# Patient Record
Sex: Male | Born: 1970 | Race: Black or African American | Hispanic: No | Marital: Married | Smoking: Never smoker
Health system: Southern US, Community
[De-identification: ages and names within clinical notes are randomized; demographics above are authoritative.]

## PROBLEM LIST (undated history)

## (undated) DIAGNOSIS — Z8709 Personal history of other diseases of the respiratory system: Secondary | ICD-10-CM

## (undated) DIAGNOSIS — I10 Essential (primary) hypertension: Secondary | ICD-10-CM

## (undated) DIAGNOSIS — IMO0001 Reserved for inherently not codable concepts without codable children: Secondary | ICD-10-CM

## (undated) DIAGNOSIS — M2550 Pain in unspecified joint: Secondary | ICD-10-CM

## (undated) DIAGNOSIS — Z8739 Personal history of other diseases of the musculoskeletal system and connective tissue: Secondary | ICD-10-CM

## (undated) DIAGNOSIS — M1990 Unspecified osteoarthritis, unspecified site: Secondary | ICD-10-CM

## (undated) DIAGNOSIS — I509 Heart failure, unspecified: Secondary | ICD-10-CM

## (undated) HISTORY — PX: TRICEPS TENDON REPAIR: SHX2577

## (undated) HISTORY — DX: Heart failure, unspecified: I50.9

## (undated) HISTORY — DX: Essential (primary) hypertension: I10

## (undated) HISTORY — DX: Reserved for inherently not codable concepts without codable children: IMO0001

---

## 2000-04-27 ENCOUNTER — Encounter: Admission: RE | Admit: 2000-04-27 | Discharge: 2000-04-27 | Payer: Self-pay | Admitting: *Deleted

## 2000-04-27 ENCOUNTER — Encounter: Payer: Self-pay | Admitting: *Deleted

## 2013-10-17 ENCOUNTER — Encounter (HOSPITAL_COMMUNITY): Payer: Self-pay

## 2013-10-18 ENCOUNTER — Encounter (HOSPITAL_COMMUNITY): Payer: Self-pay

## 2013-10-19 ENCOUNTER — Encounter: Payer: Self-pay | Admitting: Interventional Cardiology

## 2014-07-23 ENCOUNTER — Ambulatory Visit: Payer: Self-pay | Admitting: Interventional Cardiology

## 2014-08-08 ENCOUNTER — Encounter: Payer: Self-pay | Admitting: Cardiology

## 2014-08-08 ENCOUNTER — Ambulatory Visit: Payer: Self-pay | Admitting: Interventional Cardiology

## 2014-08-08 DIAGNOSIS — E669 Obesity, unspecified: Secondary | ICD-10-CM

## 2014-08-08 DIAGNOSIS — I10 Essential (primary) hypertension: Secondary | ICD-10-CM | POA: Insufficient documentation

## 2014-08-08 DIAGNOSIS — I509 Heart failure, unspecified: Secondary | ICD-10-CM | POA: Insufficient documentation

## 2014-08-31 ENCOUNTER — Encounter: Payer: Self-pay | Admitting: *Deleted

## 2014-12-18 ENCOUNTER — Encounter: Payer: Self-pay | Admitting: Interventional Cardiology

## 2015-08-19 ENCOUNTER — Other Ambulatory Visit: Payer: Self-pay | Admitting: *Deleted

## 2015-08-19 ENCOUNTER — Ambulatory Visit: Payer: Self-pay | Admitting: Interventional Cardiology

## 2015-08-21 ENCOUNTER — Encounter: Payer: Self-pay | Admitting: Interventional Cardiology

## 2016-08-05 ENCOUNTER — Ambulatory Visit: Payer: Self-pay | Admitting: Orthopedic Surgery

## 2016-08-20 NOTE — Pre-Procedure Instructions (Signed)
Andre Hoffman  08/20/2016     No Pharmacies Listed   Your procedure is scheduled on Mon, Sept 11 @ 7:30 AM  Report to [facility] Admitting at 5:30 AM  Call this number if you have problems the morning of surgery:  [phone]   Remember:  Do not eat food or drink liquids after midnight.  Take these medicines the morning of surgery with A SIP OF WATER Carvedilol(Coreg)              Stop taking your Aspirin along with any Vitamins or Herbal Medications. No Goody's,BC's,Aleve,Advil,Motrin,Ibuprofen,Fish Oil,or any Herbal Medications.    Do not wear jewelry.  Do not wear lotions, powders, or colognes, or deoderant.  Men may shave face and neck.  Do not bring valuables to the hospital.  [facility] is not responsible for any belongings or valuables.  Contacts, dentures or bridgework may not be worn into surgery.  Leave your suitcase in the car.  After surgery it may be brought to your room.  For patients admitted to the hospital, discharge time will be determined by your treatment team.  Patients discharged the day of surgery will not be allowed to drive home.    [facility]  Before surgery, you can play an important role.  Because skin is not sterile, your skin needs to be as free of germs as possible.  You can reduce the number of germs on you skin by washing with CHG (chlorahexidine gluconate) soap before surgery.  CHG is an antiseptic cleaner which kills germs and bonds with the skin to continue killing germs even after washing.  Please DO NOT use if you have an allergy to CHG or antibacterial soaps.  If your skin becomes reddened/irritated stop using the CHG and inform your nurse when you arrive at Short Stay.  Do not shave (including legs and underarms) for at least 48 hours prior to the first CHG shower.  You may shave your face.  Please follow these instructions carefully:   1.  Shower with CHG Soap the night before  surgery and the                                morning of Surgery.  2.  If you choose to wash your hair, wash your hair first as usual with your       normal shampoo.  3.  After you shampoo, rinse your hair and body thoroughly to remove the                      Shampoo.  4.  Use CHG as you would any other liquid soap.  You can apply chg directly       to the skin and wash gently with scrungie or a clean washcloth.  5.  Apply the CHG Soap to your body ONLY FROM THE NECK DOWN.        Do not use on open wounds or open sores.  Avoid contact with your eyes,       ears, mouth and genitals (private parts).  Wash genitals (private parts)       with your normal soap.  6.  Wash thoroughly, paying special attention to the area where your surgery        will be performed.  7.  Thoroughly rinse your body with warm water from  the neck down.  8.  DO NOT shower/wash with your normal soap after using and rinsing off       the CHG Soap.  9.  Pat yourself dry with a clean towel.            10.  Wear clean pajamas.            11.  Place clean sheets on your bed the night of your first shower and do not        sleep with pets.  Day of Surgery  Do not apply any lotions/deoderants the morning of surgery.  Please wear clean clothes to the hospital/surgery center.   Please read over the following fact sheets that you were given. Pain Booklet, MRSA Information and Surgical Site Infection Prevention

## 2016-08-21 ENCOUNTER — Encounter (HOSPITAL_COMMUNITY)
Admission: RE | Admit: 2016-08-21 | Discharge: 2016-08-21 | Disposition: A | Discharge status: Home / Self Care | Payer: 59 | Source: Ambulatory Visit | Attending: Orthopedic Surgery | Admitting: Orthopedic Surgery

## 2016-08-21 ENCOUNTER — Encounter (HOSPITAL_COMMUNITY): Payer: Self-pay

## 2016-08-21 ENCOUNTER — Other Ambulatory Visit: Payer: Self-pay

## 2016-08-21 DIAGNOSIS — M1612 Unilateral primary osteoarthritis, left hip: Secondary | ICD-10-CM | POA: Insufficient documentation

## 2016-08-21 DIAGNOSIS — R9431 Abnormal electrocardiogram [ECG] [EKG]: Secondary | ICD-10-CM | POA: Insufficient documentation

## 2016-08-21 DIAGNOSIS — Z01812 Encounter for preprocedural laboratory examination: Secondary | ICD-10-CM | POA: Diagnosis not present

## 2016-08-21 DIAGNOSIS — Z01818 Encounter for other preprocedural examination: Secondary | ICD-10-CM | POA: Insufficient documentation

## 2016-08-21 DIAGNOSIS — Z0183 Encounter for blood typing: Secondary | ICD-10-CM | POA: Diagnosis not present

## 2016-08-21 HISTORY — DX: Pain in unspecified joint: M25.50

## 2016-08-21 HISTORY — DX: Personal history of other diseases of the respiratory system: Z87.09

## 2016-08-21 HISTORY — DX: Unspecified osteoarthritis, unspecified site: M19.90

## 2016-08-21 HISTORY — DX: Personal history of other diseases of the musculoskeletal system and connective tissue: Z87.39

## 2016-08-21 LAB — CBC
HCT: 38.3 % — ABNORMAL LOW (ref 39.0–52.0)
HEMOGLOBIN: 12.7 g/dL — AB (ref 13.0–17.0)
MCH: 32.6 pg (ref 26.0–34.0)
MCHC: 33.2 g/dL (ref 30.0–36.0)
MCV: 98.2 fL (ref 78.0–100.0)
Platelets: 350 10*3/uL (ref 150–400)
RBC: 3.9 MIL/uL — AB (ref 4.22–5.81)
RDW: 13.4 % (ref 11.5–15.5)
WBC: 4.9 10*3/uL (ref 4.0–10.5)

## 2016-08-21 LAB — BASIC METABOLIC PANEL
ANION GAP: 11 (ref 5–15)
BUN: 8 mg/dL (ref 6–20)
CALCIUM: 9.2 mg/dL (ref 8.9–10.3)
CHLORIDE: 107 mmol/L (ref 101–111)
CO2: 22 mmol/L (ref 22–32)
CREATININE: 0.99 mg/dL (ref 0.61–1.24)
GFR calc non Af Amer: >60 mL/min (ref >60)
GLUCOSE: 118 mg/dL — AB (ref 65–99)
Potassium: 3.6 mmol/L (ref 3.5–5.1)
Sodium: 140 mmol/L (ref 135–145)

## 2016-08-21 LAB — SURGICAL PCR SCREEN
MRSA, PCR: NEGATIVE
STAPHYLOCOCCUS AUREUS: NEGATIVE

## 2016-08-21 LAB — TYPE AND SCREEN
ABO/RH(D): O POS
ANTIBODY SCREEN: NEGATIVE

## 2016-08-21 LAB — ABO/RH: ABO/RH(D): O POS

## 2016-08-21 MED ORDER — CHLORHEXIDINE GLUCONATE 4 % EX LIQD: 60.0000 mL | Freq: Once | CUTANEOUS | Status: DC

## 2016-08-21 NOTE — Progress Notes (Signed)
Saw a cardiologist in 2014 d/t edema.Only saw one time-Dr.Varanasi  Medical Md is Dr.Husain  Stress test denies   Echo report in epic from 2014  Heart cath denies  EKG denies in past yr  CXR denies in past yr

## 2016-08-25 ENCOUNTER — Ambulatory Visit: Payer: Self-pay | Admitting: Orthopedic Surgery

## 2016-08-25 NOTE — H&P (Signed)
TOTAL HIP ADMISSION H&P  Patient is admitted for left total hip arthroplasty.  Subjective:  Chief Complaint: left hip pain  HPI: Andre Hoffman, 45 y.o. male, has a history of pain and functional disability in the left hip(s) due to arthritis and patient has failed non-surgical conservative treatments for greater than 12 weeks to include NSAID's and/or analgesics, flexibility and strengthening excercises, use of assistive devices, weight reduction as appropriate and activity modification.  Onset of symptoms was gradual starting 2 years ago with rapidlly worsening course since that time.The patient noted no past surgery on the left hip(s).  Patient currently rates pain in the left hip at 10 out of 10 with activity. Patient has night pain, worsening of pain with activity and weight bearing, pain that interfers with activities of daily living, pain with passive range of motion and crepitus. Patient has evidence of subchondral cysts, subchondral sclerosis, periarticular osteophytes and joint space narrowing by imaging studies. This condition presents safety issues increasing the risk of falls. There is no current active infection.  Patient Active Problem List   Diagnosis Date Noted  . Essential hypertension, benign 08/08/2014  . Obesity, unspecified 08/08/2014  . Congestive heart failure, unspecified 08/08/2014   Past Medical History:  Diagnosis Date  . Arthritis   . CHF (congestive heart failure) (HCC)    Echo- 2014- EF 40-45%; mild LVH.Takes Lasix daily  . History of bronchitis    3 yrs ago  . History of gout    only once   . Hypertension    takes Coreg and Losartan daily  . Joint pain   . White coat hypertension     Past Surgical History:  Procedure Laterality Date  . TRICEPS TENDON REPAIR Bilateral      (Not in a hospital admission) Allergies  Allergen Reactions  . Lisinopril Swelling  . Penicillins Swelling    Social History  Substance Use Topics  . Smoking status: Never  Smoker  . Smokeless tobacco: Never Used  . Alcohol use Yes     Comment: occ beer    Family History  Problem Relation Age of Onset  . Other Father     MVA  . Hypertension Paternal Grandfather   . Other Paternal Grandfather     cardiac arrest  . Diabetes Paternal Grandfather   . Heart attack Paternal Grandfather      Review of Systems  Constitutional: Negative.   HENT: Negative.   Eyes: Negative.   Respiratory: Negative.   Cardiovascular: Negative.   Gastrointestinal: Negative.   Genitourinary: Negative.   Musculoskeletal: Positive for joint pain.  Skin: Negative.   Neurological: Negative.   Endo/Heme/Allergies: Negative.   Psychiatric/Behavioral: Negative.     Objective:  Physical Exam  Vitals reviewed. Constitutional: He is oriented to person, place, and time. He appears well-developed and well-nourished.  HENT:  Head: Normocephalic and atraumatic.  Eyes: Conjunctivae and EOM are normal. Pupils are equal, round, and reactive to light.  Neck: Normal range of motion. Neck supple.  Cardiovascular: Normal rate, regular rhythm and intact distal pulses.   Respiratory: Effort normal. No respiratory distress.  GI: Soft. Bowel sounds are normal. He exhibits no distension.  Genitourinary:  Genitourinary Comments: deferred  Musculoskeletal:       Left hip: He exhibits decreased range of motion, decreased strength and crepitus.  Neurological: He is alert and oriented to person, place, and time. He has normal reflexes.  Skin: Skin is warm and dry.  Psychiatric: He has a normal mood and  affect. His behavior is normal. Judgment and thought content normal.    Vital signs in last 24 hours: @VSRANGES @  Labs:   Estimated body mass index is 34.16 kg/m as calculated from the following:   Height as of 08/21/16: 6\' 3"  (1.905 m).   Weight as of 08/21/16: 124 kg (273 lb 5 oz).   Imaging Review Plain radiographs demonstrate severe degenerative joint disease of the left hip(s). The  bone quality appears to be adequate for age and reported activity level.  Assessment/Plan:  End stage arthritis, left hip(s)  The patient history, physical examination, clinical judgement of the provider and imaging studies are consistent with end stage degenerative joint disease of the left hip(s) and total hip arthroplasty is deemed medically necessary. The treatment options including medical management, injection therapy, arthroscopy and arthroplasty were discussed at length. The risks and benefits of total hip arthroplasty were presented and reviewed. The risks due to aseptic loosening, infection, stiffness, dislocation/subluxation,  thromboembolic complications and other imponderables were discussed.  The patient acknowledged the explanation, agreed to proceed with the plan and consent was signed. Patient is being admitted for inpatient treatment for surgery, pain control, PT, OT, prophylactic antibiotics, VTE prophylaxis, progressive ambulation and ADL's and discharge planning.The patient is planning to be discharged home with home health services

## 2016-08-28 MED ORDER — ACETAMINOPHEN 10 MG/ML IV SOLN
1000.0000 mg | INTRAVENOUS | Status: AC
  Administered 2016-08-31: 1000 mg via INTRAVENOUS

## 2016-08-28 MED ORDER — TRANEXAMIC ACID 1000 MG/10ML IV SOLN
1000.0000 mg | INTRAVENOUS | Status: AC
  Administered 2016-08-31: 1000 mg via INTRAVENOUS
  Filled 2016-08-28: qty 10

## 2016-08-28 MED ORDER — CLINDAMYCIN PHOSPHATE 900 MG/50ML IV SOLN
900.0000 mg | INTRAVENOUS | Status: AC
Start: 2016-08-31 — End: 2016-08-31
  Administered 2016-08-31: 900 mg via INTRAVENOUS
  Filled 2016-08-28: qty 50

## 2016-08-30 NOTE — Anesthesia Preprocedure Evaluation (Addendum)
Anesthesia Evaluation  Patient identified by MRN, date of birth, ID band Patient awake    Reviewed: Allergy & Precautions, NPO status , Patient's Chart, lab work & pertinent test results, reviewed documented beta blocker date and time   Airway Mallampati: I  TM Distance: >3 FB Neck ROM: Full    Dental  (+)    Pulmonary neg pulmonary ROS,    breath sounds clear to auscultation       Cardiovascular hypertension, Pt. on medications and Pt. on home beta blockers (-) angina+CHF (EF 40-45% on TTE 04/18/2013)  (-) Past MI and (-) Cardiac Stents  Rhythm:Regular Rate:Normal     Neuro/Psych    GI/Hepatic negative GI ROS, Neg liver ROS,   Endo/Other  negative endocrine ROS  Renal/GU negative Renal ROS     Musculoskeletal  (+) Arthritis ,   Abdominal (+) + obese,   Peds  Hematology negative hematology ROS (+)   Anesthesia Other Findings gout  Reproductive/Obstetrics                            Anesthesia Physical Anesthesia Plan  ASA: III  Anesthesia Plan: Spinal   Post-op Pain Management:    Induction: Intravenous  Airway Management Planned: Natural Airway and Simple Face Mask  Additional Equipment:   Intra-op Plan:   Post-operative Plan:   Informed Consent: I have reviewed the patients History and Physical, chart, labs and discussed the procedure including the risks, benefits and alternatives for the proposed anesthesia with the patient or authorized representative who has indicated his/her understanding and acceptance.   Dental advisory given  Plan Discussed with:   Anesthesia Plan Comments: (I have discussed risks of neuraxial anesthesia including but not limited to infection, bleeding, nerve injury, back pain, headache, seizures, and failure of block. Patient denies bleeding disorders and is not currently anticoagulated. Labs have been reviewed. Risks and benefits discussed. Discussed  possible backup GA. All patient's questions answered.   Platelets 350)       Anesthesia Quick Evaluation

## 2016-08-31 ENCOUNTER — Encounter (HOSPITAL_COMMUNITY)
Admission: RE | Disposition: A | Discharge status: Home Health | Payer: Self-pay | Source: Ambulatory Visit | Attending: Orthopedic Surgery

## 2016-08-31 ENCOUNTER — Inpatient Hospital Stay (HOSPITAL_COMMUNITY): Payer: 59 | Admitting: Anesthesiology

## 2016-08-31 ENCOUNTER — Encounter (HOSPITAL_COMMUNITY): Payer: Self-pay | Admitting: *Deleted

## 2016-08-31 ENCOUNTER — Inpatient Hospital Stay (HOSPITAL_COMMUNITY): Payer: 59

## 2016-08-31 ENCOUNTER — Inpatient Hospital Stay (HOSPITAL_COMMUNITY)
Admission: RE | Admit: 2016-08-31 | Discharge: 2016-09-02 | DRG: 470 | Disposition: A | Discharge status: Home Health | Payer: 59 | Source: Ambulatory Visit | Attending: Orthopedic Surgery | Admitting: Orthopedic Surgery

## 2016-08-31 DIAGNOSIS — I509 Heart failure, unspecified: Secondary | ICD-10-CM | POA: Diagnosis present

## 2016-08-31 DIAGNOSIS — M1612 Unilateral primary osteoarthritis, left hip: Principal | ICD-10-CM | POA: Diagnosis present

## 2016-08-31 DIAGNOSIS — Z09 Encounter for follow-up examination after completed treatment for conditions other than malignant neoplasm: Secondary | ICD-10-CM

## 2016-08-31 DIAGNOSIS — I110 Hypertensive heart disease with heart failure: Secondary | ICD-10-CM | POA: Diagnosis present

## 2016-08-31 DIAGNOSIS — E669 Obesity, unspecified: Secondary | ICD-10-CM | POA: Diagnosis present

## 2016-08-31 DIAGNOSIS — Z79899 Other long term (current) drug therapy: Secondary | ICD-10-CM

## 2016-08-31 DIAGNOSIS — Z419 Encounter for procedure for purposes other than remedying health state, unspecified: Secondary | ICD-10-CM

## 2016-08-31 DIAGNOSIS — Z6834 Body mass index (BMI) 34.0-34.9, adult: Secondary | ICD-10-CM | POA: Diagnosis not present

## 2016-08-31 HISTORY — PX: TOTAL HIP ARTHROPLASTY: SHX124

## 2016-08-31 SURGERY — ARTHROPLASTY, HIP, TOTAL, ANTERIOR APPROACH
Anesthesia: Spinal | Site: Hip | Laterality: Left

## 2016-08-31 MED ORDER — ONDANSETRON HCL 4 MG/2ML IJ SOLN
INTRAMUSCULAR | Status: DC | PRN
  Administered 2016-08-31: 4 mg via INTRAVENOUS

## 2016-08-31 MED ORDER — SUGAMMADEX SODIUM 500 MG/5ML IV SOLN
INTRAVENOUS | Status: AC
  Filled 2016-08-31: qty 5

## 2016-08-31 MED ORDER — DOCUSATE SODIUM 100 MG PO CAPS
100.0000 mg | ORAL_CAPSULE | Freq: Two times a day (BID) | ORAL | Status: DC
  Administered 2016-08-31 – 2016-09-02 (×5): 100 mg via ORAL
  Filled 2016-08-31 (×5): qty 1

## 2016-08-31 MED ORDER — HYDROMORPHONE HCL 1 MG/ML IJ SOLN
INTRAMUSCULAR | Status: AC
  Filled 2016-08-31: qty 1

## 2016-08-31 MED ORDER — PROMETHAZINE HCL 25 MG/ML IJ SOLN: 6.2500 mg | INTRAMUSCULAR | Status: DC | PRN

## 2016-08-31 MED ORDER — GLYCOPYRROLATE 0.2 MG/ML IJ SOLN
INTRAMUSCULAR | Status: DC | PRN
  Administered 2016-08-31: 0.2 mg via INTRAVENOUS

## 2016-08-31 MED ORDER — HYDROCODONE-ACETAMINOPHEN 5-325 MG PO TABS
1.0000 | ORAL_TABLET | ORAL | Status: DC | PRN
  Administered 2016-08-31 – 2016-09-02 (×12): 2 via ORAL
  Filled 2016-08-31 (×11): qty 2

## 2016-08-31 MED ORDER — CLINDAMYCIN PHOSPHATE 600 MG/50ML IV SOLN
600.0000 mg | Freq: Four times a day (QID) | INTRAVENOUS | Status: AC
  Administered 2016-08-31 (×2): 600 mg via INTRAVENOUS
  Filled 2016-08-31 (×2): qty 50

## 2016-08-31 MED ORDER — MENTHOL 3 MG MT LOZG: 1.0000 | LOZENGE | OROMUCOSAL | Status: DC | PRN

## 2016-08-31 MED ORDER — ACETAMINOPHEN 325 MG PO TABS: 650.0000 mg | ORAL_TABLET | Freq: Four times a day (QID) | ORAL | Status: DC | PRN

## 2016-08-31 MED ORDER — FENTANYL CITRATE (PF) 100 MCG/2ML IJ SOLN
25.0000 ug | INTRAMUSCULAR | Status: DC | PRN
  Administered 2016-08-31 (×2): 50 ug via INTRAVENOUS

## 2016-08-31 MED ORDER — MIDAZOLAM HCL 2 MG/2ML IJ SOLN
INTRAMUSCULAR | Status: AC
  Filled 2016-08-31: qty 2

## 2016-08-31 MED ORDER — POLYETHYLENE GLYCOL 3350 17 G PO PACK: 17.0000 g | PACK | Freq: Every day | ORAL | Status: DC | PRN

## 2016-08-31 MED ORDER — KETOROLAC TROMETHAMINE 30 MG/ML IJ SOLN
INTRAMUSCULAR | Status: AC
  Filled 2016-08-31: qty 1

## 2016-08-31 MED ORDER — DIPHENHYDRAMINE HCL 12.5 MG/5ML PO ELIX
12.5000 mg | ORAL_SOLUTION | ORAL | Status: DC | PRN
  Administered 2016-09-01: 25 mg via ORAL
  Filled 2016-08-31: qty 10

## 2016-08-31 MED ORDER — FLEET ENEMA 7-19 GM/118ML RE ENEM: 1.0000 | ENEMA | Freq: Once | RECTAL | Status: DC | PRN

## 2016-08-31 MED ORDER — FUROSEMIDE 40 MG PO TABS
40.0000 mg | ORAL_TABLET | Freq: Every day | ORAL | Status: DC
  Administered 2016-08-31 – 2016-09-02 (×3): 40 mg via ORAL
  Filled 2016-08-31 (×3): qty 1

## 2016-08-31 MED ORDER — SENNA 8.6 MG PO TABS
2.0000 | ORAL_TABLET | Freq: Every day | ORAL | Status: DC
  Administered 2016-08-31 – 2016-09-01 (×2): 17.2 mg via ORAL
  Filled 2016-08-31 (×2): qty 2

## 2016-08-31 MED ORDER — PROPOFOL 10 MG/ML IV BOLUS
INTRAVENOUS | Status: DC | PRN
  Administered 2016-08-31: 200 mg via INTRAVENOUS

## 2016-08-31 MED ORDER — PHENOL 1.4 % MT LIQD: 1.0000 | OROMUCOSAL | Status: DC | PRN

## 2016-08-31 MED ORDER — FENTANYL CITRATE (PF) 100 MCG/2ML IJ SOLN
INTRAMUSCULAR | Status: AC
  Filled 2016-08-31: qty 2

## 2016-08-31 MED ORDER — LIDOCAINE 2% (20 MG/ML) 5 ML SYRINGE
INTRAMUSCULAR | Status: AC
  Filled 2016-08-31: qty 5

## 2016-08-31 MED ORDER — METHOCARBAMOL 1000 MG/10ML IJ SOLN
500.0000 mg | Freq: Four times a day (QID) | INTRAMUSCULAR | Status: DC | PRN
  Filled 2016-08-31: qty 5

## 2016-08-31 MED ORDER — LACTATED RINGERS IV SOLN
INTRAVENOUS | Status: DC | PRN
  Administered 2016-08-31 (×2): via INTRAVENOUS

## 2016-08-31 MED ORDER — CARVEDILOL 12.5 MG PO TABS
12.5000 mg | ORAL_TABLET | Freq: Two times a day (BID) | ORAL | Status: DC
  Administered 2016-08-31 – 2016-09-02 (×4): 12.5 mg via ORAL
  Filled 2016-08-31 (×4): qty 1

## 2016-08-31 MED ORDER — METHOCARBAMOL 500 MG PO TABS
500.0000 mg | ORAL_TABLET | Freq: Four times a day (QID) | ORAL | Status: DC | PRN
  Administered 2016-08-31 – 2016-09-02 (×7): 500 mg via ORAL
  Filled 2016-08-31 (×7): qty 1

## 2016-08-31 MED ORDER — TRANEXAMIC ACID 1000 MG/10ML IV SOLN
1000.0000 mg | Freq: Once | INTRAVENOUS | Status: AC
  Administered 2016-08-31: 1000 mg via INTRAVENOUS
  Filled 2016-08-31: qty 10

## 2016-08-31 MED ORDER — SODIUM CHLORIDE 0.9 % IV SOLN
INTRAVENOUS | Status: DC
Start: 2016-08-31 — End: 2016-08-31

## 2016-08-31 MED ORDER — PROPOFOL 10 MG/ML IV BOLUS
INTRAVENOUS | Status: AC
  Filled 2016-08-31: qty 20

## 2016-08-31 MED ORDER — LIDOCAINE HCL (CARDIAC) 20 MG/ML IV SOLN
INTRAVENOUS | Status: DC | PRN
  Administered 2016-08-31: 100 mg via INTRATRACHEAL

## 2016-08-31 MED ORDER — PROPOFOL 1000 MG/100ML IV EMUL
INTRAVENOUS | Status: AC
  Filled 2016-08-31: qty 200

## 2016-08-31 MED ORDER — FENTANYL CITRATE (PF) 100 MCG/2ML IJ SOLN
INTRAMUSCULAR | Status: DC | PRN
  Administered 2016-08-31 (×4): 100 ug via INTRAVENOUS

## 2016-08-31 MED ORDER — DEXAMETHASONE SODIUM PHOSPHATE 10 MG/ML IJ SOLN
10.0000 mg | Freq: Once | INTRAMUSCULAR | Status: AC
Start: 2016-09-01 — End: 2016-09-01
  Administered 2016-09-01: 10 mg via INTRAVENOUS
  Filled 2016-08-31: qty 1

## 2016-08-31 MED ORDER — SODIUM CHLORIDE 0.9 % IR SOLN
Status: DC | PRN
  Administered 2016-08-31 (×2): 1000 mL

## 2016-08-31 MED ORDER — SUCCINYLCHOLINE CHLORIDE 200 MG/10ML IV SOSY
PREFILLED_SYRINGE | INTRAVENOUS | Status: AC
  Filled 2016-08-31: qty 10

## 2016-08-31 MED ORDER — KETOROLAC TROMETHAMINE 30 MG/ML IJ SOLN
INTRAMUSCULAR | Status: DC | PRN
  Administered 2016-08-31: 30 mg via INTRA_ARTICULAR

## 2016-08-31 MED ORDER — PHENYLEPHRINE HCL 10 MG/ML IJ SOLN
INTRAVENOUS | Status: DC | PRN
  Administered 2016-08-31: 25 ug/min via INTRAVENOUS

## 2016-08-31 MED ORDER — SUCCINYLCHOLINE CHLORIDE 20 MG/ML IJ SOLN
INTRAMUSCULAR | Status: DC | PRN
  Administered 2016-08-31: 140 mg via INTRAVENOUS

## 2016-08-31 MED ORDER — 0.9 % SODIUM CHLORIDE (POUR BTL) OPTIME
TOPICAL | Status: DC | PRN
  Administered 2016-08-31: 1000 mL

## 2016-08-31 MED ORDER — FENTANYL CITRATE (PF) 100 MCG/2ML IJ SOLN
INTRAMUSCULAR | Status: AC
Start: 2016-08-31 — End: 2016-08-31
  Filled 2016-08-31: qty 2

## 2016-08-31 MED ORDER — SODIUM CHLORIDE 0.9 % IV SOLN
INTRAVENOUS | Status: DC
  Administered 2016-08-31: 16:00:00 via INTRAVENOUS
  Administered 2016-09-01: 1000 mL via INTRAVENOUS

## 2016-08-31 MED ORDER — METOCLOPRAMIDE HCL 5 MG/ML IJ SOLN: 5.0000 mg | Freq: Three times a day (TID) | INTRAMUSCULAR | Status: DC | PRN

## 2016-08-31 MED ORDER — FENTANYL CITRATE (PF) 100 MCG/2ML IJ SOLN
INTRAMUSCULAR | Status: AC
  Administered 2016-08-31: 50 ug via INTRAVENOUS
  Filled 2016-08-31: qty 2

## 2016-08-31 MED ORDER — HYDROMORPHONE HCL 1 MG/ML IJ SOLN
INTRAMUSCULAR | Status: DC | PRN
  Administered 2016-08-31 (×2): 0.5 mg via INTRAVENOUS

## 2016-08-31 MED ORDER — ROCURONIUM 10MG/ML (10ML) SYRINGE FOR MEDFUSION PUMP - OPTIME
INTRAVENOUS | Status: DC | PRN
  Administered 2016-08-31: 10 mg via INTRAVENOUS
  Administered 2016-08-31 (×2): 50 mg via INTRAVENOUS

## 2016-08-31 MED ORDER — ACETAMINOPHEN 10 MG/ML IV SOLN
INTRAVENOUS | Status: AC
  Filled 2016-08-31: qty 100

## 2016-08-31 MED ORDER — PNEUMOCOCCAL VAC POLYVALENT 25 MCG/0.5ML IJ INJ
0.5000 mL | INJECTION | INTRAMUSCULAR | Status: AC
  Administered 2016-09-02: 0.5 mL via INTRAMUSCULAR
  Filled 2016-08-31: qty 0.5

## 2016-08-31 MED ORDER — ONDANSETRON HCL 4 MG PO TABS: 4.0000 mg | ORAL_TABLET | Freq: Four times a day (QID) | ORAL | Status: DC | PRN

## 2016-08-31 MED ORDER — POVIDONE-IODINE 7.5 % EX SOLN
CUTANEOUS | Status: DC | PRN
  Administered 2016-08-31: 1 via TOPICAL

## 2016-08-31 MED ORDER — ASPIRIN 81 MG PO CHEW
81.0000 mg | CHEWABLE_TABLET | Freq: Two times a day (BID) | ORAL | Status: DC
  Administered 2016-08-31 – 2016-09-02 (×4): 81 mg via ORAL
  Filled 2016-08-31 (×4): qty 1

## 2016-08-31 MED ORDER — METOCLOPRAMIDE HCL 5 MG PO TABS: 5.0000 mg | ORAL_TABLET | Freq: Three times a day (TID) | ORAL | Status: DC | PRN

## 2016-08-31 MED ORDER — LOSARTAN POTASSIUM 50 MG PO TABS
100.0000 mg | ORAL_TABLET | Freq: Every day | ORAL | Status: DC
  Administered 2016-08-31 – 2016-09-02 (×3): 100 mg via ORAL
  Filled 2016-08-31 (×3): qty 2

## 2016-08-31 MED ORDER — PHENYLEPHRINE HCL 10 MG/ML IJ SOLN
INTRAMUSCULAR | Status: DC | PRN
  Administered 2016-08-31 (×3): 120 ug via INTRAVENOUS

## 2016-08-31 MED ORDER — ROCURONIUM BROMIDE 10 MG/ML (PF) SYRINGE
PREFILLED_SYRINGE | INTRAVENOUS | Status: AC
  Filled 2016-08-31: qty 10

## 2016-08-31 MED ORDER — SUGAMMADEX SODIUM 200 MG/2ML IV SOLN
INTRAVENOUS | Status: DC | PRN
  Administered 2016-08-31: 500 mg via INTRAVENOUS

## 2016-08-31 MED ORDER — PHENYLEPHRINE 40 MCG/ML (10ML) SYRINGE FOR IV PUSH (FOR BLOOD PRESSURE SUPPORT)
PREFILLED_SYRINGE | INTRAVENOUS | Status: AC
  Filled 2016-08-31: qty 10

## 2016-08-31 MED ORDER — POVIDONE-IODINE 10 % EX SWAB: 2.0000 "application " | Freq: Once | CUTANEOUS | Status: DC

## 2016-08-31 MED ORDER — SODIUM CHLORIDE 0.9 % IJ SOLN
INTRAMUSCULAR | Status: DC | PRN
  Administered 2016-08-31: 30 mL

## 2016-08-31 MED ORDER — GLYCOPYRROLATE 0.2 MG/ML IV SOSY
PREFILLED_SYRINGE | INTRAVENOUS | Status: AC
  Filled 2016-08-31: qty 3

## 2016-08-31 MED ORDER — HYDROCODONE-ACETAMINOPHEN 5-325 MG PO TABS
ORAL_TABLET | ORAL | Status: AC
  Administered 2016-08-31: 2 via ORAL
  Filled 2016-08-31: qty 2

## 2016-08-31 MED ORDER — POTASSIUM CHLORIDE CRYS ER 10 MEQ PO TBCR
10.0000 meq | EXTENDED_RELEASE_TABLET | Freq: Every day | ORAL | Status: DC
  Administered 2016-08-31 – 2016-09-02 (×3): 10 meq via ORAL
  Filled 2016-08-31 (×3): qty 1

## 2016-08-31 MED ORDER — MIDAZOLAM HCL 5 MG/5ML IJ SOLN
INTRAMUSCULAR | Status: DC | PRN
  Administered 2016-08-31 (×2): 1 mg via INTRAVENOUS
  Administered 2016-08-31: 2 mg via INTRAVENOUS

## 2016-08-31 MED ORDER — DEXMEDETOMIDINE HCL 200 MCG/2ML IV SOLN
INTRAVENOUS | Status: DC | PRN
  Administered 2016-08-31 (×2): 20 ug via INTRAVENOUS

## 2016-08-31 MED ORDER — HYDROMORPHONE HCL 1 MG/ML IJ SOLN
0.5000 mg | INTRAMUSCULAR | Status: DC | PRN
  Administered 2016-08-31: 0.5 mg via INTRAVENOUS
  Filled 2016-08-31: qty 1

## 2016-08-31 MED ORDER — SORBITOL 70 % SOLN: 30.0000 mL | Freq: Every day | Status: DC | PRN

## 2016-08-31 MED ORDER — ONDANSETRON HCL 4 MG/2ML IJ SOLN: 4.0000 mg | Freq: Four times a day (QID) | INTRAMUSCULAR | Status: DC | PRN

## 2016-08-31 MED ORDER — ONDANSETRON HCL 4 MG/2ML IJ SOLN
INTRAMUSCULAR | Status: AC
  Filled 2016-08-31: qty 2

## 2016-08-31 MED ORDER — DEXAMETHASONE SODIUM PHOSPHATE 10 MG/ML IJ SOLN
INTRAMUSCULAR | Status: DC | PRN
  Administered 2016-08-31: 10 mg via INTRAVENOUS

## 2016-08-31 MED ORDER — DEXAMETHASONE SODIUM PHOSPHATE 10 MG/ML IJ SOLN
INTRAMUSCULAR | Status: AC
  Filled 2016-08-31: qty 1

## 2016-08-31 MED ORDER — BUPIVACAINE-EPINEPHRINE (PF) 0.5% -1:200000 IJ SOLN
INTRAMUSCULAR | Status: AC
  Filled 2016-08-31: qty 30

## 2016-08-31 MED ORDER — ACETAMINOPHEN 650 MG RE SUPP: 650.0000 mg | Freq: Four times a day (QID) | RECTAL | Status: DC | PRN

## 2016-08-31 MED ORDER — BUPIVACAINE-EPINEPHRINE 0.5% -1:200000 IJ SOLN
INTRAMUSCULAR | Status: DC | PRN
  Administered 2016-08-31: 30 mL

## 2016-08-31 MED ORDER — METHOCARBAMOL 500 MG PO TABS
ORAL_TABLET | ORAL | Status: AC
  Administered 2016-08-31: 500 mg via ORAL
  Filled 2016-08-31: qty 1

## 2016-08-31 SURGICAL SUPPLY — 55 items
ALCOHOL ISOPROPYL (RUBBING) (MISCELLANEOUS) ×3 IMPLANT
BLADE SURG ROTATE 9660 (MISCELLANEOUS) IMPLANT
CAPT HIP TOTAL 2 ×3 IMPLANT
CHLORAPREP W/TINT 26ML (MISCELLANEOUS) ×3 IMPLANT
COVER SURGICAL LIGHT HANDLE (MISCELLANEOUS) ×3 IMPLANT
DERMABOND ADVANCED (GAUZE/BANDAGES/DRESSINGS) ×4
DERMABOND ADVANCED .7 DNX12 (GAUZE/BANDAGES/DRESSINGS) ×2 IMPLANT
DRAPE C-ARM 42X72 X-RAY (DRAPES) ×3 IMPLANT
DRAPE IMP U-DRAPE 54X76 (DRAPES) ×6 IMPLANT
DRAPE STERI IOBAN 125X83 (DRAPES) ×3 IMPLANT
DRAPE U-SHAPE 47X51 STRL (DRAPES) ×9 IMPLANT
DRSG AQUACEL AG ADV 3.5X10 (GAUZE/BANDAGES/DRESSINGS) ×3 IMPLANT
ELECT BLADE 4.0 EZ CLEAN MEGAD (MISCELLANEOUS) ×3
ELECT REM PT RETURN 9FT ADLT (ELECTROSURGICAL) ×3
ELECTRODE BLDE 4.0 EZ CLN MEGD (MISCELLANEOUS) ×1 IMPLANT
ELECTRODE REM PT RTRN 9FT ADLT (ELECTROSURGICAL) ×1 IMPLANT
EVACUATOR 1/8 PVC DRAIN (DRAIN) IMPLANT
GLOVE BIO SURGEON STRL SZ8.5 (GLOVE) ×6 IMPLANT
GLOVE BIOGEL PI IND STRL 8.5 (GLOVE) ×1 IMPLANT
GLOVE BIOGEL PI INDICATOR 8.5 (GLOVE) ×2
GOWN STRL REUS W/ TWL LRG LVL3 (GOWN DISPOSABLE) ×2 IMPLANT
GOWN STRL REUS W/TWL 2XL LVL3 (GOWN DISPOSABLE) ×3 IMPLANT
GOWN STRL REUS W/TWL LRG LVL3 (GOWN DISPOSABLE) ×4
HANDPIECE INTERPULSE COAX TIP (DISPOSABLE) ×2
HOOD PEEL AWAY FACE SHEILD DIS (HOOD) ×6 IMPLANT
KIT BASIN OR (CUSTOM PROCEDURE TRAY) ×3 IMPLANT
KIT ROOM TURNOVER OR (KITS) ×3 IMPLANT
MANIFOLD NEPTUNE II (INSTRUMENTS) ×3 IMPLANT
MARKER SKIN DUAL TIP RULER LAB (MISCELLANEOUS) ×6 IMPLANT
NEEDLE SPNL 18GX3.5 QUINCKE PK (NEEDLE) ×3 IMPLANT
NS IRRIG 1000ML POUR BTL (IV SOLUTION) ×3 IMPLANT
PACK TOTAL JOINT (CUSTOM PROCEDURE TRAY) ×3 IMPLANT
PACK UNIVERSAL I (CUSTOM PROCEDURE TRAY) ×3 IMPLANT
PAD ARMBOARD 7.5X6 YLW CONV (MISCELLANEOUS) ×6 IMPLANT
SAW OSC TIP CART 19.5X105X1.3 (SAW) ×3 IMPLANT
SEALER BIPOLAR AQUA 6.0 (INSTRUMENTS) ×3 IMPLANT
SET HNDPC FAN SPRY TIP SCT (DISPOSABLE) ×1 IMPLANT
SOLUTION BETADINE 4OZ (MISCELLANEOUS) ×3 IMPLANT
SPONGE LAP 18X18 X RAY DECT (DISPOSABLE) ×3 IMPLANT
SUCTION FRAZIER HANDLE 10FR (MISCELLANEOUS) ×2
SUCTION TUBE FRAZIER 10FR DISP (MISCELLANEOUS) ×1 IMPLANT
SUT ETHIBOND NAB CT1 #1 30IN (SUTURE) ×6 IMPLANT
SUT MNCRL AB 3-0 PS2 18 (SUTURE) ×3 IMPLANT
SUT MON AB 2-0 CT1 36 (SUTURE) ×3 IMPLANT
SUT VIC AB 1 CT1 27 (SUTURE) ×2
SUT VIC AB 1 CT1 27XBRD ANBCTR (SUTURE) ×1 IMPLANT
SUT VIC AB 2-0 CT1 27 (SUTURE) ×2
SUT VIC AB 2-0 CT1 TAPERPNT 27 (SUTURE) ×1 IMPLANT
SUT VLOC 180 0 24IN GS25 (SUTURE) ×3 IMPLANT
SYR 50ML LL SCALE MARK (SYRINGE) ×3 IMPLANT
TOWEL OR 17X24 6PK STRL BLUE (TOWEL DISPOSABLE) ×3 IMPLANT
TOWEL OR 17X26 10 PK STRL BLUE (TOWEL DISPOSABLE) ×3 IMPLANT
TRAY FOLEY CATH 16FR SILVER (SET/KITS/TRAYS/PACK) IMPLANT
WATER STERILE IRR 1000ML POUR (IV SOLUTION) ×9 IMPLANT
YANKAUER SUCT BULB TIP NO VENT (SUCTIONS) ×3 IMPLANT

## 2016-08-31 NOTE — Discharge Instructions (Signed)
°Dr. Cylan Borum °Joint Replacement Specialist °[facility] °[address] °[place], [place] [address] °[phone] ° ° °TOTAL HIP REPLACEMENT POSTOPERATIVE DIRECTIONS ° ° ° °Hip Rehabilitation, Guidelines Following Surgery  ° °WEIGHT BEARING °Weight bearing as tolerated with assist device (walker, cane, etc) as directed, use it as long as suggested by your surgeon or therapist, typically at least 4-6 weeks. ° °The results of a hip operation are greatly improved after range of motion and muscle strengthening exercises. Follow all safety measures which are given to protect your hip. If any of these exercises cause increased pain or swelling in your joint, decrease the amount until you are comfortable again. Then slowly increase the exercises. Call your caregiver if you have problems or questions.  ° °HOME CARE INSTRUCTIONS  °Most of the following instructions are designed to prevent the dislocation of your new hip.  °Remove items at home which could result in a fall. This includes throw rugs or furniture in walking pathways.  °Continue medications as instructed at time of discharge. °· You may have some home medications which will be placed on hold until you complete the course of blood thinner medication. °· You may start showering once you are discharged home. Do not remove your dressing. °Do not put on socks or shoes without following the instructions of your caregivers.   °Sit on chairs with arms. Use the chair arms to help push yourself up when arising.  °Arrange for the use of a toilet seat elevator so you are not sitting low.  °· Walk with walker as instructed.  °You may resume a sexual relationship in one month or when given the OK by your caregiver.  °Use walker as long as suggested by your caregivers.  °You may put full weight on your legs and walk as much as is comfortable. °Avoid periods of inactivity such as sitting longer than an hour when not asleep. This helps prevent  blood clots.  °You may return to work once you are cleared by your surgeon.  °Do not drive a car for 6 weeks or until released by your surgeon.  °Do not drive while taking narcotics.  °Wear elastic stockings for two weeks following surgery during the day but you may remove then at night.  °Make sure you keep all of your appointments after your operation with all of your doctors and caregivers. You should call the office at the above phone number and make an appointment for approximately two weeks after the date of your surgery. °Please pick up a stool softener and laxative for home use as long as you are requiring pain medications. °· ICE to the affected hip every three hours for 30 minutes at a time and then as needed for pain and swelling. Continue to use ice on the hip for pain and swelling from surgery. You may notice swelling that will progress down to the foot and ankle.  This is normal after surgery.  Elevate the leg when you are not up walking on it.   °It is important for you to complete the blood thinner medication as prescribed by your doctor. °· Continue to use the breathing machine which will help keep your temperature down.  It is common for your temperature to cycle up and down following surgery, especially at night when you are not up moving around and exerting yourself.  The breathing machine keeps your lungs expanded and your temperature down. ° °RANGE OF MOTION AND STRENGTHENING EXERCISES  °These exercises are   designed to help you keep full movement of your hip joint. Follow your caregiver's or physical therapist's instructions. Perform all exercises about fifteen times, three times per day or as directed. Exercise both hips, even if you have had only one joint replacement. These exercises can be done on a training (exercise) mat, on the floor, on a table or on a bed. Use whatever works the best and is most comfortable for you. Use music or television while you are exercising so that the exercises  are a pleasant break in your day. This will make your life better with the exercises acting as a break in routine you can look forward to.  °Lying on your back, slowly slide your foot toward your buttocks, raising your knee up off the floor. Then slowly slide your foot back down until your leg is straight again.  °Lying on your back spread your legs as far apart as you can without causing discomfort.  °Lying on your side, raise your upper leg and foot straight up from the floor as far as is comfortable. Slowly lower the leg and repeat.  °Lying on your back, tighten up the muscle in the front of your thigh (quadriceps muscles). You can do this by keeping your leg straight and trying to raise your heel off the floor. This helps strengthen the largest muscle supporting your knee.  °Lying on your back, tighten up the muscles of your buttocks both with the legs straight and with the knee bent at a comfortable angle while keeping your heel on the floor.  ° °SKILLED REHAB INSTRUCTIONS: °If the patient is transferred to a skilled rehab facility following release from the hospital, a list of the current medications will be sent to the facility for the patient to continue.  When discharged from the skilled rehab facility, please have the facility set up the patient's Home Health Physical Therapy prior to being released. Also, the skilled facility will be responsible for providing the patient with their medications at time of release from the facility to include their pain medication and their blood thinner medication. If the patient is still at the rehab facility at time of the two week follow up appointment, the skilled rehab facility will also need to assist the patient in arranging follow up appointment in our office and any transportation needs. ° °MAKE SURE YOU:  °Understand these instructions.  °Will watch your condition.  °Will get help right away if you are not doing well or get worse. ° °Pick up stool softner and  laxative for home use following surgery while on pain medications. °Do not remove your dressing. °The dressing is waterproof--it is OK to take showers. °Continue to use ice for pain and swelling after surgery. °Do not use any lotions or creams on the incision until instructed by your surgeon. °Total Hip Protocol. ° ° °

## 2016-08-31 NOTE — Anesthesia Procedure Notes (Signed)
Procedure Name: Intubation Date/Time: 08/31/2016 8:09 AM Performed by: Doyce LooseHOFFMAN, Mailynn Everly ANN Pre-anesthesia Checklist: Patient identified, Emergency Drugs available, Suction available and Patient being monitored Patient Re-evaluated:Patient Re-evaluated prior to inductionOxygen Delivery Method: Circle System Utilized Preoxygenation: Pre-oxygenation with 100% oxygen Intubation Type: IV induction Ventilation: Mask ventilation without difficulty Laryngoscope Size: Miller and 3 Grade View: Grade II Tube type: Oral Tube size: 7.5 mm Number of attempts: 1 Airway Equipment and Method: Stylet and Oral airway Placement Confirmation: ETT inserted through vocal cords under direct vision,  positive ETCO2 and breath sounds checked- equal and bilateral Secured at: 24 cm Tube secured with: Tape Dental Injury: Teeth and Oropharynx as per pre-operative assessment  Comments: Small chips noted to front incisors and canines.  Mucosa and dentition intact as pre-op after Dl.  Only bottom of arytenoids visualized but ett passed easily.

## 2016-08-31 NOTE — Interval H&P Note (Signed)
History and Physical Interval Note:  08/31/2016 7:39 AM  Andre Hoffman  has presented today for surgery, with the diagnosis of DJD Left Hip  The various methods of treatment have been discussed with the patient and family. After consideration of risks, benefits and other options for treatment, the patient has consented to  Procedure(s) with comments: LEFT TOTAL HIP ARTHROPLASTY ANTERIOR APPROACH (Left) - Requesting RNFA as a surgical intervention .  The patient's history has been reviewed, patient examined, no change in status, stable for surgery.  I have reviewed the patient's chart and labs.  Questions were answered to the patient's satisfaction.     Renard Caperton, Cloyde ReamsBrian Kenney

## 2016-08-31 NOTE — H&P (View-Only) (Signed)
TOTAL HIP ADMISSION H&P  Patient is admitted for left total hip arthroplasty.  Subjective:  Chief Complaint: left hip pain  HPI: Andre Hoffman, 45 y.o. male, has a history of pain and functional disability in the left hip(s) due to arthritis and patient has failed non-surgical conservative treatments for greater than 12 weeks to include NSAID's and/or analgesics, flexibility and strengthening excercises, use of assistive devices, weight reduction as appropriate and activity modification.  Onset of symptoms was gradual starting 2 years ago with rapidlly worsening course since that time.The patient noted no past surgery on the left hip(s).  Patient currently rates pain in the left hip at 10 out of 10 with activity. Patient has night pain, worsening of pain with activity and weight bearing, pain that interfers with activities of daily living, pain with passive range of motion and crepitus. Patient has evidence of subchondral cysts, subchondral sclerosis, periarticular osteophytes and joint space narrowing by imaging studies. This condition presents safety issues increasing the risk of falls. There is no current active infection.  Patient Active Problem List   Diagnosis Date Noted  . Essential hypertension, benign 08/08/2014  . Obesity, unspecified 08/08/2014  . Congestive heart failure, unspecified 08/08/2014   Past Medical History:  Diagnosis Date  . Arthritis   . CHF (congestive heart failure) (HCC)    Echo- 2014- EF 40-45%; mild LVH.Takes Lasix daily  . History of bronchitis    3 yrs ago  . History of gout    only once   . Hypertension    takes Coreg and Losartan daily  . Joint pain   . White coat hypertension     Past Surgical History:  Procedure Laterality Date  . TRICEPS TENDON REPAIR Bilateral      (Not in a hospital admission) Allergies  Allergen Reactions  . Lisinopril Swelling  . Penicillins Swelling    Social History  Substance Use Topics  . Smoking status: Never  Smoker  . Smokeless tobacco: Never Used  . Alcohol use Yes     Comment: occ beer    Family History  Problem Relation Age of Onset  . Other Father     MVA  . Hypertension Paternal Grandfather   . Other Paternal Grandfather     cardiac arrest  . Diabetes Paternal Grandfather   . Heart attack Paternal Grandfather      Review of Systems  Constitutional: Negative.   HENT: Negative.   Eyes: Negative.   Respiratory: Negative.   Cardiovascular: Negative.   Gastrointestinal: Negative.   Genitourinary: Negative.   Musculoskeletal: Positive for joint pain.  Skin: Negative.   Neurological: Negative.   Endo/Heme/Allergies: Negative.   Psychiatric/Behavioral: Negative.     Objective:  Physical Exam  Vitals reviewed. Constitutional: He is oriented to person, place, and time. He appears well-developed and well-nourished.  HENT:  Head: Normocephalic and atraumatic.  Eyes: Conjunctivae and EOM are normal. Pupils are equal, round, and reactive to light.  Neck: Normal range of motion. Neck supple.  Cardiovascular: Normal rate, regular rhythm and intact distal pulses.   Respiratory: Effort normal. No respiratory distress.  GI: Soft. Bowel sounds are normal. He exhibits no distension.  Genitourinary:  Genitourinary Comments: deferred  Musculoskeletal:       Left hip: He exhibits decreased range of motion, decreased strength and crepitus.  Neurological: He is alert and oriented to person, place, and time. He has normal reflexes.  Skin: Skin is warm and dry.  Psychiatric: He has a normal mood and   affect. His behavior is normal. Judgment and thought content normal.    Vital signs in last 24 hours: @VSRANGES@  Labs:   Estimated body mass index is 34.16 kg/m as calculated from the following:   Height as of 08/21/16: 6' 3" (1.905 m).   Weight as of 08/21/16: 124 kg (273 lb 5 oz).   Imaging Review Plain radiographs demonstrate severe degenerative joint disease of the left hip(s). The  bone quality appears to be adequate for age and reported activity level.  Assessment/Plan:  End stage arthritis, left hip(s)  The patient history, physical examination, clinical judgement of the provider and imaging studies are consistent with end stage degenerative joint disease of the left hip(s) and total hip arthroplasty is deemed medically necessary. The treatment options including medical management, injection therapy, arthroscopy and arthroplasty were discussed at length. The risks and benefits of total hip arthroplasty were presented and reviewed. The risks due to aseptic loosening, infection, stiffness, dislocation/subluxation,  thromboembolic complications and other imponderables were discussed.  The patient acknowledged the explanation, agreed to proceed with the plan and consent was signed. Patient is being admitted for inpatient treatment for surgery, pain control, PT, OT, prophylactic antibiotics, VTE prophylaxis, progressive ambulation and ADL's and discharge planning.The patient is planning to be discharged home with home health services 

## 2016-08-31 NOTE — Transfer of Care (Signed)
Immediate Anesthesia Transfer of Care Note  Patient: Andre Hoffman  Procedure(s) Performed: Procedure(s) with comments: LEFT TOTAL HIP ARTHROPLASTY ANTERIOR APPROACH (Left) - Requesting RNFA  Patient Location: PACU  Anesthesia Type:General  Level of Consciousness: awake and alert   Airway & Oxygen Therapy: Patient Spontanous Breathing and Patient connected to face mask oxygen  Post-op Assessment: Report given to RN and Post -op Vital signs reviewed and stable  Post vital signs: Reviewed and stable  Last Vitals:  Vitals:   08/31/16 0654 08/31/16 0658  BP: (!) 219/123 (!) 209/90  Pulse: 91 89  Resp: 20   Temp: 37.9 C     Last Pain:  Vitals:   08/31/16 0654  TempSrc: Oral  PainSc:       Patients Stated Pain Goal: 3 (08/31/16 [id number])  Complications: No apparent anesthesia complications

## 2016-08-31 NOTE — Evaluation (Signed)
Physical Therapy Evaluation Patient Details Name: Andre Hoffman MRN: [id number] DOB: [birth year] Today's Date: 08/31/2016   History of Present Illness  45 y.o. male now s/p Lt direct anterior THA. PMH: HTN, CHF.   Clinical Impression  Pt is s/p LT direct anterior THA resulting in the deficits listed below (see PT Problem List). Pt able to ambulate 12 feet with rw during initial PT session. Pt will benefit from skilled PT to increase their independence and safety with mobility to allow discharge to home with family support. Pt does report having 5 steps to enter his home and 1 flight to get to his bedroom. Pt will need to be safe with steps prior to D/C.       Follow Up Recommendations Home health PT;Supervision for mobility/OOB    Equipment Recommendations  None recommended by PT (pt reports having needed equipment)    Recommendations for Other Services       Precautions / Restrictions Precautions Precautions: Fall Restrictions Weight Bearing Restrictions: Yes LLE Weight Bearing: Weight bearing as tolerated      Mobility  Bed Mobility Overal bed mobility: Needs Assistance Bed Mobility: Supine to Sit     Supine to sit: Mod assist     General bed mobility comments: assist needed with LLE, pt using rail and HOB elevated.  Transfers Overall transfer level: Needs assistance Equipment used: Rolling walker (2 wheeled) Transfers: Sit to/from Stand Sit to Stand: From elevated surface;Mod assist         General transfer comment: slow sit to stand transition, mild instability with initial standing.   Ambulation/Gait Ambulation/Gait assistance: Min guard Ambulation Distance (Feet): 12 Feet Assistive device: Rolling walker (2 wheeled) Gait Pattern/deviations: Step-to pattern;Decreased weight shift to left Gait velocity: slow pattern   General Gait Details: cues for gait sequence, encouraging weight through LLE as tolerated. Pt having difficulty with Lt LE swing phase.    Stairs            Wheelchair Mobility    Modified Rankin (Stroke Patients Only)       Balance Overall balance assessment: Needs assistance Sitting-balance support: No upper extremity supported Sitting balance-Leahy Scale: Good     Standing balance support: Bilateral upper extremity supported Standing balance-Leahy Scale: Poor Standing balance comment: using rw                             Pertinent Vitals/Pain Pain Assessment: 0-10 Pain Score: 2  Pain Location: Lt hip Pain Descriptors / Indicators: Aching;Sore Pain Intervention(s): Limited activity within patient's tolerance;Monitored during session    Home Living Family/patient expects to be discharged to:: Private residence Living Arrangements: Spouse/significant other;Children (kids are 14 and 10) Available Help at Discharge: Family;Available 24 hours/day Type of Home: House Home Access: Stairs to enter Entrance Stairs-Rails: None Entrance Stairs-Number of Steps: 5 Home Layout: Two level;Bed/bath upstairs, flight with landing Home Equipment: Bedside commode;Walker - 2 wheels;Crutches      Prior Function Level of Independence: Independent               Hand Dominance        Extremity/Trunk Assessment   Upper Extremity Assessment: Overall WFL for tasks assessed           Lower Extremity Assessment: RLE deficits/detail;LLE deficits/detail RLE Deficits / Details: Rt knee crepitis with mobility LLE Deficits / Details: assist needed with moving LE for bed mobility.      Communication   Communication: No  difficulties  Cognition Arousal/Alertness: Lethargic;Suspect due to medications Behavior During [facility]: [facility]FL for tasks assessed/performed Overall Cognitive Status: Within Functional Limits for tasks assessed                      General Comments      Exercises        Assessment/Plan    PT Assessment Patient needs continued PT services  PT Diagnosis Difficulty  walking   PT Problem List Decreased strength;Decreased range of motion;Decreased activity tolerance;Decreased balance;Decreased mobility  PT Treatment Interventions DME instruction;Gait training;Stair training;Functional mobility training;Therapeutic activities;Therapeutic exercise;Patient/family education   PT Goals (Current goals can be found in the Care Plan section) Acute Rehab PT Goals Patient Stated Goal: walk with less pain PT Goal Formulation: With patient Time For Goal Achievement: 09/14/16 Potential to Achieve Goals: Good    Frequency 7X/week   Barriers to discharge        Co-evaluation               End of Session Equipment Utilized During Treatment: Gait belt Activity Tolerance: Patient tolerated treatment well Patient left: in chair;with call bell/phone within reach Nurse Communication: Mobility status;Weight bearing status         Time: [id number] PT Time Calculation (min) (ACUTE ONLY): 37 min   Charges:   PT Evaluation $PT Eval Moderate Complexity: 1 Procedure PT Treatments $Gait Training: 8-22 mins   PT G Codes:        Christiane HaBenjamin J. Vickie Ponds, PT, CSCS Pager [phone] Office [phone]  08/31/2016, 4:23 PM

## 2016-08-31 NOTE — Op Note (Signed)
OPERATIVE REPORT  SURGEON: Samson FredericBrian Zaven Klemens, MD   ASSISTANT: April Green, RNFA.  PREOPERATIVE DIAGNOSIS: Left hip arthritis.   POSTOPERATIVE DIAGNOSIS: Left hip arthritis.   PROCEDURE: Left total hip arthroplasty, anterior approach.   IMPLANTS: DePuy Tri Lock stem, size 8, hi offset. DePuy Pinnacle Cup, size 60 mm. DePuy Altrx liner, size 36 by 60 mm, neutral. DePuy Biolox ceramic head ball, size 36 + 8.5 mm.  ANESTHESIA:  General  ESTIMATED BLOOD LOSS: 550 mL.  ANTIBIOTICS: 900 mg clindamycin.  DRAINS: None.  COMPLICATIONS: None.   CONDITION: PACU - hemodynamically stable.  BRIEF CLINICAL NOTE: Andre Hoffman is a 45 y.o. male with a long-standing history of Left hip arthritis. After failing conservative management, the patient was indicated for total hip arthroplasty. The risks, benefits, and alternatives to the procedure were explained, and the patient elected to proceed.  PROCEDURE IN DETAIL: Surgical site was marked by myself. Once inside the operative room, spinal anesthesia was attempted but unsuccessful. General anesthesia was induced. The patient was then positioned on the Hana table. All bony prominences were well padded. The hip was prepped and draped in the normal sterile surgical fashion. A time-out was called verifying side and site of surgery. The patient received IV antibiotics within 60 minutes of beginning the procedure.  The direct anterior approach to the hip was performed through the Hueter interval. Lateral femoral circumflex vessels were treated with the Auqumantys. The anterior capsule was exposed and an inverted T capsulotomy was made.The femoral neck cut was made to the level of the templated cut. A corkscrew was placed into the head and the head was removed. The femoral head was found to have eburnated bone. The head was passed to the back table and was measured.  Acetabular exposure was achieved, and the pulvinar and labrum were excised.  Sequental reaming of the acetabulum was then performed up to a size 59 mm reamer. A 60 mm cup was then opened and impacted into place at approximately 40 degrees of abduction and 20 degrees of anteversion. The final polyethylene liner was impacted into place and acetabular osteophytes were removed.   I then gained femoral exposure taking care to protect the abductors and greater trochanter. This was performed using standard external rotation, extension, and adduction. The capsule was peeled off the inner aspect of the greater trochanter, taking care to preserve the short external rotators. A cookie cutter was used to enter the femoral canal, and then the femoral canal finder was placed. Sequential broaching was performed up to a size 8. Calcar planer was used on the femoral neck remnant. I placed a hi offset neck and a trial head ball. The hip was reduced. Leg lengths and offset were checked fluoroscopically. The hip was dislocated and trial components were removed. The final implants were placed, and the hip was reduced.  Fluoroscopy was used to confirm component position and leg lengths. At 90 degrees of external rotation and full extension, the hip was stable to an anterior directed force.  The wound was copiously irrigated with a dilute betadine solution followed by normal saline. Marcaine solution was injected into the periarticular soft tissue. The wound was closed in layers using #1 Vicryl and V-Loc for the fascia, 2-0 Vicryl for the subcutaneous fat, 2-0 Monocryl for the deep dermal layer, 3-0 running Monocryl subcuticular stitch, and Dermabond for the skin. Once the glue was fully dried, an Aquacell Ag dressing was applied. The patient was transported to the recovery room in stable condition. Sponge,  needle, and instrument counts were correct at the end of the case x2. The patient tolerated the procedure well and there were no known complications.

## 2016-09-01 ENCOUNTER — Encounter (HOSPITAL_COMMUNITY): Payer: Self-pay | Admitting: Orthopedic Surgery

## 2016-09-01 LAB — BASIC METABOLIC PANEL
Anion gap: 10 (ref 5–15)
Anion gap: 8 (ref 5–15)
BUN: 21 mg/dL — AB (ref 6–20)
BUN: 23 mg/dL — AB (ref 6–20)
CHLORIDE: 104 mmol/L (ref 101–111)
CHLORIDE: 107 mmol/L (ref 101–111)
CO2: 23 mmol/L (ref 22–32)
CO2: 24 mmol/L (ref 22–32)
Calcium: 8.4 mg/dL — ABNORMAL LOW (ref 8.9–10.3)
Calcium: 8.5 mg/dL — ABNORMAL LOW (ref 8.9–10.3)
Creatinine, Ser: 1.31 mg/dL — ABNORMAL HIGH (ref 0.61–1.24)
Creatinine, Ser: 1.32 mg/dL — ABNORMAL HIGH (ref 0.61–1.24)
GFR calc Af Amer: >60 mL/min (ref >60)
GFR calc Af Amer: >60 mL/min (ref >60)
GFR calc non Af Amer: >60 mL/min (ref >60)
GFR calc non Af Amer: >60 mL/min (ref >60)
GLUCOSE: 166 mg/dL — AB (ref 65–99)
GLUCOSE: 218 mg/dL — AB (ref 65–99)
POTASSIUM: 4 mmol/L (ref 3.5–5.1)
POTASSIUM: 4 mmol/L (ref 3.5–5.1)
SODIUM: 139 mmol/L (ref 135–145)
Sodium: 137 mmol/L (ref 135–145)

## 2016-09-01 LAB — CBC
HEMATOCRIT: 30.1 % — AB (ref 39.0–52.0)
HEMOGLOBIN: 9.9 g/dL — AB (ref 13.0–17.0)
MCH: 32.7 pg (ref 26.0–34.0)
MCHC: 32.9 g/dL (ref 30.0–36.0)
MCV: 99.3 fL (ref 78.0–100.0)
Platelets: 257 10*3/uL (ref 150–400)
RBC: 3.03 MIL/uL — AB (ref 4.22–5.81)
RDW: 13.8 % (ref 11.5–15.5)
WBC: 12.5 10*3/uL — ABNORMAL HIGH (ref 4.0–10.5)

## 2016-09-01 MED ORDER — SENNA 8.6 MG PO TABS: 2.0000 | ORAL_TABLET | Freq: Every day | ORAL | 3 refills | Status: DC

## 2016-09-01 MED ORDER — ONDANSETRON HCL 4 MG PO TABS: 4.0000 mg | ORAL_TABLET | Freq: Four times a day (QID) | ORAL | 0 refills | Status: DC | PRN

## 2016-09-01 MED ORDER — ASPIRIN 81 MG PO CHEW: 81.0000 mg | CHEWABLE_TABLET | Freq: Two times a day (BID) | ORAL | 1 refills | Status: DC

## 2016-09-01 MED ORDER — DOCUSATE SODIUM 100 MG PO CAPS: 100.0000 mg | ORAL_CAPSULE | Freq: Two times a day (BID) | ORAL | 3 refills | Status: DC

## 2016-09-01 MED ORDER — HYDROCODONE-ACETAMINOPHEN 5-325 MG PO TABS: 1.0000 | ORAL_TABLET | ORAL | 0 refills | Status: DC | PRN

## 2016-09-01 NOTE — Discharge Summary (Signed)
Physician Discharge Summary  Patient ID: Andre Hoffman MRN: [id number] DOB/AGE: [birth year] 45 y.o.  Admit date: 08/31/2016 Discharge date: 09/02/2016  Admission Diagnoses:  Primary osteoarthritis of left hip  Discharge Diagnoses:  Principal Problem:   Primary osteoarthritis of left hip   Past Medical History:  Diagnosis Date  . Arthritis   . CHF (congestive heart failure) (HCC)    Echo- 2014- EF 40-45%; mild LVH.Takes Lasix daily  . History of bronchitis    3 yrs ago  . History of gout    only once   . Hypertension    takes Coreg and Losartan daily  . Joint pain   . White coat hypertension     Surgeries: Procedure(s): LEFT TOTAL HIP ARTHROPLASTY ANTERIOR APPROACH on 08/31/2016   Consultants (if any):   Discharged Condition: Improved  Hospital Course: Andre RyderJames II Finazzo is an 45 y.o. male who was admitted 08/31/2016 with a diagnosis of Primary osteoarthritis of left hip and went to the operating room on 08/31/2016 and underwent the above named procedures.    He was given perioperative antibiotics:  Anti-infectives    Start     Dose/Rate Route Frequency Ordered Stop   08/31/16 1430  clindamycin (CLEOCIN) IVPB 600 mg     600 mg 100 mL/hr over 30 Minutes Intravenous Every 6 hours 08/31/16 1355 08/31/16 2145   08/31/16 0700  clindamycin (CLEOCIN) IVPB 900 mg     900 mg 100 mL/hr over 30 Minutes Intravenous To ShortStay Surgical 08/28/16 0814 08/31/16 0813    .  He was given sequential compression devices, early ambulation, and ASA for DVT prophylaxis.  He benefited maximally from the hospital stay and there were no complications.    Recent vital signs:  Vitals:   09/01/16 2143 09/02/16 0403  BP: (!) 180/97 (!) 177/99  Pulse: 98 93  Resp: 16 16  Temp: 98.8 F (37.1 C) 98.6 F (37 C)    Recent laboratory studies:  Lab Results  Component Value Date   HGB 9.2 (L) 09/02/2016   HGB 9.9 (L) 09/01/2016   HGB 12.7 (L) 08/21/2016   Lab Results  Component Value Date    WBC 13.5 (H) 09/02/2016   PLT 298 09/02/2016   No results found for: INR Lab Results  Component Value Date   NA 138 09/02/2016   K 3.5 09/02/2016   CL 104 09/02/2016   CO2 26 09/02/2016   BUN 18 09/02/2016   CREATININE 1.10 09/02/2016   GLUCOSE 119 (H) 09/02/2016    Discharge Medications:     Medication List    STOP taking these medications   aspirin 81 MG tablet Replaced by:  aspirin 81 MG chewable tablet     TAKE these medications   aspirin 81 MG chewable tablet Chew 1 tablet (81 mg total) by mouth 2 (two) times daily. Replaces:  aspirin 81 MG tablet   carvedilol 12.5 MG tablet Commonly known as:  COREG Take 12.5 mg by mouth 2 (two) times daily.   docusate sodium 100 MG capsule Commonly known as:  COLACE Take 1 capsule (100 mg total) by mouth 2 (two) times daily.   furosemide 40 MG tablet Commonly known as:  LASIX Take 40 mg by mouth daily.   HYDROcodone-acetaminophen 5-325 MG tablet Commonly known as:  NORCO/VICODIN Take 1-2 tablets by mouth every 4 (four) hours as needed (breakthrough pain).   losartan 100 MG tablet Commonly known as:  COZAAR Take 100 mg by mouth daily.   ondansetron 4 MG  tablet Commonly known as:  ZOFRAN Take 1 tablet (4 mg total) by mouth every 6 (six) hours as needed for nausea.   potassium chloride 10 MEQ tablet Commonly known as:  K-DUR,KLOR-CON Take 10 mEq by mouth daily.   senna 8.6 MG Tabs tablet Commonly known as:  SENOKOT Take 2 tablets (17.2 mg total) by mouth at bedtime.       Diagnostic Studies: Dg Pelvis Portable  Result Date: 08/31/2016 CLINICAL DATA:  Post left hip replacement. EXAM: PORTABLE PELVIS 1-2 VIEWS COMPARISON:  None. FINDINGS: Changes of left hip replacement. No hardware for bony complicating feature. Normal AP alignment. Right hip and bilateral SI joints are unremarkable. IMPRESSION: Left hip replacement.  No complicating feature. Electronically Signed   By: Charlett Nose M.D.   On: 08/31/2016 12:03    Dg C-arm 61-120 Min  Result Date: 08/31/2016 CLINICAL DATA:  Fluoro spot images from anterior approach left total hip arthroplasty EXAM: OPERATIVE left HIP (WITH PELVIS IF PERFORMED) 2 VIEWS. 36 seconds fluoro time reported. TECHNIQUE: Fluoroscopic spot image(s) were submitted for interpretation post-operatively. COMPARISON:  None in PACs FINDINGS: Two fluoro spot images are reviewed. The positioning of the prosthetic left hip is good. The interface with the native bone appears normal. No acute abnormality of the native bone is demonstrated. IMPRESSION: No immediate complication following anterior approach left total hip joint replacement. Electronically Signed   By: David  [place] M.D.   On: 08/31/2016 11:11   Dg Hip Operative Unilat W Or W/o Pelvis Left  Result Date: 08/31/2016 CLINICAL DATA:  Fluoro spot images from anterior approach left total hip arthroplasty EXAM: OPERATIVE left HIP (WITH PELVIS IF PERFORMED) 2 VIEWS. 36 seconds fluoro time reported. TECHNIQUE: Fluoroscopic spot image(s) were submitted for interpretation post-operatively. COMPARISON:  None in PACs FINDINGS: Two fluoro spot images are reviewed. The positioning of the prosthetic left hip is good. The interface with the native bone appears normal. No acute abnormality of the native bone is demonstrated. IMPRESSION: No immediate complication following anterior approach left total hip joint replacement. Electronically Signed   By: David  [place] M.D.   On: 08/31/2016 11:11    Disposition: Final discharge disposition not confirmed  Discharge Instructions    Call MD / Call 911    Complete by:  As directed    If you experience chest pain or shortness of breath, CALL 911 and be transported to the hospital emergency room.  If you develope a fever above 101 F, pus (white drainage) or increased drainage or redness at the wound, or calf pain, call your surgeon's office.   Constipation Prevention    Complete by:  As directed    Drink  plenty of fluids.  Prune juice may be helpful.  You may use a stool softener, such as Colace (over the counter) 100 mg twice a day.  Use MiraLax (over the counter) for constipation as needed.   Diet - low sodium heart healthy    Complete by:  As directed    Driving restrictions    Complete by:  As directed    No driving for 6 weeks   Increase activity slowly as tolerated    Complete by:  As directed    Lifting restrictions    Complete by:  As directed    No lifting for 6 weeks   TED hose    Complete by:  As directed    Use stockings (TED hose) for 2 weeks on both leg(s).  You may remove them at  night for sleeping.      Follow-up Information    Uzma Hellmer, Cloyde Reams, MD. Schedule an appointment as soon as possible for a visit in 2 week(s).   Specialty:  Orthopedic Surgery Why:  For wound re-check Contact information: [address] [place] [address] [phone]        [facility] .   Why:  Home Health Physical Therapy Contact information: [address] [place] [address] [phone]            Signed: Teague, Goynes 09/02/2016, 2:56 PM

## 2016-09-01 NOTE — Evaluation (Signed)
Occupational Therapy Evaluation Patient Details Name: Andre Hoffman MRN: [id number] DOB: 1971-06-15 Today's Date: 09/01/2016    History of Present Illness 45 y.o. male now s/p Lt direct anterior THA. PMH: HTN, CHF.    Clinical Impression   Pt is s/p THA resulting in the deficits listed below (see OT Problem List). Pt will benefit from skilled OT to increase their safety and independence with ADL and functional mobility for ADL to facilitate discharge to venue listed below.       Follow Up Recommendations  No OT follow up    Equipment Recommendations  None recommended by OT    Recommendations for Other Services       Precautions / Restrictions Precautions Precautions: Fall Restrictions Weight Bearing Restrictions: Yes LLE Weight Bearing: Weight bearing as tolerated      Mobility Bed Mobility               General bed mobility comments: Pt received in recliner on arrival.    Transfers Overall transfer level: Needs assistance Equipment used: Rolling walker (2 wheeled) Transfers: Sit to/from Stand Sit to Stand: From elevated surface;Min assist         General transfer comment: slow sit to stand transition with audible crepitus in R hip and Knee.  Pt required cues to scoot to edge of recliner before attempting to stand, cues for forward weight shifting and cues for walking feet toward chair to direct his feet under BOS.      Balance     Sitting balance-Leahy Scale: Good       Standing balance-Leahy Scale: Poor                              ADL Overall ADL's : Needs assistance/impaired Eating/Feeding: Set up;Sitting   Grooming: Min guard;Standing               Lower Body Dressing: Moderate assistance;Sit to/from stand;Cueing for sequencing;Cueing for compensatory techniques   Toilet Transfer: Minimal assistance;Comfort height toilet;RW;Ambulation;Cueing for sequencing;Cueing for safety   Toileting- Clothing Manipulation and  Hygiene: Minimal assistance;Sit to/from stand;Cueing for sequencing;Cueing for safety               Vision     Perception     Praxis      Pertinent Vitals/Pain Pain Assessment: 0-10 Pain Score: 2  Pain Location:   L hip Pain Descriptors / Indicators: Sore Pain Intervention(s): Limited activity within patient's tolerance     Hand Dominance     Extremity/Trunk Assessment Upper Extremity Assessment Upper Extremity Assessment: Overall WFL for tasks assessed           Communication Communication Communication: No difficulties   Cognition Arousal/Alertness: Awake/alert Behavior During Therapy: WFL for tasks assessed/performed Overall Cognitive Status: Within Functional Limits for tasks assessed                                Home Living Family/patient expects to be discharged to:: Private residence Living Arrangements: Spouse/significant other;Children (kids are 14 and 10) Available Help at Discharge: Family;Available 24 hours/day Type of Home: House Home Access: Stairs to enter [employer] of Steps: 5 Entrance Stairs-Rails: None Home Layout: Two level;Bed/bath upstairs Alternate Level Stairs-Number of Steps: flight (with landing) Alternate Level Stairs-Rails:  (1st flight lt, 2nd flight rt) Bathroom Shower/Tub: Tub/shower unit Shower/tub characteristics: Engineer, building services: Standard     Home  Equipment: Bedside commode;Walker - 2 wheels;Crutches          Prior Functioning/Environment Level of Independence: Independent             OT Diagnosis: Generalized weakness   OT Problem List: Decreased strength   OT Treatment/Interventions: Self-care/ADL training;DME and/or AE instruction;Patient/family education    OT Goals(Current goals can be found in the care plan section) Acute Rehab OT Goals Patient Stated Goal: walk with less pain OT Goal Formulation: With patient Time For Goal Achievement: 09/15/16 Potential to  Achieve Goals: Good ADL Goals Pt Will Perform Lower Body Dressing: with supervision;sit to/from stand;with adaptive equipment Pt Will Transfer to Toilet: with supervision;ambulating Pt Will Perform Toileting - Clothing Manipulation and hygiene: with supervision;sit to/from stand Pt Will Perform Tub/Shower Transfer: with min assist;Tub transfer  OT Frequency: Min 2X/week   Barriers to D/C:            Co-evaluation              End of Session Nurse Communication: Mobility status  Activity Tolerance: Patient tolerated treatment well Patient left: in chair;with call bell/phone within reach   Time: 1220-1246 OT Time Calculation (min): 26 min Charges:  OT General Charges $OT Visit: 1 Procedure OT Evaluation $OT Eval Low Complexity: 1 Procedure OT Treatments $Self Care/Home Management : 8-22 mins G-Codes:    Einar CrowEDDING, Vylette Strubel D 09/01/2016, 1:03 PM

## 2016-09-01 NOTE — Progress Notes (Signed)
Physical Therapy Treatment Patient Details Name: Andre Hoffman MRN: [id number] DOB: 04-08-1971 Today's Date: 09/01/2016    History of Present Illness 45 y.o. male now s/p Lt direct anterior THA. PMH: HTN, CHF.     PT Comments    Will continue stair training next session.  Pt to d/c home in am.    Follow Up Recommendations  Home health PT;Supervision for mobility/OOB     Equipment Recommendations  None recommended by PT    Recommendations for Other Services       Precautions / Restrictions Precautions Precautions: Fall Restrictions Weight Bearing Restrictions: Yes LLE Weight Bearing: Weight bearing as tolerated    Mobility  Bed Mobility               General bed mobility comments: Pt received in recliner on arrival.    Transfers Overall transfer level: Needs assistance Equipment used: Rolling walker (2 wheeled) Transfers: Sit to/from Stand Sit to Stand: Supervision         General transfer comment: Better carryover with technique.  Improved ability to transition and shift weight forward.    Ambulation/Gait Ambulation/Gait assistance: Supervision Ambulation Distance (Feet): 70 Feet Assistive device: Rolling walker (2 wheeled) Gait Pattern/deviations: Step-through pattern;Trunk flexed;Shuffle;Antalgic;Decreased stride length;Wide base of support Gait velocity: slow pattern Gait velocity interpretation: Below normal speed for age/gender General Gait Details: Cues for sequencing, upper trunk control, and pacing.  pt required 1 rest break in standing.  Improved symmetry with step through pattern remains flexed during gait training.       Stairs Stairs: Yes Stairs assistance: Min assist Stair Management: No rails Number of Stairs: 3 General stair comments: Cues for sequencing and RW placement.  Pt apprehensive during trial.  Will attempt again this pm to instill confidence.  Pt performed backwards to ascend and forwards to descend.  Pt has difficulty  placing entire foot on stair due to decreased R knee ROM.  Pt's spouse present to observe and participate in stair training.    Wheelchair Mobility    Modified Rankin (Stroke Patients Only)       Balance Overall balance assessment: Needs assistance   Sitting balance-Leahy Scale: Good       Standing balance-Leahy Scale: Poor                      Cognition Arousal/Alertness: Awake/alert Behavior During Therapy: WFL for tasks assessed/performed Overall Cognitive Status: Within Functional Limits for tasks assessed                      Exercises Total Joint Exercises Ankle Circles/Pumps: AROM;Both;Supine;10 reps Quad Sets: AROM;10 reps;Supine;Right Short Arc Quad: AROM;Right;10 reps;Supine Heel Slides: AROM;Right;10 reps;Supine Hip ABduction/ADduction: AROM;Right;10 reps;Supine Long Arc Quad: AROM;Right;10 reps;Supine    General Comments        Pertinent Vitals/Pain Pain Assessment: 0-10 Pain Score: 3  Pain Location: L hip Pain Descriptors / Indicators: Sore;Grimacing;Guarding Pain Intervention(s): Monitored during session;Repositioned    Home Living Family/patient expects to be discharged to:: Private residence Living Arrangements: Spouse/significant other;Children (kids are 14 and 10) Available Help at Discharge: Family;Available 24 hours/day Type of Home: House Home Access: Stairs to enter Entrance Stairs-Rails: None Home Layout: Two level;Bed/bath upstairs Home Equipment: Bedside commode;Walker - 2 wheels;Crutches      Prior Function Level of Independence: Independent          PT Goals (current goals can now be found in the care plan section) Acute Rehab PT Goals Patient Stated  Goal: walk with less pain Potential to Achieve Goals: Good Progress towards PT goals: Progressing toward goals    Frequency  7X/week    PT Plan Current plan remains appropriate    Co-evaluation             End of Session Equipment Utilized During  Treatment: Gait belt Activity Tolerance: Patient tolerated treatment well Patient left: in chair;with call bell/phone within reach     Time: 1300-1330 PT Time Calculation (min) (ACUTE ONLY): 30 min  Charges:  $Gait Training: 8-22 mins $Therapeutic Exercise: 8-22 mins $Therapeutic Activity: 8-22 mins                    G Codes:      Florestine Aversimee J Deepa Barthel 09/01/2016, 1:46 PM

## 2016-09-01 NOTE — Care Management Note (Signed)
Case Management Note  Patient Details  Name: Andre Hoffman MRN: [id number] Date of Birth: 01/06/1971  Subjective/Objective: s/p left direct anterior THA                   Action/Plan: Discharge Planning: AVS reviewed:  NCM spoke to pt and wife at bedside. [facility] States he has RW and 3n1 at home. Contacted [facility] to make aware of dc home today with HH.     Expected Discharge Date:  09/01/2016              Expected Discharge Plan:  Home w Home Health Services  In-House Referral:  NA  Discharge planning Services  CM Consult  Post Acute Care Choice:  Home Health Choice offered to:  Patient  DME Arranged:  N/A DME Agency:  NA  HH Arranged:  PT [facility]  Status of Service:  Completed, signed off  If discussed at [employer] Length of Stay Meetings, dates discussed:    Additional Comments:  Elliot CousinShavis, Josel Keo Ellen, RN 09/01/2016, 1:03 PM

## 2016-09-01 NOTE — Progress Notes (Signed)
Physical Therapy Treatment Patient Details Name: Andre Hoffman MRN: [id number] DOB: 04/27/1971 Today's Date: 09/01/2016    History of Present Illness 45 y.o. male now s/p Lt direct anterior THA. PMH: HTN, CHF.     PT Comments    Pt required increased time to ambulate 50 ft and negotiate x2 stairs.  Pt is not ready for d/c today and would benefit from 1 more day in the hospital to improve mobility before returning home.  Pt has 5 stairs to enter home followed by a flight to enter bed room.  Informed nurse of need for overnight stay and repeated session this pm.  Unable to educated and perform HEP due to slow pace and deconditioned presentation after gait activities.     Follow Up Recommendations  Home health PT;Supervision for mobility/OOB     Equipment Recommendations  None recommended by PT (Pt reports having all equipment at home.  )    Recommendations for Other Services       Precautions / Restrictions Precautions Precautions: Fall Restrictions Weight Bearing Restrictions: Yes LLE Weight Bearing: Weight bearing as tolerated    Mobility  Bed Mobility               General bed mobility comments: Pt received in recliner on arrival.    Transfers Overall transfer level: Needs assistance Equipment used: Rolling walker (2 wheeled) Transfers: Sit to/from Stand Sit to Stand: From elevated surface;Min guard         General transfer comment: slow sit to stand transition with audible crepitus in R hip and Knee.  Pt required cues to scoot to edge of recliner before attempting to stand, cues for forward weight shifting and cues for walking feet toward chair to direct his feet under BOS.    Ambulation/Gait Ambulation/Gait assistance: Min guard Ambulation Distance (Feet): 50 Feet Assistive device: Rolling walker (2 wheeled) Gait Pattern/deviations: Step-to pattern;Step-through pattern;Shuffle;Trunk flexed;Wide base of support;Antalgic Gait velocity: slow pattern Gait  velocity interpretation: Below normal speed for age/gender General Gait Details: Cues for sequencing, upper trunk control, and pacing.  pt required 2 rest breaks in standing and required recliner to be brought to patient for seated rest break before stair training.     Stairs Stairs: Yes Stairs assistance: Min assist Stair Management: No rails;Step to pattern;Backwards;Forwards;With walker Number of Stairs: 2 General stair comments: Cues for sequencing and RW placement.  Pt apprehensive during trial.  Will attempt again this pm to instill confidence.  Pt performed backwards to ascend and forwards to descend.  Pt has difficulty placing entire foot on stair due to decreased R knee ROM.    Wheelchair Mobility    Modified Rankin (Stroke Patients Only)       Balance     Sitting balance-Leahy Scale: Good       Standing balance-Leahy Scale: Poor                      Cognition Arousal/Alertness: Awake/alert Behavior During Therapy: WFL for tasks assessed/performed Overall Cognitive Status: Within Functional Limits for tasks assessed                      Exercises      General Comments        Pertinent Vitals/Pain Pain Assessment: 0-10 Pain Score: 2  Pain Descriptors / Indicators: Aching;Sore Pain Intervention(s): Monitored during session    Home Living  Prior Function            PT Goals (current goals can now be found in the care plan section) Acute Rehab PT Goals Patient Stated Goal: walk with less pain Potential to Achieve Goals: Good Progress towards PT goals: Progressing toward goals    Frequency  7X/week    PT Plan Current plan remains appropriate    Co-evaluation             End of Session Equipment Utilized During Treatment: Gait belt Activity Tolerance: Patient tolerated treatment well Patient left: in chair;with call bell/phone within reach     Time: [id number] PT Time Calculation (min)  (ACUTE ONLY): 38 min  Charges:  $Gait Training: 23-37 mins $Therapeutic Activity: 8-22 mins                    G Codes:      Florestine Aversimee J Matix Henshaw 09/01/2016, 10:28 AM  Joycelyn RuaAimee Senai Ramnath, PTA pager [phone]

## 2016-09-01 NOTE — Progress Notes (Signed)
Paged Dr. Linna CapriceSwinteck team, to alert him of patient wanting to take a shower and physical therapy would like for patient to stay another day. Please see PT Note.  Cory Roughen. Caison Hearn, RN

## 2016-09-01 NOTE — Progress Notes (Signed)
   Subjective:  Patient reports pain as mild to moderate.  No c/o.  Objective:   VITALS:   Vitals:   08/31/16 2142 08/31/16 2144 09/01/16 0030 09/01/16 0650  BP: (!) 178/111 (!) 168/92 (!) 173/101 (!) 145/71  Pulse: 75  81 77  Resp: 17  17 17   Temp: 98.6 F (37 C)  99.7 F (37.6 C) 99.4 F (37.4 C)  TempSrc: Oral  Oral Oral  SpO2: 100%  100% 100%  Weight:      Height:        ABD soft Sensation intact distally Intact pulses distally Dorsiflexion/Plantar flexion intact Incision: dressing C/D/I Compartment soft   Lab Results  Component Value Date   WBC 12.5 (H) 09/01/2016   HGB 9.9 (L) 09/01/2016   HCT 30.1 (L) 09/01/2016   MCV 99.3 09/01/2016   PLT 257 09/01/2016   BMET    Component Value Date/Time   NA 137 09/01/2016 0437   K 4.0 09/01/2016 0437   CL 104 09/01/2016 0437   CO2 23 09/01/2016 0437   GLUCOSE 166 (H) 09/01/2016 0437   BUN 21 (H) 09/01/2016 0437   CREATININE 1.31 (H) 09/01/2016 0437   CALCIUM 8.4 (L) 09/01/2016 0437   GFRNONAA >60 09/01/2016 0437   GFRAA >60 09/01/2016 0437     Assessment/Plan: 1 Day Post-Op   Principal Problem:   Primary osteoarthritis of left hip  WBAT with walker DVT ppx: ASA, SCDs, TEDs PO pain control PT/OT Mild Cr elevation: will repeat BMP at 1300 Dispo: recheck BMP today, d/c home with HHPT after clears therapy    Andre Hoffman, Andre Hoffman 09/01/2016, 7:38 AM   Andre FredericBrian Josephus Harriger, MD Cell [phone]) [phone]

## 2016-09-02 LAB — CBC
HEMATOCRIT: 28.1 % — AB (ref 39.0–52.0)
HEMOGLOBIN: 9.2 g/dL — AB (ref 13.0–17.0)
MCH: 32.3 pg (ref 26.0–34.0)
MCHC: 32.7 g/dL (ref 30.0–36.0)
MCV: 98.6 fL (ref 78.0–100.0)
Platelets: 298 10*3/uL (ref 150–400)
RBC: 2.85 MIL/uL — ABNORMAL LOW (ref 4.22–5.81)
RDW: 13.8 % (ref 11.5–15.5)
WBC: 13.5 10*3/uL — ABNORMAL HIGH (ref 4.0–10.5)

## 2016-09-02 LAB — BASIC METABOLIC PANEL
ANION GAP: 8 (ref 5–15)
BUN: 18 mg/dL (ref 6–20)
CHLORIDE: 104 mmol/L (ref 101–111)
CO2: 26 mmol/L (ref 22–32)
Calcium: 8.7 mg/dL — ABNORMAL LOW (ref 8.9–10.3)
Creatinine, Ser: 1.1 mg/dL (ref 0.61–1.24)
GFR calc non Af Amer: >60 mL/min (ref >60)
Glucose, Bld: 119 mg/dL — ABNORMAL HIGH (ref 65–99)
Potassium: 3.5 mmol/L (ref 3.5–5.1)
Sodium: 138 mmol/L (ref 135–145)

## 2016-09-02 MED ORDER — PNEUMOCOCCAL VAC POLYVALENT 25 MCG/0.5ML IJ INJ: 0.5000 mL | INJECTION | INTRAMUSCULAR | Status: DC | PRN

## 2016-09-02 NOTE — Progress Notes (Signed)
Occupational Therapy Treatment Patient Details Name: Andre Hoffman MRN: [id number] DOB: 1971/09/10 Today's Date: 09/02/2016    History of present illness 45 y.o. male now s/p Lt direct anterior THA. PMH: HTN, CHF.    OT comments  Pt much improved!           Precautions / Restrictions Precautions Precautions: Fall Restrictions Weight Bearing Restrictions: Yes LLE Weight Bearing: Weight bearing as tolerated       Mobility Bed Mobility               General bed mobility comments: Pt  in recliner on arrival.    Transfers Overall transfer level: Needs assistance Equipment used: Rolling walker (2 wheeled) Transfers: Sit to/from Stand Sit to Stand: Supervision                  ADL Overall ADL's : Needs assistance/impaired                     Lower Body Dressing: Minimal assistance;Sit to/from stand;Cueing for compensatory techniques;Cueing for sequencing   Toilet Transfer: Min guard;Comfort height toilet;RW;Ambulation;Cueing for sequencing;Cueing for safety   Toileting- Clothing Manipulation and Hygiene: Supervision/safety;Cueing for sequencing;Cueing for safety     Tub/Shower Transfer Details (indicate cue type and reason): verbalized safety - will practice with HH therapist                    Cognition   Behavior During Therapy: WFL for tasks assessed/performed Overall Cognitive Status: Within Functional Limits for tasks assessed                                    Pertinent Vitals/ Pain       Pain Score: 3  Pain Location: L hip Pain Descriptors / Indicators: Sore Pain Intervention(s): Monitored during session      Progress Toward Goals  OT Goals(current goals can now be found in the care plan section)  Progress towards OT goals: Progressing toward goals         Activity Tolerance  good   Patient Left  in chair with call bell within reach           Time: 1010-1024 OT Time Calculation (min): 14  min  Charges: OT General Charges $OT Visit: 1 Procedure OT Treatments $Self Care/Home Management : 8-22 mins  Benjy Kana D 09/02/2016, 10:57 AM

## 2016-09-02 NOTE — Anesthesia Postprocedure Evaluation (Signed)
Anesthesia Post Note  Patient: Andre Hoffman  Procedure(s) Performed: Procedure(s) (LRB): LEFT TOTAL HIP ARTHROPLASTY ANTERIOR APPROACH (Left)  Patient location during evaluation: PACU Anesthesia Type: General Level of consciousness: awake Pain management: pain level controlled Vital Signs Assessment: post-procedure vital signs reviewed and stable Respiratory status: spontaneous breathing Cardiovascular status: stable Postop Assessment: no signs of nausea or vomiting Anesthetic complications: no    Last Vitals:  Vitals:   09/01/16 2143 09/02/16 0403  BP: (!) 180/97 (!) 177/99  Pulse: 98 93  Resp: 16 16  Temp: 37.1 C 37 C    Last Pain:  Vitals:   09/02/16 0824  TempSrc:   PainSc: 6                  Fredrick Geoghegan

## 2016-09-02 NOTE — Progress Notes (Addendum)
Physical Therapy Treatment Patient Details Name: Andre Hoffman MRN: [id number] DOB: Mar 15, 1971 Today's Date: 09/02/2016    History of Present Illness 45 y.o. male now s/p Lt direct anterior THA. PMH: HTN, CHF.     PT Comments    Pt performed stair training to simulate route to bed room and shower.  Pt performed with increased time.    Follow Up Recommendations  Home health PT;Supervision for mobility/OOB     Equipment Recommendations  None recommended by PT    Recommendations for Other Services       Precautions / Restrictions Precautions Precautions: Fall Restrictions Weight Bearing Restrictions: Yes LLE Weight Bearing: Weight bearing as tolerated    Mobility  Bed Mobility               General bed mobility comments: Pt  in recliner on arrival.    Transfers Overall transfer level: Needs assistance Equipment used: Rolling walker (2 wheeled) Transfers: Sit to/from Stand Sit to Stand: Modified independent (Device/Increase time)         General transfer comment: No cues and demonstrated good technique.  Pt performed from recliner x 1 and BSC x1.  Pt required rest break at top of stair well [place] which [name]  Ambulation/Gait Ambulation/Gait assistance: Supervision Ambulation Distance (Feet):  (steps to and from stair well.  Deferred gait to focus on stair training.  ) Assistive device: Rolling walker (2 wheeled) Gait Pattern/deviations: Step-through pattern;Trunk flexed;Wide base of support Gait velocity: slow pattern Gait velocity interpretation: Below normal speed for age/gender      Stairs Stairs: Yes Stairs assistance: Min assist Stair Management: One rail Left;One rail Right;With cane;With walker Number of Stairs: 12 General stair comments: Pt performed x6 with L rail and cane in R hand to ascend.  Pt switched rails for last six stairs with R rail and cane in L hand.  Pt performed in this method because he has a landing mid way and the rail is  on the opposite side after the landing.  Pt required cues for sequencing and hand placement.  Pt attempted cane with descent but fearful and required RW to descend 12 stairs.  Wife present and assisted patient.  Pt fatigued post trial.  Educated wife on using BSC as chair at top of stairs.    Wheelchair Mobility    Modified Rankin (Stroke Patients Only)       Balance Overall balance assessment: Needs assistance   Sitting balance-Leahy Scale: Good       Standing balance-Leahy Scale: Fair                      Cognition Arousal/Alertness: Awake/alert Behavior During Therapy: WFL for tasks assessed/performed Overall Cognitive Status: Within Functional Limits for tasks assessed                      Exercises     General Comments        Pertinent Vitals/Pain Pain Assessment: 0-10 Pain Score: 7  Pain Location: L hip Pain Descriptors / Indicators: Grimacing;Guarding;Sore Pain Intervention(s): Monitored during session;Repositioned;Ice applied    Home Living                      Prior Function            PT Goals (current goals can now be found in the care plan section) Acute Rehab PT Goals Patient Stated Goal: walk with less pain Potential  to Achieve Goals: Good Progress towards PT goals: Progressing toward goals    Frequency  7X/week    PT Plan Current plan remains appropriate    Co-evaluation             End of Session Equipment Utilized During Treatment: Gait belt Activity Tolerance: Patient tolerated treatment well Patient left: in chair;with call bell/phone within reach     Time: 1232-1312 PT Time Calculation (min) (ACUTE ONLY): 40 min  Charges:  $Gait Training: 23-37 mins $Therapeutic Activity: 8-22 mins                   G Codes:      Florestine Avers 2016/09/09, 2:32 PM  Joycelyn Rua, PTA pager [phone]

## 2016-09-02 NOTE — Progress Notes (Signed)
   Subjective:  Patient reports pain as mild to moderate.  No c/o.  Objective:   VITALS:   Vitals:   09/01/16 0650 09/01/16 1443 09/01/16 2143 09/02/16 0403  BP: (!) 145/71 (!) 163/84 (!) 180/97 (!) 177/99  Pulse: 77 90 98 93  Resp: 17 18 16 16   Temp: 99.4 F (37.4 C) 99.2 F (37.3 C) 98.8 F (37.1 C) 98.6 F (37 C)  TempSrc: Oral  Oral Oral  SpO2: 100% 100% 100% 100%  Weight:      Height:        ABD soft Sensation intact distally Intact pulses distally Dorsiflexion/Plantar flexion intact Incision: dressing C/D/I Compartment soft   Lab Results  Component Value Date   WBC 13.5 (H) 09/02/2016   HGB 9.2 (L) 09/02/2016   HCT 28.1 (L) 09/02/2016   MCV 98.6 09/02/2016   PLT 298 09/02/2016   BMET    Component Value Date/Time   NA 139 09/01/2016 1300   K 4.0 09/01/2016 1300   CL 107 09/01/2016 1300   CO2 24 09/01/2016 1300   GLUCOSE 218 (H) 09/01/2016 1300   BUN 23 (H) 09/01/2016 1300   CREATININE 1.32 (H) 09/01/2016 1300   CALCIUM 8.5 (L) 09/01/2016 1300   GFRNONAA >60 09/01/2016 1300   GFRAA >60 09/01/2016 1300     Assessment/Plan: 2 Days Post-Op   Principal Problem:   Primary osteoarthritis of left hip  WBAT with walker DVT ppx: ASA, SCDs, TEDs PO pain control PT/OT Mild Cr elevation: stable, recheck Dispo: recheck BMP today, d/c home with HHPT after clears therapy    Andre Hoffman, Andre Hoffman 09/02/2016, 7:40 AM   Andre FredericBrian Kohan Azizi, MD Cell [phone]

## 2016-09-02 NOTE — Progress Notes (Signed)
Physical Therapy Treatment Patient Details Name: Andre Hoffman [place] MRN: [id number] DOB: 06/12/1971 Today's Date: 09/02/2016    History of Present Illness 45 y.o. male now s/p Lt direct anterior THA. PMH: HTN, CHF.     PT Comments    Pt performed increased mobility and negotiated x5 stairs to simulate entry into home.  Pt reviewed completed HEP and PTA will return this pm to perform stair training to simulate path to bed room.    Follow Up Recommendations  Home health PT;Supervision for mobility/OOB     Equipment Recommendations  None recommended by PT    Recommendations for Other Services       Precautions / Restrictions Precautions Precautions: Fall Restrictions Weight Bearing Restrictions: Yes LLE Weight Bearing: Weight bearing as tolerated    Mobility  Bed Mobility               General bed mobility comments: Pt  in recliner on arrival.    Transfers Overall transfer level: Needs assistance Equipment used: Rolling walker (2 wheeled) Transfers: Sit to/from Stand Sit to Stand: Supervision         General transfer comment: Pt performed increased mobility and demonstrated better transition to standing.    Ambulation/Gait Ambulation/Gait assistance: Supervision Ambulation Distance (Feet): 90 Feet Assistive device: Rolling walker (2 wheeled) Gait Pattern/deviations: Step-through pattern;Trunk flexed;Wide base of support Gait velocity: slow pattern Gait velocity interpretation: Below normal speed for age/gender General Gait Details: Pt performed increased mobility with progression to step through pattern.  Pt required cues for upper trunk control, L knee extension and L heel strike.  Pt demonstrates improper sequencing x1 and buckled on L side.  Cues provided for sequencing and no more incident of buckling remain.     Stairs Stairs: Yes Stairs assistance: Min assist Stair Management: No rails;Step to pattern;Backwards;Forwards;With walker Number of Stairs:  5 General stair comments: Cues for sequencing and RW placement.  pt completed 5 stairs during session and improved technique.  Endurance improved and will attempt flight of stairs this pm.    Wheelchair Mobility    Modified Rankin (Stroke Patients Only)       Balance Overall balance assessment: Needs assistance   Sitting balance-Leahy Scale: Good       Standing balance-Leahy Scale: Fair                      Cognition Arousal/Alertness: Awake/alert Behavior During Therapy: WFL for tasks assessed/performed Overall Cognitive Status: Within Functional Limits for tasks assessed                      Exercises Total Joint Exercises Ankle Circles/Pumps: AROM;Both;Supine;10 reps Quad Sets: AROM;10 reps;Supine;Left Short Arc Quad: AROM;10 reps;Supine;Left Heel Slides: AROM;10 reps;Supine;Left Hip ABduction/ADduction: AROM;Supine;Left;20 reps;Standing (x10 supine and x10 standing.  ) Long Arc Quad: AROM;10 reps;Supine;Left Knee Flexion: AROM;Left;10 reps;Standing Marching in Standing: AROM;10 reps;Standing;Left Standing Hip Extension: AROM;Left;10 reps;Standing    General Comments        Pertinent Vitals/Pain Pain Assessment: 0-10 Pain Score: 7  Pain Location: L hip  Pain Descriptors / Indicators: Grimacing;Guarding;Sore Pain Intervention(s): Monitored during session;Repositioned;Ice applied    Home Living                      Prior Function            PT Goals (current goals can now be found in the care plan section) Acute Rehab PT Goals Patient Stated Goal: walk with  less pain Potential to Achieve Goals: Good Progress towards PT goals: Progressing toward goals    Frequency  7X/week    PT Plan Current plan remains appropriate    Co-evaluation             End of Session Equipment Utilized During Treatment: Gait belt Activity Tolerance: Patient tolerated treatment well Patient left: in chair;with call bell/phone within reach      Time: 0939-1008 PT Time Calculation (min) (ACUTE ONLY): 29 min  Charges:  $Gait Training: 8-22 mins $Therapeutic Exercise: 8-22 mins                    G Codes:      Florestine Avers 09-24-2016, 12:27 PM  Joycelyn Rua, PTA pager [phone]

## 2016-12-22 DIAGNOSIS — M1711 Unilateral primary osteoarthritis, right knee: Secondary | ICD-10-CM | POA: Diagnosis not present

## 2016-12-29 ENCOUNTER — Ambulatory Visit: Payer: Self-pay | Admitting: Orthopedic Surgery

## 2017-01-11 ENCOUNTER — Encounter (HOSPITAL_COMMUNITY)
Admission: RE | Admit: 2017-01-11 | Discharge: 2017-01-11 | Disposition: A | Discharge status: Home / Self Care | Payer: 59 | Source: Ambulatory Visit | Attending: Orthopedic Surgery | Admitting: Orthopedic Surgery

## 2017-01-11 ENCOUNTER — Encounter (HOSPITAL_COMMUNITY): Payer: Self-pay

## 2017-01-11 DIAGNOSIS — M1711 Unilateral primary osteoarthritis, right knee: Secondary | ICD-10-CM | POA: Diagnosis not present

## 2017-01-11 DIAGNOSIS — Z96642 Presence of left artificial hip joint: Secondary | ICD-10-CM | POA: Insufficient documentation

## 2017-01-11 DIAGNOSIS — I509 Heart failure, unspecified: Secondary | ICD-10-CM | POA: Insufficient documentation

## 2017-01-11 DIAGNOSIS — Z0183 Encounter for blood typing: Secondary | ICD-10-CM | POA: Diagnosis not present

## 2017-01-11 DIAGNOSIS — I110 Hypertensive heart disease with heart failure: Secondary | ICD-10-CM | POA: Diagnosis not present

## 2017-01-11 DIAGNOSIS — Z01812 Encounter for preprocedural laboratory examination: Secondary | ICD-10-CM | POA: Diagnosis not present

## 2017-01-11 DIAGNOSIS — M109 Gout, unspecified: Secondary | ICD-10-CM | POA: Insufficient documentation

## 2017-01-11 DIAGNOSIS — Z01818 Encounter for other preprocedural examination: Secondary | ICD-10-CM | POA: Diagnosis not present

## 2017-01-11 LAB — CBC
HCT: 37.6 % — ABNORMAL LOW (ref 39.0–52.0)
Hemoglobin: 12.2 g/dL — ABNORMAL LOW (ref 13.0–17.0)
MCH: 27.7 pg (ref 26.0–34.0)
MCHC: 32.4 g/dL (ref 30.0–36.0)
MCV: 85.3 fL (ref 78.0–100.0)
PLATELETS: 343 10*3/uL (ref 150–400)
RBC: 4.41 MIL/uL (ref 4.22–5.81)
RDW: 16.7 % — AB (ref 11.5–15.5)
WBC: 7 10*3/uL (ref 4.0–10.5)

## 2017-01-11 LAB — SURGICAL PCR SCREEN
MRSA, PCR: NEGATIVE
STAPHYLOCOCCUS AUREUS: NEGATIVE

## 2017-01-11 LAB — BASIC METABOLIC PANEL
Anion gap: 9 (ref 5–15)
BUN: 21 mg/dL — AB (ref 6–20)
CHLORIDE: 101 mmol/L (ref 101–111)
CO2: 24 mmol/L (ref 22–32)
Calcium: 9.6 mg/dL (ref 8.9–10.3)
Creatinine, Ser: 1.31 mg/dL — ABNORMAL HIGH (ref 0.61–1.24)
Glucose, Bld: 129 mg/dL — ABNORMAL HIGH (ref 65–99)
Potassium: 4.3 mmol/L (ref 3.5–5.1)
SODIUM: 134 mmol/L — AB (ref 135–145)

## 2017-01-11 NOTE — Progress Notes (Signed)
   01/11/17 1141  OBSTRUCTIVE SLEEP APNEA  Have you ever been diagnosed with sleep apnea through a sleep study? No  Do you snore loudly (loud enough to be heard through closed doors)?  1  Do you often feel tired, fatigued, or sleepy during the daytime (such as falling asleep during driving or talking to someone)? 0  Has anyone observed you stop breathing during your sleep? 0  Do you have, or are you being treated for high blood pressure? 1  BMI more than 35 kg/m2? 1  Age > 50 (1-yes) 0  Neck circumference greater than:Male 16 inches or larger, Male 17inches or larger? 1  Male Gender (Yes=1) 1  Obstructive Sleep Apnea Score 5  Score 5 or greater  Results sent to PCP

## 2017-01-11 NOTE — Pre-Procedure Instructions (Signed)
Andre Hoffman  01/11/2017      [id number] [place] - [address] AT [id number] [place] [id number] Phone: [phone] Fax: [phone]    Your procedure is scheduled on Jan 18, 2017.  [facility] Admitting at 845 A.M.  Call this number if you have problems the morning of surgery:  [phone]   Remember:  Do not eat food or drink liquids after midnight.  Take these medicines the morning of surgery with A SIP OF WATER Amlodipine (Norvasc), Carvedilol (Coreg)  Stop taking Asprin, BC's, Goody's, Herbal medications, Fish Oil, Ibuprofen, Advil, motrin, Aleve   Do not wear jewelry, make-up or nail polish.  Do not wear lotions, powders, or perfumes, or deoderant.  Do not shave 48 hours prior to surgery.  Men may shave face and neck.  Do not bring valuables to the hospital.  [facility] is not responsible for any belongings or valuables.  Contacts, dentures or bridgework may not be worn into surgery.  Leave your suitcase in the car.  After surgery it may be brought to your room.  For patients admitted to the hospital, discharge time will be determined by your treatment team.  Patients discharged the day of surgery will not be allowed to drive home.   Special instructions:  [facility] - Preparing for Surgery  Before surgery, you can play an important role.  Because skin is not sterile, your skin needs to be as free of germs as possible.  You can reduce the number of germs on you skin by washing with CHG (chlorahexidine gluconate) soap before surgery.  CHG is an antiseptic cleaner which kills germs and bonds with the skin to continue killing germs even after washing.  Please DO NOT use if you have an allergy to CHG or antibacterial soaps.  If your skin becomes reddened/irritated stop using the CHG and inform your nurse when you arrive at Short Stay.  Do not shave (including legs and  underarms) for at least 48 hours prior to the first CHG shower.  You may shave your face.  Please follow these instructions carefully:   1.  Shower with CHG Soap the night before surgery and the    morning of Surgery.  2.  If you choose to wash your hair, wash your hair first as usual with your  normal shampoo.  3.  After you shampoo, rinse your hair and body thoroughly to remove the  Shampoo.  4.  Use CHG as you would any other liquid soap.  You can apply chg directly to the skin and wash gently with scrungie or a clean washcloth.  5.  Apply the CHG Soap to your body ONLY FROM THE NECK DOWN.   Do not use on open wounds or open sores.  Avoid contact with your eyes,       ears, mouth and genitals (private parts).  Wash genitals (private parts) with your normal soap.  6.  Wash thoroughly, paying special attention to the area where your surgery  will be performed.  7.  Thoroughly rinse your body with warm water from the neck down.  8.  DO NOT shower/wash with your normal soap after using and rinsing off   the CHG Soap.  9.  Pat yourself dry with a clean towel.            10.  Wear clean pajamas.  11.  Place clean sheets on your bed the night of your first shower and do not sleep with pets.  Day of Surgery  Do not apply any lotions/deoderants the morning of surgery.  Please wear clean clothes to the hospital/surgery center.     Please read over the following fact sheets that you were given. Coughing and Deep Breathing, MRSA Information and Surgical Site Infection Prevention

## 2017-01-11 NOTE — Progress Notes (Signed)
PCP is Dr. Georgann HousekeeperKarrar Husain States he saw Dr Eldridge DaceVaranasi in 2014 due to edema States his Bp goes up when he goes to the hospital or Dr. Denies any chest pain. Echo noted from 2014 Denies ever having a card cath or stress test.

## 2017-01-12 NOTE — Progress Notes (Addendum)
Anesthesia Chart Review: Patient is a 46 year old male scheduled for right TKA on 01/18/17 by Dr. Linna CapriceSwinteck.   History includes non-smoker, HTN with white coat syndrome, CHF, arthritis, gout, left THA 08/31/16 ([facility]).   In review of vitals on arrival for left THA 08/31/16, BP was 219/123 at 6:54 AM and 209/90 at 6:58 AM. Post op vitals (while on orthopedic unit) X 4 178/111, 168/92, 173/101, 145/71.)  PCP Dr. Georgann HousekeeperKarrar Husain. Will request records.  He saw Dr. Eldridge DaceVaranasi in 2014 for "edema." Reportedly only saw once. Will see if I can get those records.  Meds include amlodipine 5 mg, Coreg 25 mg, Lasix 40 mg, losartan 100 mg, KCl 10 mEq.  BP (!) 184/95   Pulse 83   Temp 37.2 C   Resp 20   Ht 6\' 3"  (1.905 m)   Wt (!) 343 lb 4.8 oz (155.7 kg)   SpO2 98%   BMI 42.91 kg/m   EKG 08/21/16: NSR, moderate voltage criteria for LVH,   Echo 04/18/13: LVEF 40-45%, mildly reduced LVF, mild concentric LVH, mild LAE, fluid overload.   Preoperative labs noted.   Discussed poorly controlled HTN with anesthesiologist Dr. Noreene LarssonJoslin. Unclear how much white coat syndrome is contributing, but multiple hospital BP have been elevated. Dr. Donette LarryHusain did adjust his medication prior to his hip surgery in September. Based on current information, Dr. Noreene LarssonJoslin feels patient should have PCP evaluation for HTN. I will follow-up on records to make sure he has not been seen recently. I will also attempt to call patient as well.  Andre Ochsllison Hussien Greenblatt, PA-C [facility]/Anesthesiology Phone [phone] 01/12/2017 4:51 PM  Addendum: Last office visit with Dr. Donette LarryHusain was on 07/07/16. BP then 160/90, HR 88. Last office visit with Dr. Eldridge DaceVaranasi was 05/22/13 for CHF follow-up. It appears an stress test was ordered to due to EF 40-45% and ST elevation/early repolarization on EKG. (Records[facility]cardiac tests [facility], but no stress test received.) Dr. Abe PeopleVaransi did comment that patient's BP was 214/130 at the  office, but that patient reported BP at home 130/80's. Reviewed again with anesthesiologist Dr. Chaney MallingHodierne. Would recommend PCP preoperative evaluation. Would defer decision for additional referral or testing to Dr. Donette LarryHusain. I have notified Andre ConesLaurie at Dr. Kathline MagicSwinteck's office. He is seeing patient tomorrow for H&P. I also called and left a message with Dr. Glenice BowHusains' nurse Joni ReiningNicole to see if he could be re-evaluated prior to surgery and that I did not see that 2014 stress test was done, but would defer additional preoperative recommendations to Dr. Donette LarryHusain.  Andre Ochsllison Tynesha Free, PA-C [facility]Anesthesiology Phone [phone] 01/13/2017 4:44 PM  Addendum: I spoke with Joni ReiningNicole at Dr. Venita SheffieldHusain's office to confirm she got my message about needing office visit for preoperative evaluation regarding HTN and about no stress test seen from 2014. Patient has an appointment scheduled for 01/15/17 at 3:30 PM. Will leave chart regarding follow-up clearance status.  Andre Ochsllison Chaim Gatley, PA-C [facility]Anesthesiology Phone [phone] 01/15/2017 3:39 PM  Addendum: Records received from patient's 01/15/17 visit with Dr. Eula ListenHussain. He felt patient was medically stable except for for BP (BP 172/100, 168/100). He increased amlodipine to 10 mg daily and added hydralazine 25 mg BID. He wrote, "If SBP < 160 or DBP < 100 OK for surgery. If not need to postpone. Medications adjusted." BP this morning 148/81.   Andre Ochsllison Taj Arteaga, PA-C [facility]/Anesthesiology Phone [phone] 01/18/2017 10:27 AM

## 2017-01-14 ENCOUNTER — Ambulatory Visit: Payer: Self-pay | Admitting: Orthopedic Surgery

## 2017-01-14 NOTE — H&P (Signed)
TOTAL KNEE ADMISSION H&P  Patient is being admitted for right total knee arthroplasty.  Subjective:  Chief Complaint:right knee pain.  HPI: Andre Hoffman, 46 y.o. male, has a history of pain and functional disability in the right knee due to arthritis and has failed non-surgical conservative treatments for greater than 12 weeks to includeNSAID's and/or analgesics, corticosteriod injections, viscosupplementation injections, flexibility and strengthening excercises, supervised PT with diminished ADL's post treatment, use of assistive devices, weight reduction as appropriate and activity modification.  Onset of symptoms was abrupt, starting 1 years ago with rapidlly worsening course since that time. The patient noted no past surgery on the right knee(s).  Patient currently rates pain in the right knee(s) at 10 out of 10 with activity. Patient has night pain, worsening of pain with activity and weight bearing, pain that interferes with activities of daily living, pain with passive range of motion, crepitus and joint swelling.  Patient has evidence of subchondral cysts, subchondral sclerosis, periarticular osteophytes and joint space narrowing by imaging studies. There is no active infection.  Patient Active Problem List   Diagnosis Date Noted  . Primary osteoarthritis of left hip 08/31/2016  . Essential hypertension, benign 08/08/2014  . Obesity, unspecified 08/08/2014  . Congestive heart failure, unspecified 08/08/2014   Past Medical History:  Diagnosis Date  . Arthritis   . CHF (congestive heart failure) (HCC)    Echo- 2014- EF 40-45%; mild LVH.Takes Lasix daily  . History of bronchitis    3 yrs ago  . History of gout    only once   . Hypertension    takes Coreg and Losartan daily  . Joint pain   . White coat hypertension     Past Surgical History:  Procedure Laterality Date  . TOTAL HIP ARTHROPLASTY Left 08/31/2016   Procedure: LEFT TOTAL HIP ARTHROPLASTY ANTERIOR APPROACH;  Surgeon:  Samson FredericBrian Shermika Balthaser, MD;  Location: [facility] OR;  Service: Orthopedics;  Laterality: Left;  Requesting RNFA  . TRICEPS TENDON REPAIR Bilateral      (Not in a hospital admission) Allergies  Allergen Reactions  . Lisinopril Swelling    Angioedema  . Penicillins Swelling    As a child Has patient had a PCN reaction causing immediate rash, facial/tongue/throat swelling, SOB or lightheadedness with hypotension: Unknown Has patient had a PCN reaction causing severe rash involving mucus membranes or skin necrosis: Unknown Has patient had a PCN reaction that required hospitalization: Unknown Has patient had a PCN reaction occurring within the last 10 years: No If all of the above answers are "NO", then may proceed with Cephalosporin use.      Social History  Substance Use Topics  . Smoking status: Never Smoker  . Smokeless tobacco: Never Used  . Alcohol use No    Family History  Problem Relation Age of Onset  . Other Father     MVA  . Hypertension Paternal Grandfather   . Other Paternal Grandfather     cardiac arrest  . Diabetes Paternal Grandfather   . Heart attack Paternal Grandfather      Review of Systems  Constitutional: Negative.   HENT: Negative.   Eyes: Negative.   Respiratory: Negative.   Cardiovascular: Negative.   Gastrointestinal: Negative.   Genitourinary: Negative.   Musculoskeletal: Positive for joint pain.  Skin: Negative.   Neurological: Negative.   Endo/Heme/Allergies: Negative.   Psychiatric/Behavioral: Negative.     Objective:  Physical Exam  Vitals reviewed. Constitutional: He is oriented to person, place, and time. He appears  well-developed and well-nourished.  HENT:  Head: Normocephalic and atraumatic.  Eyes: Conjunctivae and EOM are normal. Pupils are equal, round, and reactive to light.  Neck: Normal range of motion. Neck supple.  Cardiovascular: Normal rate, regular rhythm and intact distal pulses.   Respiratory: Effort normal. No respiratory  distress.  GI: Soft. He exhibits no distension.  Genitourinary:  Genitourinary Comments: deferred  Musculoskeletal:       Right knee: He exhibits decreased range of motion, deformity and abnormal alignment. Tenderness found. Medial joint line and lateral joint line tenderness noted.  Neurological: He is alert and oriented to person, place, and time. He has normal reflexes.  Skin: Skin is warm and dry.  Psychiatric: He has a normal mood and affect. His behavior is normal. Judgment and thought content normal.    Vital signs in last 24 hours: @VSRANGES @  Labs:   Estimated body mass index is 42.91 kg/m as calculated from the following:   Height as of 01/11/17: 6\' 3"  (1.905 m).   Weight as of 01/11/17: 155.7 kg (343 lb 4.8 oz).   Imaging Review Plain radiographs demonstrate severe degenerative joint disease of the right knee(s). The overall alignment issignificant valgus. The bone quality appears to be adequate for age and reported activity level.  Assessment/Plan:  End stage arthritis, right knee   The patient history, physical examination, clinical judgment of the provider and imaging studies are consistent with end stage degenerative joint disease of the right knee(s) and total knee arthroplasty is deemed medically necessary. The treatment options including medical management, injection therapy arthroscopy and arthroplasty were discussed at length. The risks and benefits of total knee arthroplasty were presented and reviewed. The risks due to aseptic loosening, infection, stiffness, patella tracking problems, thromboembolic complications and other imponderables were discussed. The patient acknowledged the explanation, agreed to proceed with the plan and consent was signed. Patient is being admitted for inpatient treatment for surgery, pain control, PT, OT, prophylactic antibiotics, VTE prophylaxis, progressive ambulation and ADL's and discharge planning. The patient is planning to be  discharged home with home health services

## 2017-01-15 DIAGNOSIS — I10 Essential (primary) hypertension: Secondary | ICD-10-CM | POA: Diagnosis not present

## 2017-01-15 DIAGNOSIS — M179 Osteoarthritis of knee, unspecified: Secondary | ICD-10-CM | POA: Diagnosis not present

## 2017-01-15 DIAGNOSIS — I5022 Chronic systolic (congestive) heart failure: Secondary | ICD-10-CM | POA: Diagnosis not present

## 2017-01-15 MED ORDER — SODIUM CHLORIDE 0.9 % IV SOLN: INTRAVENOUS | Status: DC

## 2017-01-15 MED ORDER — VANCOMYCIN HCL 10 G IV SOLR
1500.0000 mg | INTRAVENOUS | Status: AC
  Administered 2017-01-18 (×2): 1500 mg via INTRAVENOUS
  Filled 2017-01-15: qty 1500

## 2017-01-15 MED ORDER — TRANEXAMIC ACID 1000 MG/10ML IV SOLN
1000.0000 mg | INTRAVENOUS | Status: AC
  Administered 2017-01-18: 1000 mg via INTRAVENOUS
  Filled 2017-01-15: qty 10

## 2017-01-15 MED ORDER — ACETAMINOPHEN 10 MG/ML IV SOLN: 1000.0000 mg | INTRAVENOUS | Status: DC

## 2017-01-18 ENCOUNTER — Encounter (HOSPITAL_COMMUNITY)
Admission: RE | Disposition: A | Discharge status: Home Health | Payer: Self-pay | Source: Ambulatory Visit | Attending: Orthopedic Surgery

## 2017-01-18 ENCOUNTER — Inpatient Hospital Stay (HOSPITAL_COMMUNITY): Payer: 59

## 2017-01-18 ENCOUNTER — Encounter (HOSPITAL_COMMUNITY): Payer: Self-pay | Admitting: *Deleted

## 2017-01-18 ENCOUNTER — Inpatient Hospital Stay (HOSPITAL_COMMUNITY)
Admission: RE | Admit: 2017-01-18 | Discharge: 2017-01-24 | DRG: 469 | Disposition: A | Discharge status: Home Health | Payer: 59 | Source: Ambulatory Visit | Attending: Orthopedic Surgery | Admitting: Orthopedic Surgery

## 2017-01-18 ENCOUNTER — Inpatient Hospital Stay (HOSPITAL_COMMUNITY): Payer: 59 | Admitting: Vascular Surgery

## 2017-01-18 ENCOUNTER — Inpatient Hospital Stay (HOSPITAL_COMMUNITY): Payer: 59 | Admitting: Anesthesiology

## 2017-01-18 DIAGNOSIS — G8918 Other acute postprocedural pain: Secondary | ICD-10-CM | POA: Diagnosis not present

## 2017-01-18 DIAGNOSIS — Z880 Allergy status to penicillin: Secondary | ICD-10-CM | POA: Diagnosis not present

## 2017-01-18 DIAGNOSIS — I10 Essential (primary) hypertension: Secondary | ICD-10-CM | POA: Diagnosis present

## 2017-01-18 DIAGNOSIS — M1711 Unilateral primary osteoarthritis, right knee: Principal | ICD-10-CM | POA: Diagnosis present

## 2017-01-18 DIAGNOSIS — I5022 Chronic systolic (congestive) heart failure: Secondary | ICD-10-CM | POA: Diagnosis not present

## 2017-01-18 DIAGNOSIS — Z471 Aftercare following joint replacement surgery: Secondary | ICD-10-CM | POA: Diagnosis not present

## 2017-01-18 DIAGNOSIS — N170 Acute kidney failure with tubular necrosis: Secondary | ICD-10-CM | POA: Diagnosis present

## 2017-01-18 DIAGNOSIS — I959 Hypotension, unspecified: Secondary | ICD-10-CM | POA: Diagnosis not present

## 2017-01-18 DIAGNOSIS — Z23 Encounter for immunization: Secondary | ICD-10-CM | POA: Diagnosis not present

## 2017-01-18 DIAGNOSIS — Z6841 Body Mass Index (BMI) 40.0 and over, adult: Secondary | ICD-10-CM

## 2017-01-18 DIAGNOSIS — Z96642 Presence of left artificial hip joint: Secondary | ICD-10-CM | POA: Diagnosis present

## 2017-01-18 DIAGNOSIS — Z7982 Long term (current) use of aspirin: Secondary | ICD-10-CM

## 2017-01-18 DIAGNOSIS — Z09 Encounter for follow-up examination after completed treatment for conditions other than malignant neoplasm: Secondary | ICD-10-CM

## 2017-01-18 DIAGNOSIS — D62 Acute posthemorrhagic anemia: Secondary | ICD-10-CM

## 2017-01-18 DIAGNOSIS — M25561 Pain in right knee: Secondary | ICD-10-CM | POA: Diagnosis not present

## 2017-01-18 DIAGNOSIS — N182 Chronic kidney disease, stage 2 (mild): Secondary | ICD-10-CM | POA: Diagnosis not present

## 2017-01-18 DIAGNOSIS — Z888 Allergy status to other drugs, medicaments and biological substances status: Secondary | ICD-10-CM | POA: Diagnosis not present

## 2017-01-18 DIAGNOSIS — Z96651 Presence of right artificial knee joint: Secondary | ICD-10-CM | POA: Diagnosis not present

## 2017-01-18 DIAGNOSIS — I509 Heart failure, unspecified: Secondary | ICD-10-CM

## 2017-01-18 DIAGNOSIS — I130 Hypertensive heart and chronic kidney disease with heart failure and stage 1 through stage 4 chronic kidney disease, or unspecified chronic kidney disease: Secondary | ICD-10-CM | POA: Diagnosis not present

## 2017-01-18 DIAGNOSIS — N179 Acute kidney failure, unspecified: Secondary | ICD-10-CM | POA: Diagnosis not present

## 2017-01-18 DIAGNOSIS — Z79899 Other long term (current) drug therapy: Secondary | ICD-10-CM

## 2017-01-18 DIAGNOSIS — N189 Chronic kidney disease, unspecified: Secondary | ICD-10-CM

## 2017-01-18 DIAGNOSIS — E861 Hypovolemia: Secondary | ICD-10-CM | POA: Diagnosis not present

## 2017-01-18 HISTORY — PX: KNEE ARTHROPLASTY: SHX992

## 2017-01-18 HISTORY — PX: TOTAL KNEE ARTHROPLASTY: SHX125

## 2017-01-18 SURGERY — ARTHROPLASTY, KNEE, TOTAL, USING IMAGELESS COMPUTER-ASSISTED NAVIGATION
Anesthesia: General | Site: Knee | Laterality: Right

## 2017-01-18 MED ORDER — PHENYLEPHRINE 40 MCG/ML (10ML) SYRINGE FOR IV PUSH (FOR BLOOD PRESSURE SUPPORT)
PREFILLED_SYRINGE | INTRAVENOUS | Status: AC
  Filled 2017-01-18: qty 10

## 2017-01-18 MED ORDER — HYDROCODONE-ACETAMINOPHEN 5-325 MG PO TABS
1.0000 | ORAL_TABLET | ORAL | Status: DC | PRN
Start: 2017-01-18 — End: 2017-01-24
  Administered 2017-01-18 – 2017-01-24 (×16): 2 via ORAL
  Filled 2017-01-18 (×16): qty 2

## 2017-01-18 MED ORDER — SODIUM CHLORIDE 0.9 % IR SOLN
Status: DC | PRN
  Administered 2017-01-18 (×2): 3000 mL

## 2017-01-18 MED ORDER — KETOROLAC TROMETHAMINE 30 MG/ML IJ SOLN
INTRAMUSCULAR | Status: DC | PRN
  Administered 2017-01-18: 30 mg via INTRAMUSCULAR

## 2017-01-18 MED ORDER — HYDROMORPHONE HCL 1 MG/ML IJ SOLN
INTRAMUSCULAR | Status: AC
  Filled 2017-01-18: qty 1

## 2017-01-18 MED ORDER — FUROSEMIDE 20 MG PO TABS
20.0000 mg | ORAL_TABLET | Freq: Every day | ORAL | Status: DC
  Administered 2017-01-19 – 2017-01-21 (×3): 20 mg via ORAL
  Filled 2017-01-18 (×3): qty 1

## 2017-01-18 MED ORDER — HYDROMORPHONE HCL 2 MG/ML IJ SOLN
0.5000 mg | INTRAMUSCULAR | Status: DC | PRN
  Administered 2017-01-18 – 2017-01-23 (×4): 1 mg via INTRAVENOUS
  Filled 2017-01-18 (×4): qty 1

## 2017-01-18 MED ORDER — ROCURONIUM BROMIDE 50 MG/5ML IV SOSY
PREFILLED_SYRINGE | INTRAVENOUS | Status: AC
  Filled 2017-01-18: qty 5

## 2017-01-18 MED ORDER — LACTATED RINGERS IV SOLN
INTRAVENOUS | Status: DC
  Administered 2017-01-18 (×3): via INTRAVENOUS

## 2017-01-18 MED ORDER — PHENOL 1.4 % MT LIQD: 1.0000 | OROMUCOSAL | Status: DC | PRN

## 2017-01-18 MED ORDER — EPHEDRINE 5 MG/ML INJ
INTRAVENOUS | Status: AC
  Filled 2017-01-18: qty 10

## 2017-01-18 MED ORDER — KETOROLAC TROMETHAMINE 15 MG/ML IJ SOLN
15.0000 mg | Freq: Four times a day (QID) | INTRAMUSCULAR | Status: AC
  Administered 2017-01-18 – 2017-01-19 (×3): 15 mg via INTRAVENOUS
  Filled 2017-01-18 (×3): qty 1

## 2017-01-18 MED ORDER — DIPHENHYDRAMINE HCL 50 MG/ML IJ SOLN
INTRAMUSCULAR | Status: DC | PRN
  Administered 2017-01-18: 25 mg via INTRAVENOUS

## 2017-01-18 MED ORDER — POTASSIUM CHLORIDE CRYS ER 10 MEQ PO TBCR
10.0000 meq | EXTENDED_RELEASE_TABLET | Freq: Every day | ORAL | Status: DC
  Administered 2017-01-19 – 2017-01-24 (×6): 10 meq via ORAL
  Filled 2017-01-18 (×6): qty 1

## 2017-01-18 MED ORDER — SUCCINYLCHOLINE CHLORIDE 200 MG/10ML IV SOSY
PREFILLED_SYRINGE | INTRAVENOUS | Status: AC
  Filled 2017-01-18: qty 10

## 2017-01-18 MED ORDER — MIDAZOLAM HCL 2 MG/2ML IJ SOLN
INTRAMUSCULAR | Status: AC
  Administered 2017-01-18: 2 mg
  Filled 2017-01-18: qty 2

## 2017-01-18 MED ORDER — LOSARTAN POTASSIUM 50 MG PO TABS
100.0000 mg | ORAL_TABLET | Freq: Every day | ORAL | Status: DC
  Administered 2017-01-18 – 2017-01-21 (×4): 100 mg via ORAL
  Filled 2017-01-18 (×5): qty 2

## 2017-01-18 MED ORDER — DOCUSATE SODIUM 100 MG PO CAPS
100.0000 mg | ORAL_CAPSULE | Freq: Two times a day (BID) | ORAL | Status: DC
  Administered 2017-01-18 – 2017-01-24 (×12): 100 mg via ORAL
  Filled 2017-01-18 (×12): qty 1

## 2017-01-18 MED ORDER — POVIDONE-IODINE 10 % EX SWAB: 2.0000 "application " | Freq: Once | CUTANEOUS | Status: DC

## 2017-01-18 MED ORDER — TRANEXAMIC ACID 1000 MG/10ML IV SOLN
1000.0000 mg | Freq: Once | INTRAVENOUS | Status: AC
  Administered 2017-01-18: 1000 mg via INTRAVENOUS
  Filled 2017-01-18: qty 10

## 2017-01-18 MED ORDER — ALUM & MAG HYDROXIDE-SIMETH 200-200-20 MG/5ML PO SUSP: 30.0000 mL | ORAL | Status: DC | PRN

## 2017-01-18 MED ORDER — MIDAZOLAM HCL 2 MG/2ML IJ SOLN
INTRAMUSCULAR | Status: AC
  Filled 2017-01-18: qty 2

## 2017-01-18 MED ORDER — LIDOCAINE HCL (CARDIAC) 20 MG/ML IV SOLN
INTRAVENOUS | Status: DC | PRN
  Administered 2017-01-18: 80 mg via INTRATRACHEAL

## 2017-01-18 MED ORDER — METOCLOPRAMIDE HCL 5 MG/ML IJ SOLN: 5.0000 mg | Freq: Three times a day (TID) | INTRAMUSCULAR | Status: DC | PRN

## 2017-01-18 MED ORDER — ALBUMIN HUMAN 5 % IV SOLN
INTRAVENOUS | Status: DC | PRN
  Administered 2017-01-18 (×2): via INTRAVENOUS

## 2017-01-18 MED ORDER — BUPIVACAINE-EPINEPHRINE (PF) 0.5% -1:200000 IJ SOLN
INTRAMUSCULAR | Status: DC | PRN
  Administered 2017-01-18: 30 mL via PERINEURAL

## 2017-01-18 MED ORDER — KETOROLAC TROMETHAMINE 30 MG/ML IJ SOLN
INTRAMUSCULAR | Status: AC
  Filled 2017-01-18: qty 1

## 2017-01-18 MED ORDER — FENTANYL CITRATE (PF) 100 MCG/2ML IJ SOLN
INTRAMUSCULAR | Status: AC
  Filled 2017-01-18: qty 4

## 2017-01-18 MED ORDER — ACETAMINOPHEN 650 MG RE SUPP: 650.0000 mg | Freq: Four times a day (QID) | RECTAL | Status: DC | PRN

## 2017-01-18 MED ORDER — 0.9 % SODIUM CHLORIDE (POUR BTL) OPTIME
TOPICAL | Status: DC | PRN
  Administered 2017-01-18: 1000 mL

## 2017-01-18 MED ORDER — PHENYLEPHRINE HCL 10 MG/ML IJ SOLN
INTRAVENOUS | Status: DC | PRN
  Administered 2017-01-18: 20 ug/min via INTRAVENOUS

## 2017-01-18 MED ORDER — FENTANYL CITRATE (PF) 100 MCG/2ML IJ SOLN
INTRAMUSCULAR | Status: DC | PRN
  Administered 2017-01-18: 100 ug via INTRAVENOUS
  Administered 2017-01-18 (×2): 50 ug via INTRAVENOUS

## 2017-01-18 MED ORDER — ONDANSETRON HCL 4 MG/2ML IJ SOLN
INTRAMUSCULAR | Status: DC | PRN
  Administered 2017-01-18: 4 mg via INTRAVENOUS

## 2017-01-18 MED ORDER — SUCCINYLCHOLINE CHLORIDE 20 MG/ML IJ SOLN
INTRAMUSCULAR | Status: DC | PRN
  Administered 2017-01-18: 120 mg via INTRAVENOUS

## 2017-01-18 MED ORDER — ONDANSETRON HCL 4 MG PO TABS: 4.0000 mg | ORAL_TABLET | Freq: Four times a day (QID) | ORAL | Status: DC | PRN

## 2017-01-18 MED ORDER — PROPOFOL 10 MG/ML IV BOLUS
INTRAVENOUS | Status: DC | PRN
  Administered 2017-01-18: 200 mg via INTRAVENOUS

## 2017-01-18 MED ORDER — PHENYLEPHRINE HCL 10 MG/ML IJ SOLN
INTRAMUSCULAR | Status: DC | PRN
  Administered 2017-01-18 (×3): 120 ug via INTRAVENOUS
  Administered 2017-01-18: 40 ug via INTRAVENOUS

## 2017-01-18 MED ORDER — METOCLOPRAMIDE HCL 5 MG PO TABS: 5.0000 mg | ORAL_TABLET | Freq: Three times a day (TID) | ORAL | Status: DC | PRN

## 2017-01-18 MED ORDER — SUGAMMADEX SODIUM 200 MG/2ML IV SOLN
INTRAVENOUS | Status: DC | PRN
  Administered 2017-01-18: 200 mg via INTRAVENOUS

## 2017-01-18 MED ORDER — CARVEDILOL 25 MG PO TABS
25.0000 mg | ORAL_TABLET | Freq: Two times a day (BID) | ORAL | Status: DC
  Administered 2017-01-18 – 2017-01-24 (×12): 25 mg via ORAL
  Filled 2017-01-18 (×12): qty 1

## 2017-01-18 MED ORDER — POLYETHYLENE GLYCOL 3350 17 G PO PACK
17.0000 g | PACK | Freq: Every day | ORAL | Status: DC | PRN
  Administered 2017-01-21: 17 g via ORAL
  Filled 2017-01-18: qty 1

## 2017-01-18 MED ORDER — METHOCARBAMOL 1000 MG/10ML IJ SOLN
500.0000 mg | Freq: Four times a day (QID) | INTRAVENOUS | Status: DC | PRN
  Administered 2017-01-18: 500 mg via INTRAVENOUS
  Filled 2017-01-18 (×2): qty 5

## 2017-01-18 MED ORDER — ONDANSETRON HCL 4 MG/2ML IJ SOLN: 4.0000 mg | Freq: Four times a day (QID) | INTRAMUSCULAR | Status: DC | PRN

## 2017-01-18 MED ORDER — DIPHENHYDRAMINE HCL 12.5 MG/5ML PO ELIX: 12.5000 mg | ORAL_SOLUTION | ORAL | Status: DC | PRN

## 2017-01-18 MED ORDER — FENTANYL CITRATE (PF) 100 MCG/2ML IJ SOLN
INTRAMUSCULAR | Status: AC
  Administered 2017-01-18: 100 ug
  Filled 2017-01-18: qty 2

## 2017-01-18 MED ORDER — ASPIRIN 81 MG PO CHEW
81.0000 mg | CHEWABLE_TABLET | Freq: Two times a day (BID) | ORAL | Status: DC
  Administered 2017-01-18 – 2017-01-24 (×12): 81 mg via ORAL
  Filled 2017-01-18 (×12): qty 1

## 2017-01-18 MED ORDER — EPHEDRINE SULFATE 50 MG/ML IJ SOLN
INTRAMUSCULAR | Status: DC | PRN
  Administered 2017-01-18 (×2): 7.5 mg via INTRAVENOUS

## 2017-01-18 MED ORDER — SODIUM CHLORIDE 0.9 % IJ SOLN
INTRAMUSCULAR | Status: DC | PRN
  Administered 2017-01-18 (×3): 10 mL

## 2017-01-18 MED ORDER — METHOCARBAMOL 500 MG PO TABS
500.0000 mg | ORAL_TABLET | Freq: Four times a day (QID) | ORAL | Status: DC | PRN
  Administered 2017-01-18 – 2017-01-24 (×9): 500 mg via ORAL
  Filled 2017-01-18 (×10): qty 1

## 2017-01-18 MED ORDER — SENNA 8.6 MG PO TABS
2.0000 | ORAL_TABLET | Freq: Every day | ORAL | Status: DC
  Administered 2017-01-18 – 2017-01-23 (×6): 17.2 mg via ORAL
  Filled 2017-01-18 (×6): qty 2

## 2017-01-18 MED ORDER — VANCOMYCIN HCL IN DEXTROSE 1-5 GM/200ML-% IV SOLN
1000.0000 mg | Freq: Two times a day (BID) | INTRAVENOUS | Status: AC
  Administered 2017-01-18: 1000 mg via INTRAVENOUS
  Filled 2017-01-18: qty 200

## 2017-01-18 MED ORDER — ACETAMINOPHEN 325 MG PO TABS: 650.0000 mg | ORAL_TABLET | Freq: Four times a day (QID) | ORAL | Status: DC | PRN

## 2017-01-18 MED ORDER — AMLODIPINE BESYLATE 5 MG PO TABS
5.0000 mg | ORAL_TABLET | Freq: Every day | ORAL | Status: DC
  Administered 2017-01-20 – 2017-01-22 (×3): 5 mg via ORAL
  Filled 2017-01-18 (×4): qty 1

## 2017-01-18 MED ORDER — CHLORHEXIDINE GLUCONATE 4 % EX LIQD: 60.0000 mL | Freq: Once | CUTANEOUS | Status: DC

## 2017-01-18 MED ORDER — HYDROCODONE-ACETAMINOPHEN 5-325 MG PO TABS
ORAL_TABLET | ORAL | Status: AC
  Filled 2017-01-18: qty 2

## 2017-01-18 MED ORDER — PROPOFOL 10 MG/ML IV BOLUS
INTRAVENOUS | Status: AC
  Filled 2017-01-18: qty 20

## 2017-01-18 MED ORDER — SODIUM CHLORIDE 0.9 % IV SOLN
INTRAVENOUS | Status: DC
  Administered 2017-01-18: 21:00:00 via INTRAVENOUS

## 2017-01-18 MED ORDER — SUGAMMADEX SODIUM 200 MG/2ML IV SOLN
INTRAVENOUS | Status: AC
  Filled 2017-01-18: qty 2

## 2017-01-18 MED ORDER — DEXAMETHASONE SODIUM PHOSPHATE 10 MG/ML IJ SOLN
10.0000 mg | Freq: Once | INTRAMUSCULAR | Status: AC
  Administered 2017-01-19: 10 mg via INTRAVENOUS
  Filled 2017-01-18: qty 1

## 2017-01-18 MED ORDER — ROCURONIUM BROMIDE 100 MG/10ML IV SOLN
INTRAVENOUS | Status: DC | PRN
  Administered 2017-01-18 (×2): 20 mg via INTRAVENOUS
  Administered 2017-01-18: 50 mg via INTRAVENOUS

## 2017-01-18 MED ORDER — MIDAZOLAM HCL 5 MG/5ML IJ SOLN
INTRAMUSCULAR | Status: DC | PRN
  Administered 2017-01-18: 2 mg via INTRAVENOUS

## 2017-01-18 MED ORDER — MENTHOL 3 MG MT LOZG: 1.0000 | LOZENGE | OROMUCOSAL | Status: DC | PRN

## 2017-01-18 MED ORDER — ONDANSETRON HCL 4 MG/2ML IJ SOLN
INTRAMUSCULAR | Status: AC
  Filled 2017-01-18: qty 2

## 2017-01-18 MED ORDER — HYDROMORPHONE HCL 1 MG/ML IJ SOLN
0.2500 mg | INTRAMUSCULAR | Status: DC | PRN
  Administered 2017-01-18 (×4): 0.5 mg via INTRAVENOUS

## 2017-01-18 SURGICAL SUPPLY — 69 items
ALCOHOL ISOPROPYL (RUBBING) (MISCELLANEOUS) ×3 IMPLANT
BANDAGE ACE 6X5 VEL STRL LF (GAUZE/BANDAGES/DRESSINGS) ×3 IMPLANT
BATTERY INSTRU NAVIGATION (MISCELLANEOUS) ×9 IMPLANT
BLADE SAW RECIP 87.9 MT (BLADE) ×3 IMPLANT
BLADE SURG 10 STRL SS (BLADE) ×9 IMPLANT
BONE CEMENT SIMPLEX TOBRAMYCIN (Cement) ×4 IMPLANT
CAPT KNEE TRIATH TK-4 ×3 IMPLANT
CEMENT BONE SIMPLEX TOBRAMYCIN (Cement) ×2 IMPLANT
CHLORAPREP W/TINT 26ML (MISCELLANEOUS) ×6 IMPLANT
CUFF TOURNIQUET SINGLE 34IN LL (TOURNIQUET CUFF) ×3 IMPLANT
DERMABOND ADVANCED (GAUZE/BANDAGES/DRESSINGS) ×2
DERMABOND ADVANCED .7 DNX12 (GAUZE/BANDAGES/DRESSINGS) ×1 IMPLANT
DRAIN HEMOVAC 7FR (DRAIN) IMPLANT
DRAPE EXTREMITY T 121X128X90 (DRAPE) ×3 IMPLANT
DRAPE INCISE IOBAN 66X45 STRL (DRAPES) ×3 IMPLANT
DRAPE ORTHO SPLIT 77X108 STRL (DRAPES) ×2
DRAPE SHEET LG 3/4 BI-LAMINATE (DRAPES) ×6 IMPLANT
DRAPE SURG ORHT 6 SPLT 77X108 (DRAPES) ×1 IMPLANT
DRAPE U-SHAPE 47X51 STRL (DRAPES) ×3 IMPLANT
DRAPE UNIVERSAL PACK (DRAPES) IMPLANT
DRSG AQUACEL AG ADV 3.5X14 (GAUZE/BANDAGES/DRESSINGS) ×3 IMPLANT
DRSG MEPILEX BORDER 4X4 (GAUZE/BANDAGES/DRESSINGS) ×3 IMPLANT
DRSG TEGADERM 2-3/8X2-3/4 SM (GAUZE/BANDAGES/DRESSINGS) IMPLANT
ELECT BLADE 4.0 EZ CLEAN MEGAD (MISCELLANEOUS) ×3
ELECT REM PT RETURN 9FT ADLT (ELECTROSURGICAL) ×3
ELECTRODE BLDE 4.0 EZ CLN MEGD (MISCELLANEOUS) ×1 IMPLANT
ELECTRODE REM PT RTRN 9FT ADLT (ELECTROSURGICAL) ×1 IMPLANT
EVACUATOR 1/8 PVC DRAIN (DRAIN) ×3 IMPLANT
GAUZE SPONGE 4X4 12PLY STRL (GAUZE/BANDAGES/DRESSINGS) ×3 IMPLANT
GLOVE BIO SURGEON STRL SZ8.5 (GLOVE) ×6 IMPLANT
GLOVE BIOGEL PI IND STRL 6.5 (GLOVE) ×1 IMPLANT
GLOVE BIOGEL PI IND STRL 8 (GLOVE) ×2 IMPLANT
GLOVE BIOGEL PI IND STRL 8.5 (GLOVE) ×2 IMPLANT
GLOVE BIOGEL PI INDICATOR 6.5 (GLOVE) ×2
GLOVE BIOGEL PI INDICATOR 8 (GLOVE) ×4
GLOVE BIOGEL PI INDICATOR 8.5 (GLOVE) ×4
GLOVE SURG SS PI 6.5 STRL IVOR (GLOVE) ×3 IMPLANT
GLOVE SURG SS PI 7.0 STRL IVOR (GLOVE) ×9 IMPLANT
GLOVE SURG SS PI 8.0 STRL IVOR (GLOVE) ×6 IMPLANT
GOWN STRL REUS W/ TWL LRG LVL3 (GOWN DISPOSABLE) ×2 IMPLANT
GOWN STRL REUS W/TWL 2XL LVL3 (GOWN DISPOSABLE) ×3 IMPLANT
GOWN STRL REUS W/TWL LRG LVL3 (GOWN DISPOSABLE) ×4
HANDPIECE INTERPULSE COAX TIP (DISPOSABLE) ×2
HOOD PEEL AWAY FLYTE STAYCOOL (MISCELLANEOUS) ×6 IMPLANT
KIT BASIN OR (CUSTOM PROCEDURE TRAY) ×3 IMPLANT
MANIFOLD NEPTUNE II (INSTRUMENTS) ×3 IMPLANT
MARKER SKIN DUAL TIP RULER LAB (MISCELLANEOUS) ×3 IMPLANT
NEEDLE SPNL 18GX3.5 QUINCKE PK (NEEDLE) ×3 IMPLANT
PACK TOTAL JOINT (CUSTOM PROCEDURE TRAY) ×3 IMPLANT
PACK TOTAL KNEE CUSTOM (KITS) ×3 IMPLANT
PADDING CAST COTTON 6X4 STRL (CAST SUPPLIES) IMPLANT
SAW OSC TIP CART 19.5X105X1.3 (SAW) ×3 IMPLANT
SEALER BIPOLAR AQUA 6.0 (INSTRUMENTS) ×3 IMPLANT
SET HNDPC FAN SPRY TIP SCT (DISPOSABLE) ×1 IMPLANT
SET PAD KNEE POSITIONER (MISCELLANEOUS) IMPLANT
STAPLER VISISTAT 35W (STAPLE) ×3 IMPLANT
SUT MNCRL AB 3-0 PS2 27 (SUTURE) ×3 IMPLANT
SUT MON AB 2-0 CT1 36 (SUTURE) ×6 IMPLANT
SUT VIC AB 1 CTX 27 (SUTURE) ×9 IMPLANT
SUT VIC AB 2-0 CT1 27 (SUTURE) ×2
SUT VIC AB 2-0 CT1 TAPERPNT 27 (SUTURE) ×1 IMPLANT
SUT VLOC 180 0 24IN GS25 (SUTURE) ×3 IMPLANT
SYR 50ML LL SCALE MARK (SYRINGE) ×3 IMPLANT
TOWER CARTRIDGE SMART MIX (DISPOSABLE) ×3 IMPLANT
TRAY CATH 16FR W/PLASTIC CATH (SET/KITS/TRAYS/PACK) IMPLANT
TUBE CONNECTING 12'X1/4 (SUCTIONS) ×1
TUBE CONNECTING 12X1/4 (SUCTIONS) ×2 IMPLANT
WRAP KNEE MAXI GEL POST OP (GAUZE/BANDAGES/DRESSINGS) ×3 IMPLANT
YANKAUER SUCT BULB TIP NO VENT (SUCTIONS) ×3 IMPLANT

## 2017-01-18 NOTE — Op Note (Signed)
OPERATIVE REPORT  SURGEON: Samson Frederic, MD   ASSISTANT: Hart Carwin, RNFA  PREOPERATIVE DIAGNOSIS: Right knee arthritis.   POSTOPERATIVE DIAGNOSIS: Right knee arthritis.   PROCEDURE: Right total knee arthroplasty.   IMPLANTS: Stryker Triathlon CR femur, size 8. Stryker universal tibia, size 8. X3 polyethelyene insert, size 9 mm, CR. 3 button asymmetric patella, size 40 mm. Simplex P bone cement.  ANESTHESIA:  General  TOURNIQUET TIME: 40 min at Hg.  ESTIMATED BLOOD LOSS: 700 ML.  ANTIBIOTICS: 1 g vancomycin.  DRAINS: Medium HV x1.  COMPLICATIONS: None   CONDITION: PACU - hemodynamically stable.   BRIEF CLINICAL NOTE: Andre Hoffman is a 46 y.o. male with a long-standing history of Right knee arthritis. After failing conservative management, the patient was indicated for total knee arthroplasty. The risks, benefits, and alternatives to the procedure were explained, and the patient elected to proceed.  PROCEDURE IN DETAIL: Spinal anesthesia was obtained in the pre-op holding area. Once inside the operative room, a foley catheter was inserted. The patient was then positioned, a nonsterile tourniquet was placed, and the lower extremity was prepped and draped in the normal sterile surgical fashion. A time-out was called verifying side and site of surgery. The patient received IV antibiotics within 60 minutes of beginning the procedure.   An anterior approach to the knee was performed utilizing a mid vastus arthrotomy. A medial release was performed and the patellar fat pad was excised. Stryker navigation was used to cut the distal femur perpendicular to the mechanical axis. A freehand patellar resection was performed, and the patella was sized an prepared with 3 lug holes.  Nagivation was used to make a neutral proximal tibia resection, taking 4 mm of bone from the less affected medial side with 3 degrees of slope. The menisci were excised. A spacer block was placed, and  the alignment and balance in extension were confirmed.   The distal femur was sized using the 3-degree external rotation guide referencing the posterior femoral cortex. The appropriate 4-in-1 cutting block was pinned into place. Rotation was checked using Whiteside's line, the epicondylar axis, and then confirmed with a spacer block in flexion. The remaining femoral cuts were performed, taking care to protect the MCL.  The tibia was sized and the trial tray was pinned into place. The remaining trail components were inserted. The knee was stable to varus and valgus stress through a full range of motion. The patella tracked centrally, and the PCL was well balanced. The trial components were removed, and the proximal tibial surface was prepared. The extremity was exsanguinated with an Esmarch, and the tourniquet was inflated. Small drill holes were made in the sclerotic subchondral bone.The cut bony surfaces were irrigated with pulse lavage. Final  tibial and patellar components were cemented into place and excess cement was cleared.  The femur was press-fit. The trial insert was placed, and the knee was brought into extension while the cement polymerized. Once the cement was hard, the knee was tested for a final time and found to be well balanced. The trial insert was exchanged for the real polyethylene insert.  The wound was copiously irrigated with a dilute betadine solution followed by normal saline with pulse lavage. Marcaine solution was injected into the periarticular soft tissue. The wound was closed in layers using #1 Vicryl and V-Loc for the fascia, 2-0 Vicryl for the subcutaneous fat, 2-0 Monocryl for the deep dermal layer, 3-0 running Monocryl subcuticular Stitch, and Dermabond for the skin. Once the glue  was fully dried, an Aquacell Ag and compressive dressing were applied. The tourniquet was let down, and the patient was transported to the recovery room in stable ondition. Sponge, needle, and  instrument counts were correct at the end of the case x2. The patient tolerated the procedure well and there were no known complications.

## 2017-01-18 NOTE — Anesthesia Procedure Notes (Signed)
Anesthesia Regional Block:  Adductor canal block  Pre-Anesthetic Checklist: ,, timeout performed, Correct Patient, Correct Site, Correct Laterality, Correct Procedure, Correct Position, site marked, Risks and benefits discussed,  Surgical consent,  Pre-op evaluation,  At surgeon's request and post-op pain management  Laterality: Right     Needles:  Injection technique: Single-shot  Needle Type: Stimulator Needle - 80          Additional Needles:  Procedures: Doppler guided and ultrasound guided (picture in chart) Adductor canal block Narrative:  Start time: 01/18/2017 10:40 AM End time: 01/18/2017 10:55 AM Injection made incrementally with aspirations every 5 mL.  Performed by: Personally  Anesthesiologist: Andre Hoffman, Andre Hoffman

## 2017-01-18 NOTE — Transfer of Care (Signed)
Immediate Anesthesia Transfer of Care Note  Patient: Mendel RyderJames II Plog  Procedure(s) Performed: Procedure(s): RIGHT TOTAL KNEE ARTHROPLASTY WITH COMPUTER NAVIGATION (Right)  Patient Location: PACU  Anesthesia Type:GA combined with regional for post-op pain  Level of Consciousness: awake, alert  and oriented  Airway & Oxygen Therapy: Patient Spontanous Breathing and Patient connected to nasal cannula oxygen  Post-op Assessment: Report given to RN, Post -op Vital signs reviewed and stable and Patient moving all extremities X 4  Post vital signs: Reviewed and stable  Last Vitals:  Vitals:   01/18/17 1114 01/18/17 1543  BP: (!) 154/93 118/71  Pulse: 81   Resp: (!) 22   Temp:  36.4 C    Last Pain:  Vitals:   01/18/17 1543  TempSrc:   PainSc: 8       Patients Stated Pain Goal: 2 (01/18/17 0959)  Complications: No apparent anesthesia complications

## 2017-01-18 NOTE — Anesthesia Postprocedure Evaluation (Signed)
Anesthesia Post Note  Patient: Andre Hoffman  Procedure(s) Performed: Procedure(s) (LRB): RIGHT TOTAL KNEE ARTHROPLASTY WITH COMPUTER NAVIGATION (Right)  Patient location during evaluation: PACU Anesthesia Type: General Level of consciousness: awake Pain management: pain level controlled Vital Signs Assessment: post-procedure vital signs reviewed and stable Respiratory status: spontaneous breathing Cardiovascular status: stable Anesthetic complications: no       Last Vitals:  Vitals:   01/18/17 1114 01/18/17 1543  BP: (!) 154/93 118/71  Pulse: 81   Resp: (!) 22   Temp:  36.4 C    Last Pain:  Vitals:   01/18/17 1543  TempSrc:   PainSc: 8                  Darrin Apodaca

## 2017-01-18 NOTE — Anesthesia Procedure Notes (Signed)
Procedure Name: Intubation Date/Time: 01/18/2017 11:39 AM Performed by: Marena ChancyBECKNER, Durante Violett S Pre-anesthesia Checklist: Patient identified, Emergency Drugs available, Suction available and Patient being monitored Patient Re-evaluated:Patient Re-evaluated prior to inductionOxygen Delivery Method: Circle System Utilized Preoxygenation: Pre-oxygenation with 100% oxygen Intubation Type: IV induction Ventilation: Mask ventilation without difficulty Laryngoscope Size: Miller and 3 Grade View: Grade II Tube type: Oral Tube size: 8.0 mm Number of attempts: 1 Airway Equipment and Method: Stylet and Oral airway Placement Confirmation: ETT inserted through vocal cords under direct vision,  positive ETCO2 and breath sounds checked- equal and bilateral Tube secured with: Tape Dental Injury: Teeth and Oropharynx as per pre-operative assessment

## 2017-01-18 NOTE — Anesthesia Preprocedure Evaluation (Addendum)
Anesthesia Evaluation  Patient identified by MRN, date of birth, ID band Patient awake    Reviewed: Allergy & Precautions, NPO status , Patient's Chart, lab work & pertinent test results  Airway Mallampati: II  TM Distance: >3 FB     Dental   Pulmonary neg pulmonary ROS,    breath sounds clear to auscultation       Cardiovascular hypertension, +CHF   Rhythm:Regular Rate:Normal     Neuro/Psych negative neurological ROS     GI/Hepatic negative GI ROS, Neg liver ROS,   Endo/Other  negative endocrine ROS  Renal/GU negative Renal ROS     Musculoskeletal  (+) Arthritis ,   Abdominal   Peds  Hematology   Anesthesia Other Findings   Reproductive/Obstetrics                            Anesthesia Physical Anesthesia Plan  ASA: III  Anesthesia Plan: General   Post-op Pain Management:  Regional for Post-op pain   Induction: Intravenous  Airway Management Planned: Oral ETT  Additional Equipment:   Intra-op Plan:   Post-operative Plan: Extubation in OR  Informed Consent:   Dental advisory given  Plan Discussed with: CRNA and Anesthesiologist  Anesthesia Plan Comments:        Anesthesia Quick Evaluation

## 2017-01-18 NOTE — Anesthesia Postprocedure Evaluation (Signed)
Anesthesia Post Note  Patient: Andre Hoffman  Procedure(s) Performed: Procedure(s) (LRB): RIGHT TOTAL KNEE ARTHROPLASTY WITH COMPUTER NAVIGATION (Right)  Patient location during evaluation: PACU Anesthesia Type: General Level of consciousness: awake Pain management: pain level controlled Vital Signs Assessment: post-procedure vital signs reviewed and stable Respiratory status: spontaneous breathing Anesthetic complications: no       Last Vitals:  Vitals:   01/18/17 2107 01/18/17 2127  BP: (!) 172/98 (!) 141/83  Pulse: 86 96  Resp: 18   Temp: 36.9 C 36.9 C    Last Pain:  Vitals:   01/18/17 2127  TempSrc: Oral  PainSc:                  Austine Wiedeman

## 2017-01-18 NOTE — Interval H&P Note (Signed)
History and Physical Interval Note:  01/18/2017 10:21 AM  Andre Hoffman  has presented today for surgery, with the diagnosis of Degenerative joint disease right knee  The various methods of treatment have been discussed with the patient and family. After consideration of risks, benefits and other options for treatment, the patient has consented to  Procedure(s): RIGHT TOTAL KNEE ARTHROPLASTY WITH COMPUTER NAVIGATION (Right) as a surgical intervention .  The patient's history has been reviewed, patient examined, no change in status, stable for surgery.  I have reviewed the patient's chart and labs.  Questions were answered to the patient's satisfaction.    The risks, benefits, and alternatives were discussed with the patient. There are risks associated with the surgery including, but not limited to, problems with anesthesia (death), infection, instability (giving out of the joint), dislocation, differences in leg length/angulation/rotation, fracture of bones, loosening or failure of implants, hematoma (blood accumulation) which may require surgical drainage, blood clots, pulmonary embolism, nerve injury (foot drop and lateral thigh numbness), and blood vessel injury. The patient understands these risks and elects to proceed.    Andre Hoffman, Andre ReamsBrian Hoffman

## 2017-01-18 NOTE — H&P (View-Only) (Signed)
TOTAL KNEE ADMISSION H&P  Patient is being admitted for right total knee arthroplasty.  Subjective:  Chief Complaint:right knee pain.  HPI: Andre Hoffman, 46 y.o. male, has a history of pain and functional disability in the right knee due to arthritis and has failed non-surgical conservative treatments for greater than 12 weeks to includeNSAID's and/or analgesics, corticosteriod injections, viscosupplementation injections, flexibility and strengthening excercises, supervised PT with diminished ADL's post treatment, use of assistive devices, weight reduction as appropriate and activity modification.  Onset of symptoms was abrupt, starting 1 years ago with rapidlly worsening course since that time. The patient noted no past surgery on the right knee(s).  Patient currently rates pain in the right knee(s) at 10 out of 10 with activity. Patient has night pain, worsening of pain with activity and weight bearing, pain that interferes with activities of daily living, pain with passive range of motion, crepitus and joint swelling.  Patient has evidence of subchondral cysts, subchondral sclerosis, periarticular osteophytes and joint space narrowing by imaging studies. There is no active infection.  Patient Active Problem List   Diagnosis Date Noted  . Primary osteoarthritis of left hip 08/31/2016  . Essential hypertension, benign 08/08/2014  . Obesity, unspecified 08/08/2014  . Congestive heart failure, unspecified 08/08/2014   Past Medical History:  Diagnosis Date  . Arthritis   . CHF (congestive heart failure) (HCC)    Echo- 2014- EF 40-45%; mild LVH.Takes Lasix daily  . History of bronchitis    3 yrs ago  . History of gout    only once   . Hypertension    takes Coreg and Losartan daily  . Joint pain   . White coat hypertension     Past Surgical History:  Procedure Laterality Date  . TOTAL HIP ARTHROPLASTY Left 08/31/2016   Procedure: LEFT TOTAL HIP ARTHROPLASTY ANTERIOR APPROACH;  Surgeon:  Samson FredericBrian Maci Eickholt, MD;  Location: [facility] OR;  Service: Orthopedics;  Laterality: Left;  Requesting RNFA  . TRICEPS TENDON REPAIR Bilateral      (Not in a hospital admission) Allergies  Allergen Reactions  . Lisinopril Swelling    Angioedema  . Penicillins Swelling    As a child Has patient had a PCN reaction causing immediate rash, facial/tongue/throat swelling, SOB or lightheadedness with hypotension: Unknown Has patient had a PCN reaction causing severe rash involving mucus membranes or skin necrosis: Unknown Has patient had a PCN reaction that required hospitalization: Unknown Has patient had a PCN reaction occurring within the last 10 years: No If all of the above answers are "NO", then may proceed with Cephalosporin use.      Social History  Substance Use Topics  . Smoking status: Never Smoker  . Smokeless tobacco: Never Used  . Alcohol use No    Family History  Problem Relation Age of Onset  . Other Father     MVA  . Hypertension Paternal Grandfather   . Other Paternal Grandfather     cardiac arrest  . Diabetes Paternal Grandfather   . Heart attack Paternal Grandfather      Review of Systems  Constitutional: Negative.   HENT: Negative.   Eyes: Negative.   Respiratory: Negative.   Cardiovascular: Negative.   Gastrointestinal: Negative.   Genitourinary: Negative.   Musculoskeletal: Positive for joint pain.  Skin: Negative.   Neurological: Negative.   Endo/Heme/Allergies: Negative.   Psychiatric/Behavioral: Negative.     Objective:  Physical Exam  Vitals reviewed. Constitutional: He is oriented to person, place, and time. He appears  well-developed and well-nourished.  HENT:  Head: Normocephalic and atraumatic.  Eyes: Conjunctivae and EOM are normal. Pupils are equal, round, and reactive to light.  Neck: Normal range of motion. Neck supple.  Cardiovascular: Normal rate, regular rhythm and intact distal pulses.   Respiratory: Effort normal. No respiratory  distress.  GI: Soft. He exhibits no distension.  Genitourinary:  Genitourinary Comments: deferred  Musculoskeletal:       Right knee: He exhibits decreased range of motion, deformity and abnormal alignment. Tenderness found. Medial joint line and lateral joint line tenderness noted.  Neurological: He is alert and oriented to person, place, and time. He has normal reflexes.  Skin: Skin is warm and dry.  Psychiatric: He has a normal mood and affect. His behavior is normal. Judgment and thought content normal.    Vital signs in last 24 hours: @VSRANGES @  Labs:   Estimated body mass index is 42.91 kg/m as calculated from the following:   Height as of 01/11/17: 6\' 3"  (1.905 m).   Weight as of 01/11/17: 155.7 kg (343 lb 4.8 oz).   Imaging Review Plain radiographs demonstrate severe degenerative joint disease of the right knee(s). The overall alignment issignificant valgus. The bone quality appears to be adequate for age and reported activity level.  Assessment/Plan:  End stage arthritis, right knee   The patient history, physical examination, clinical judgment of the provider and imaging studies are consistent with end stage degenerative joint disease of the right knee(s) and total knee arthroplasty is deemed medically necessary. The treatment options including medical management, injection therapy arthroscopy and arthroplasty were discussed at length. The risks and benefits of total knee arthroplasty were presented and reviewed. The risks due to aseptic loosening, infection, stiffness, patella tracking problems, thromboembolic complications and other imponderables were discussed. The patient acknowledged the explanation, agreed to proceed with the plan and consent was signed. Patient is being admitted for inpatient treatment for surgery, pain control, PT, OT, prophylactic antibiotics, VTE prophylaxis, progressive ambulation and ADL's and discharge planning. The patient is planning to be  discharged home with home health services

## 2017-01-18 NOTE — Anesthesia Postprocedure Evaluation (Signed)
Anesthesia Post Note  Patient: Andre Hoffman  Procedure(s) Performed: Procedure(s) (LRB): RIGHT TOTAL KNEE ARTHROPLASTY WITH COMPUTER NAVIGATION (Right)  Patient location during evaluation: PACU Anesthesia Type: General Level of consciousness: awake Pain management: pain level controlled Vital Signs Assessment: post-procedure vital signs reviewed and stable Respiratory status: spontaneous breathing Cardiovascular status: stable Anesthetic complications: no       Last Vitals:  Vitals:   01/18/17 1114 01/18/17 1543  BP: (!) 154/93 118/71  Pulse: 81   Resp: (!) 22   Temp:  36.4 C    Last Pain:  Vitals:   01/18/17 1543  TempSrc:   PainSc: 8         RLE Motor Response: Purposeful movement (01/18/17 1543) RLE Sensation: Full sensation (01/18/17 1543)      Anwyn Kriegel

## 2017-01-18 NOTE — Anesthesia Postprocedure Evaluation (Signed)
Anesthesia Post Note  Patient: Andre Hoffman  Procedure(s) Performed: Procedure(s) (LRB): RIGHT TOTAL KNEE ARTHROPLASTY WITH COMPUTER NAVIGATION (Right)  Patient location during evaluation: PACU Anesthesia Type: General Level of consciousness: awake Pain management: pain level controlled Vital Signs Assessment: post-procedure vital signs reviewed and stable Respiratory status: spontaneous breathing Cardiovascular status: stable Anesthetic complications: no       Last Vitals:  Vitals:   01/18/17 2107 01/18/17 2127  BP: (!) 172/98 (!) 141/83  Pulse: 86 96  Resp: 18   Temp: 36.9 C 36.9 C    Last Pain:  Vitals:   01/18/17 2127  TempSrc: Oral  PainSc:                  [name]

## 2017-01-18 NOTE — Discharge Instructions (Signed)
° °Dr. Saara Kijowski °Total Joint Specialist °[facility] °[address] °[place], [place] [address] °[phone] ° °TOTAL KNEE REPLACEMENT POSTOPERATIVE DIRECTIONS ° ° ° °Knee Rehabilitation, Guidelines Following Surgery  °Results after knee surgery are often greatly improved when you follow the exercise, range of motion and muscle strengthening exercises prescribed by your doctor. Safety measures are also important to protect the knee from further injury. Any time any of these exercises cause you to have increased pain or swelling in your knee joint, decrease the amount until you are comfortable again and slowly increase them. If you have problems or questions, call your caregiver or physical therapist for advice.  ° °WEIGHT BEARING °Weight bearing as tolerated with assist device (walker, cane, etc) as directed, use it as long as suggested by your surgeon or therapist, typically at least 4-6 weeks. ° °HOME CARE INSTRUCTIONS  °Remove items at home which could result in a fall. This includes throw rugs or furniture in walking pathways.  °Continue medications as instructed at time of discharge. °You may have some home medications which will be placed on hold until you complete the course of blood thinner medication.  °You may start showering once you are discharged home but do not submerge the incision under water. Just pat the incision dry and apply a dry gauze dressing on daily. °Walk with walker as instructed.  °You may resume a sexual relationship in one month or when given the OK by your doctor.  °· Use walker as long as suggested by your caregivers. °· Avoid periods of inactivity such as sitting longer than an hour when not asleep. This helps prevent blood clots.  °You may put full weight on your legs and walk as much as is comfortable.  °You may return to work once you are cleared by your doctor.  °Do not drive a car for 6 weeks or until released by you surgeon.  °· Do not drive  while taking narcotics.  °Wear the elastic stockings for three weeks following surgery during the day but you may remove then at night. °Make sure you keep all of your appointments after your operation with all of your doctors and caregivers. You should call the office at the above phone number and make an appointment for approximately two weeks after the date of your surgery. °Do not remove your surgical dressing. The dressing is waterproof; you may take showers in 3 days, but do not take tub baths or submerge the dressing. °Please pick up a stool softener and laxative for home use as long as you are requiring pain medications. °· ICE to the affected knee every three hours for 30 minutes at a time and then as needed for pain and swelling.  Continue to use ice on the knee for pain and swelling from surgery. You may notice swelling that will progress down to the foot and ankle.  This is normal after surgery.  Elevate the leg when you are not up walking on it.   °It is important for you to complete the blood thinner medication as prescribed by your doctor. °· Continue to use the breathing machine which will help keep your temperature down.  It is common for your temperature to cycle up and down following surgery, especially at night when you are not up moving around and exerting yourself.  The breathing machine keeps your lungs expanded and your temperature down. ° °RANGE OF MOTION AND STRENGTHENING EXERCISES  °Rehabilitation of the knee is important following   a knee injury or an operation. After just a few days of immobilization, the muscles of the thigh which control the knee become weakened and shrink (atrophy). Knee exercises are designed to build up the tone and strength of the thigh muscles and to improve knee motion. Often times heat used for twenty to thirty minutes before working out will loosen up your tissues and help with improving the range of motion but do not use heat for the first two weeks following  surgery. These exercises can be done on a training (exercise) mat, on the floor, on a table or on a bed. Use what ever works the best and is most comfortable for you Knee exercises include:  °Leg Lifts - While your knee is still immobilized in a splint or cast, you can do straight leg raises. Lift the leg to 60 degrees, hold for 3 sec, and slowly lower the leg. Repeat 10-20 times 2-3 times daily. Perform this exercise against resistance later as your knee gets better.  °Quad and Hamstring Sets - Tighten up the muscle on the front of the thigh (Quad) and hold for 5-10 sec. Repeat this 10-20 times hourly. Hamstring sets are done by pushing the foot backward against an object and holding for 5-10 sec. Repeat as with quad sets.  °A rehabilitation program following serious knee injuries can speed recovery and prevent re-injury in the future due to weakened muscles. Contact your doctor or a physical therapist for more information on knee rehabilitation.  ° °SKILLED REHAB INSTRUCTIONS: °If the patient is transferred to a skilled rehab facility following release from the hospital, a list of the current medications will be sent to the facility for the patient to continue.  When discharged from the skilled rehab facility, please have the facility set up the patient's Home Health Physical Therapy prior to being released. Also, the skilled facility will be responsible for providing the patient with their medications at time of release from the facility to include their pain medication, the muscle relaxants, and their blood thinner medication. If the patient is still at the rehab facility at time of the two week follow up appointment, the skilled rehab facility will also need to assist the patient in arranging follow up appointment in our office and any transportation needs. ° °MAKE SURE YOU:  °Understand these instructions.  °Will watch your condition.  °Will get help right away if you are not doing well or get worse.   ° ° °Pick up stool softner and laxative for home use following surgery while on pain medications. °Do NOT remove your dressing. You may shower.  °Do not take tub baths or submerge incision under water. °May shower starting three days after surgery. °Please use a clean towel to pat the incision dry following showers. °Continue to use ice for pain and swelling after surgery. °Do not use any lotions or creams on the incision until instructed by your surgeon. ° °

## 2017-01-19 ENCOUNTER — Encounter (HOSPITAL_COMMUNITY): Payer: Self-pay | Admitting: General Practice

## 2017-01-19 LAB — CBC
HCT: 23.7 % — ABNORMAL LOW (ref 39.0–52.0)
HEMOGLOBIN: 7.6 g/dL — AB (ref 13.0–17.0)
MCH: 27.6 pg (ref 26.0–34.0)
MCHC: 32.1 g/dL (ref 30.0–36.0)
MCV: 86.2 fL (ref 78.0–100.0)
Platelets: 222 10*3/uL (ref 150–400)
RBC: 2.75 MIL/uL — ABNORMAL LOW (ref 4.22–5.81)
RDW: 16.8 % — ABNORMAL HIGH (ref 11.5–15.5)
WBC: 9.9 10*3/uL (ref 4.0–10.5)

## 2017-01-19 LAB — BASIC METABOLIC PANEL
Anion gap: 7 (ref 5–15)
BUN: 25 mg/dL — AB (ref 6–20)
CO2: 26 mmol/L (ref 22–32)
CREATININE: 1.82 mg/dL — AB (ref 0.61–1.24)
Calcium: 8.4 mg/dL — ABNORMAL LOW (ref 8.9–10.3)
Chloride: 106 mmol/L (ref 101–111)
GFR calc Af Amer: 50 mL/min — ABNORMAL LOW (ref >60)
GFR, EST NON AFRICAN AMERICAN: 43 mL/min — AB (ref >60)
Glucose, Bld: 154 mg/dL — ABNORMAL HIGH (ref 65–99)
POTASSIUM: 4.7 mmol/L (ref 3.5–5.1)
SODIUM: 139 mmol/L (ref 135–145)

## 2017-01-19 MED ORDER — INFLUENZA VAC SPLIT QUAD 0.5 ML IM SUSY
0.5000 mL | PREFILLED_SYRINGE | INTRAMUSCULAR | Status: AC
  Administered 2017-01-20: 0.5 mL via INTRAMUSCULAR
  Filled 2017-01-19: qty 0.5

## 2017-01-19 MED ORDER — OXYCODONE HCL ER 10 MG PO T12A
10.0000 mg | EXTENDED_RELEASE_TABLET | Freq: Two times a day (BID) | ORAL | Status: DC
  Administered 2017-01-19 – 2017-01-24 (×11): 10 mg via ORAL
  Filled 2017-01-19 (×11): qty 1

## 2017-01-19 NOTE — Progress Notes (Signed)
Physical Therapy Treatment Patient Details Name: Andre Hoffman MRN: [id number] DOB: [birth year] Today's Date: 01/19/2017    History of Present Illness 46 yo male no s/p right TKA. PMH: HTN, Obesity, CHF, L direct antrior THA (~4 months ago).    PT Comments    Improved ability to transfer and improved ability to ambulate this afternoon. The patient continues to require (+) 2 assistance. Depending on his progress he may require SNF at discharge. He was able to transfer to a chair and ambulate 6 feet.   Follow Up Recommendations  Home health PT;SNF     Equipment Recommendations       Recommendations for Other Services Rehab consult     Precautions / Restrictions Precautions Precautions: Fall;Knee Precaution Booklet Issued: Yes (comment) Precaution Comments: Reviewed knee inflrmation pakcet  Restrictions Weight Bearing Restrictions: No RLE Weight Bearing: Weight bearing as tolerated    Mobility  Bed Mobility Overal bed mobility: Needs Assistance Bed Mobility: Supine to Sit     Supine to sit: Min assist     General bed mobility comments: Improved ability to get to the edge of the bed   Transfers Overall transfer level: Needs assistance Equipment used: Rolling walker (2 wheeled) Transfers: Sit to/from Stand Sit to Stand: +2 physical assistance;Mod assist;Max assist         General transfer comment: Max a on the first trial but the patient was uunable to stand. On the second trial the patient tolerated transfer better and was able to stand. He required mod cuing to stand. On ce standing m,in a to remain standing.   Ambulation/Gait Ambulation/Gait assistance: Min guard Ambulation Distance (Feet): 6 Feet Assistive device: Rolling walker (2 wheeled)     Gait velocity interpretation: Below normal speed for age/gender General Gait Details: Chair follow. min guard for balance. Min cuing for weight shift. Increased pain with weight bearing but patient able to put weight  through the right lower extrmeity.    Stairs            Wheelchair Mobility    Modified Rankin (Stroke Patients Only)       Balance Overall balance assessment: Needs assistance Sitting-balance support: No upper extremity supported;Feet supported Sitting balance-Leahy Scale: Good         Standing balance comment: did not occur                    Cognition Arousal/Alertness: Awake/alert Behavior During Therapy: WFL for tasks assessed/performed Overall Cognitive Status: Within Functional Limits for tasks assessed                      Exercises Total Joint Exercises Ankle Circles/Pumps: 20 reps Quad Sets: 10 reps Heel Slides: 20 reps (reviewed the use of as sheet for ROM ) Goniometric ROM: 4-83 with pain at end ranges     General Comments        Pertinent Vitals/Pain Pain Assessment: 0-10 Pain Score: 8  Pain Location: right knee Pain Descriptors / Indicators: Aching;Grimacing;Guarding;Sore Pain Intervention(s): Limited activity within patient's tolerance;Premedicated before session;Monitored during session;Repositioned;Relaxation;Ice applied    Home Living Family/patient expects to be discharged to:: Private residence Living Arrangements: Spouse/significant other Available Help at Discharge: Family;Available PRN/intermittently Type of Home: House Home Access: Stairs to enter Entrance Stairs-Rails: None Home Layout: Two level;Bed/bath upstairs Home Equipment: Bedside commode;Walker - 2 wheels;Crutches;Tub bench      Prior Function Level of Independence: Independent          PT  Goals (current goals can now be found in the care plan section) Acute Rehab PT Goals Patient Stated Goal: decrease pain PT Goal Formulation: With patient Time For Goal Achievement: 02/02/17 Potential to Achieve Goals: Good    Frequency    7X/week      PT Plan      Co-evaluation             End of Session Equipment Utilized During Treatment:  Gait belt Activity Tolerance: Patient limited by pain Patient left: in bed;with call bell/phone within reach;with nursing/sitter in room     Time: 1455-1515 PT Time Calculation (min) (ACUTE ONLY): 20 min  Charges:                       G Codes:      Dessie Comaavid J Yen Wandell PT DPT  01/19/2017, 4:08 PM

## 2017-01-19 NOTE — Evaluation (Signed)
Occupational Therapy Evaluation Patient Details Name: Andre Hoffman MRN: [id number] DOB: [birth year] Today's Date: 01/19/2017    History of Present Illness 46 yo male no s/p right TKA. PMH: HTN, Obesity, CHF, L direct antrior THA (~4 months ago).   Clinical Impression   Patient presenting with decreased ADL and functional mobility independence and safety. Patient independent to mod I PTA. Patient currently functioning at an overall min assist for seated level UB ADLs and total assist for bed level LB ADLs. Patient will benefit from acute OT to increase overall independence in the areas of ADLs, functional mobility, and overall safety in order to safely discharge home vs SNF, depending on patient's progress.     Follow Up Recommendations  Supervision/Assistance - 24 hour;Home health OT;SNF (SNF depending on progress )    Equipment Recommendations  None recommended by OT    Recommendations for Other Services  None at this time   Precautions / Restrictions Precautions Precautions: Fall;Knee Precaution Comments: Reviewed no pillow under knee  Restrictions Weight Bearing Restrictions: Yes RLE Weight Bearing: Weight bearing as tolerated    Mobility Bed Mobility Overal bed mobility: Needs Assistance Bed Mobility: Supine to Sit     Supine to sit: Min assist     General bed mobility comments: Pt needed assistance with management of RLE. Bed chuck used to pull patient's hips around and forward to get feet onto ground. Cues for sequencing and technique.   Transfers General transfer comment: Did not occur, pt unable due to pain in right knee.     Balance Overall balance assessment: Needs assistance Sitting-balance support: No upper extremity supported;Feet supported Sitting balance-Leahy Scale: Good Standing balance comment: did not occur     ADL Overall ADL's : Needs assistance/impaired Eating/Feeding: Set up;Sitting   Grooming: Set up;Sitting   Upper Body Bathing:  Minimal assistance;Sitting   Lower Body Bathing: Total assistance;Bed level   Upper Body Dressing : Minimal assistance;Sitting   Lower Body Dressing: Total assistance;Bed level     Toilet Transfer Details (indicate cue type and reason): Did not occur, pt unable to stand during eval due to increased pain and weakness Toileting- Clothing Manipulation and Hygiene: Total assistance;Bed level     Tub/Shower Transfer Details (indicate cue type and reason): Did not occur   General ADL Comments: Pt found supine in bed. Pt overall total assist for LB ADLs from bed level and min assist for UB ADLs seated EOB. Pt unable to stand during eval. Pt left seated EOB as he stated he felt better. Seated EOB, pt's BP=120/74.     Pertinent Vitals/Pain Pain Assessment: 0-10 Pain Score: 8  Pain Location: right knee Pain Descriptors / Indicators: Aching;Grimacing;Guarding;Sore Pain Intervention(s): Limited activity within patient's tolerance;Monitored during session;Premedicated before session    Hand Dominance Left   Extremity/Trunk Assessment Upper Extremity Assessment Upper Extremity Assessment: Overall WFL for tasks assessed   Lower Extremity Assessment Lower Extremity Assessment: Defer to PT evaluation   Cervical / Trunk Assessment Cervical / Trunk Assessment: Normal   Communication Communication Communication: No difficulties   Cognition Arousal/Alertness: Awake/alert Behavior During Therapy: WFL for tasks assessed/performed Overall Cognitive Status: Within Functional Limits for tasks assessed              Home Living Family/patient expects to be discharged to:: Private residence Living Arrangements: Spouse/significant other Available Help at Discharge: Family;Available PRN/intermittently (wife works) Type of Home: House Home Access: Stairs to enter Entergy CorporationEntrance Stairs-Number of Steps: 5 Entrance Stairs-Rails: None Home Layout: Two level;Bed/bath  upstairs (plan is for pt to live  downstairs) Alternate Level Stairs-Number of Steps: flight with landing Alternate Level Stairs-Rails:  (1st flight left and 2nd flight right) Bathroom Shower/Tub: Tub/shower unit;Curtain   Bathroom Toilet: Standard     Home Equipment: Bedside commode;Walker - 2 wheels;Crutches;Tub bench   Prior Functioning/Environment Level of Independence: Independent        OT Problem List: Decreased strength;Decreased range of motion;Decreased activity tolerance;Impaired balance (sitting and/or standing);Decreased coordination;Decreased safety awareness;Decreased knowledge of use of DME or AE;Decreased knowledge of precautions;Pain   OT Treatment/Interventions: Self-care/ADL training;Therapeutic exercise;Energy conservation;DME and/or AE instruction;Therapeutic activities;Patient/family education;Balance training    OT Goals(Current goals can be found in the care plan section) Acute Rehab OT Goals Patient Stated Goal: decrease pain OT Goal Formulation: With patient/family Time For Goal Achievement: 02/02/17 Potential to Achieve Goals: Good ADL Goals Pt Will Perform Grooming: with supervision;standing Pt Will Perform Lower Body Bathing: with min assist;sit to/from stand Pt Will Perform Lower Body Dressing: with min assist;sit to/from stand Pt Will Transfer to Toilet: with supervision;bedside commode;ambulating Pt Will Perform Tub/Shower Transfer: Tub transfer;tub bench;rolling walker;ambulating;with supervision  OT Frequency: Min 2X/week   Barriers to D/C: Inaccessible home environment;Decreased caregiver support  5 STE house, wife works full time   End of Session Nurse Communication: Other (comment) (notified nurse of patient's location (EOB) and pt's BP readings seated EOB)  Activity Tolerance: Patient limited by pain Patient left: in bed;with call bell/phone within reach (seated EOB)   Time: 1106-1130 OT Time Calculation (min): 24 min Charges:  OT General Charges $OT Visit: 1  Procedure OT Evaluation $OT Eval Moderate Complexity: 1 Procedure OT Treatments $Self Care/Home Management : 8-22 mins  Edwin Cap , MS, OTR/L, CLT Pager: [phone] 01/19/2017, 11:46 AM

## 2017-01-19 NOTE — Progress Notes (Signed)
Physical Therapy Evaluation Patient Details Name: Andre Hoffman MRN: [id number] DOB: March 27, 1971 Today's Date: 01/19/2017   History of Present Illness  46 yo male no s/p right TKA. PMH: HTN, Obesity, CHF, L direct antrior THA (~4 months ago).  Clinical Impression  Patient is very limited at this time by pain. He was unable to stand and unable to transfer to a chair. He has 5 steps to get into his house. He also has stairs to get to his bedroom inside but he can use the first floor. At this time he may benefit from rehab at a SNF unless he improves his ability to stand and walk. 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 Acute therapy will continue to work with the patient while he is in the hospital to improve mobility and strength.     Follow Up Recommendations Home health PT;SNF (SNF at this timeunless )     Equipment Recommendations       Recommendations for Other Services Rehab consult     Precautions / Restrictions Precautions Precautions: Fall;Knee Precaution Booklet Issued: Yes (comment) Precaution Comments: Reviewed knee inflrmation pakcet  Restrictions Weight Bearing Restrictions: No RLE Weight Bearing: Weight bearing as tolerated      Mobility  Bed Mobility Overal bed mobility: Needs Assistance Bed Mobility: Supine to Sit     Supine to sit: Mod assist     General bed mobility comments: Patient needed assistance to sit up at the edge of the bed. Once sitting supervision while sitting at the edge  Transfers Overall transfer level: Needs assistance Equipment used: Rolling walker (2 wheeled) Transfers: Sit to/from Stand Sit to Stand: +2 physical assistance;Total assist         General transfer comment: (+) 2 not available. 1 therapist unable to raise patient up off the bed despite total assist. Crepitus noted in his shouldder with transfer on the right side. Therapy will return with (+) 2 assistance in the afternoon   Ambulation/Gait            Gait velocity interpretation: Below normal speed for age/gender General Gait Details: Patient unable to stand  Stairs            Wheelchair Mobility    Modified Rankin (Stroke Patients Only)       Balance Overall balance assessment: Needs assistance Sitting-balance support: No upper extremity supported;Feet supported Sitting balance-Leahy Scale: Good         Standing balance comment: did not occur                             Pertinent Vitals/Pain Pain Assessment: 0-10 Pain Score: 8  Pain Location: right knee Pain Descriptors / Indicators: Aching;Grimacing;Guarding;Sore Pain Intervention(s): Limited activity within patient's tolerance;Premedicated before session;Monitored during session;Repositioned;Relaxation;Ice applied    Home Living Family/patient expects to be discharged to:: Private residence Living Arrangements: Spouse/significant other Available Help at Discharge: Family;Available PRN/intermittently Type of Home: House Home Access: Stairs to enter Entrance Stairs-Rails: None Entrance Stairs-Number of Steps: 5 Home Layout: Two level;Bed/bath upstairs Home Equipment: Bedside commode;Walker - 2 wheels;Crutches;Tub bench      Prior Function Level of Independence: Independent               Hand Dominance   Dominant Hand: Left    Extremity/Trunk Assessment   Upper Extremity Assessment Upper Extremity Assessment: Defer to OT evaluation    Lower Extremity Assessment Lower Extremity Assessment: RLE  deficits/detail;LLE deficits/detail RLE Deficits / Details: limited quad contraction  RLE: Unable to fully assess due to pain LLE Deficits / Details: Appearas to have some weakness in the left as well. Patient unable to stand using his left leg. had a left THA in 9/17, 4+/5 hip flexion 5/5 knee extension/flexion     Cervical / Trunk Assessment Cervical / Trunk Assessment: Normal  Communication   Communication: No difficulties   Cognition Arousal/Alertness: Awake/alert Behavior During Therapy: WFL for tasks assessed/performed Overall Cognitive Status: Within Functional Limits for tasks assessed                      General Comments      Exercises Total Joint Exercises Ankle Circles/Pumps: 20 reps Quad Sets: 10 reps Heel Slides: 20 reps   Assessment/Plan    PT Assessment Patient needs continued PT services  PT Problem List Decreased strength;Decreased range of motion;Decreased activity tolerance;Decreased balance;Decreased mobility;Decreased coordination;Decreased cognition;Decreased safety awareness;Decreased knowledge of precautions;Obesity;Pain          PT Treatment Interventions DME instruction;Gait training;Stair training;Functional mobility training;Therapeutic activities;Therapeutic exercise;Balance training;Neuromuscular re-education;Manual techniques;Modalities    PT Goals (Current goals can be found in the Care Plan section)  Acute Rehab PT Goals Patient Stated Goal: decrease pain PT Goal Formulation: With patient Time For Goal Achievement: 02/02/17 Potential to Achieve Goals: Good    Frequency 7X/week   Barriers to discharge        Co-evaluation               End of Session Equipment Utilized During Treatment: Gait belt Activity Tolerance: Patient limited by pain Patient left: in bed;with call bell/phone within reach;with nursing/sitter in room           Time: 1610-9604 PT Time Calculation (min) (ACUTE ONLY): 59 min   Charges:   PT Evaluation $PT Eval Low Complexity: 1 Procedure     PT G Codes:        Dessie Coma PT DPT  01/19/2017, 2:10 PM

## 2017-01-19 NOTE — Progress Notes (Signed)
   Subjective:  Patient reports pain as mild to moderate.  Denies N/V/CP/SOB. Pain control issues o/n.  Objective:   VITALS:   Vitals:   01/18/17 2107 01/18/17 2127 01/19/17 0145 01/19/17 0500  BP: (!) 172/98 (!) 141/83 (!) 133/55 (!) 94/54  Pulse: 86 96 90 86  Resp: 18  18 17   Temp: 98.4 F (36.9 C) 98.4 F (36.9 C) 98.5 F (36.9 C)   TempSrc: Oral Oral Oral   SpO2: 97% 93% 99% 98%  Weight:    (!) 160.1 kg (352 lb 15.3 oz)  Height:        NAD ABD soft Sensation intact distally Intact pulses distally Dorsiflexion/Plantar flexion intact Incision: dressing C/D/I Compartment soft HV ss   Lab Results  Component Value Date   WBC 9.9 01/19/2017   HGB 7.6 (L) 01/19/2017   HCT 23.7 (L) 01/19/2017   MCV 86.2 01/19/2017   PLT 222 01/19/2017   BMET    Component Value Date/Time   NA 139 01/19/2017 0423   K 4.7 01/19/2017 0423   CL 106 01/19/2017 0423   CO2 26 01/19/2017 0423   GLUCOSE 154 (H) 01/19/2017 0423   BUN 25 (H) 01/19/2017 0423   CREATININE 1.82 (H) 01/19/2017 0423   CALCIUM 8.4 (L) 01/19/2017 0423   GFRNONAA 43 (L) 01/19/2017 0423   GFRAA 50 (L) 01/19/2017 0423     Assessment/Plan: 1 Day Post-Op   Principal Problem:   Osteoarthritis of right knee   WBAT with walker Hypotension: secondary to ABLA, cont IVFs Stage II CKD: cont gentle IVFs ABLA: monitor, transfuse if hgb drops below 7 or if remains hypotensive DVT ppx: ASA, SCDs, TEDs PT/OT Dispo: recheck labs in am   Garnet KoyanagiSwinteck, Carla Whilden Keelin 01/19/2017, 7:48 AM   Samson FredericBrian Ardith Test, MD Cell [phone]

## 2017-01-20 LAB — CBC
HCT: 22.3 % — ABNORMAL LOW (ref 39.0–52.0)
Hemoglobin: 7.2 g/dL — ABNORMAL LOW (ref 13.0–17.0)
MCH: 27.9 pg (ref 26.0–34.0)
MCHC: 32.3 g/dL (ref 30.0–36.0)
MCV: 86.4 fL (ref 78.0–100.0)
PLATELETS: 230 10*3/uL (ref 150–400)
RBC: 2.58 MIL/uL — ABNORMAL LOW (ref 4.22–5.81)
RDW: 16.8 % — AB (ref 11.5–15.5)
WBC: 12.3 10*3/uL — ABNORMAL HIGH (ref 4.0–10.5)

## 2017-01-20 MED ORDER — ONDANSETRON HCL 4 MG PO TABS: 4.0000 mg | ORAL_TABLET | Freq: Four times a day (QID) | ORAL | 0 refills | Status: DC | PRN

## 2017-01-20 MED ORDER — HYDROCODONE-ACETAMINOPHEN 5-325 MG PO TABS: 1.0000 | ORAL_TABLET | ORAL | 0 refills | Status: DC | PRN

## 2017-01-20 MED ORDER — DOCUSATE SODIUM 100 MG PO CAPS: 100.0000 mg | ORAL_CAPSULE | Freq: Two times a day (BID) | ORAL | 0 refills | Status: AC

## 2017-01-20 MED ORDER — OXYCODONE HCL ER 10 MG PO T12A: 10.0000 mg | EXTENDED_RELEASE_TABLET | ORAL | 0 refills | Status: DC

## 2017-01-20 MED ORDER — SENNA 8.6 MG PO TABS: 2.0000 | ORAL_TABLET | Freq: Every day | ORAL | 0 refills | Status: DC

## 2017-01-20 MED ORDER — ASPIRIN 81 MG PO CHEW: 81.0000 mg | CHEWABLE_TABLET | Freq: Two times a day (BID) | ORAL | 1 refills | Status: DC

## 2017-01-20 MED ORDER — METHOCARBAMOL 500 MG PO TABS: 500.0000 mg | ORAL_TABLET | Freq: Four times a day (QID) | ORAL | 0 refills | Status: DC | PRN

## 2017-01-20 NOTE — Progress Notes (Signed)
   Subjective:  Patient reports pain as mild to moderate.  Denies N/V/CP/SOB. Slow progress with PT  Objective:   VITALS:   Vitals:   01/19/17 0500 01/19/17 1748 01/19/17 2103 01/20/17 0415  BP: (!) 94/54 (!) 158/81 (!) 122/57 128/71  Pulse: 86 (!) 102 93 89  Resp: 17  17 17   Temp:  99.9 F (37.7 C) 99 F (37.2 C) 99 F (37.2 C)  TempSrc:  Oral Oral Oral  SpO2: 98% 95% 100% 100%  Weight: (!) 160.1 kg (352 lb 15.3 oz)     Height:        NAD ABD soft Sensation intact distally Intact pulses distally Dorsiflexion/Plantar flexion intact Incision: dressing C/D/I Compartment soft    Lab Results  Component Value Date   WBC 12.3 (H) 01/20/2017   HGB 7.2 (L) 01/20/2017   HCT 22.3 (L) 01/20/2017   MCV 86.4 01/20/2017   PLT 230 01/20/2017   BMET    Component Value Date/Time   NA 139 01/19/2017 0423   K 4.7 01/19/2017 0423   CL 106 01/19/2017 0423   CO2 26 01/19/2017 0423   GLUCOSE 154 (H) 01/19/2017 0423   BUN 25 (H) 01/19/2017 0423   CREATININE 1.82 (H) 01/19/2017 0423   CALCIUM 8.4 (L) 01/19/2017 0423   GFRNONAA 43 (L) 01/19/2017 0423   GFRAA 50 (L) 01/19/2017 0423     Assessment/Plan: 2 Days Post-Op   Principal Problem:   Osteoarthritis of right knee   WBAT with walker Hypotension: secondary to ABLA, cont IVFs Stage II CKD: cont gentle IVFs ABLA: monitor, transfuse if hgb drops below 7 or if remains hypotensive DVT ppx: ASA, SCDs, TEDs PT/OT Dispo: recheck labs in am, plan for D/C home tomorrow   Garnet KoyanagiSwinteck, Aziah Kaiser Zohaib 01/20/2017, 7:47 AM   Samson FredericBrian Latrece Nitta, MD Cell [phone]

## 2017-01-20 NOTE — Discharge Summary (Signed)
Physician Discharge Summary  Patient ID: Andre Hoffman MRN: [id number] DOB/AGE: [birth year] 46 y.o.  Admit date: 01/18/2017 Discharge date: 01/24/2017  Admission Diagnoses:  Osteoarthritis of right knee  Discharge Diagnoses:  Principal Problem:   Osteoarthritis of right knee Active Problems:   Essential hypertension, benign   Congestive heart failure (HCC)   Acute blood loss anemia   Acute on chronic renal failure [name])   Past Medical History:  Diagnosis Date  . Arthritis    OSTEO  . CHF (congestive heart failure) (HCC)    Echo- 2014- EF 40-45%; mild LVH.Takes Lasix daily  . History of bronchitis    3 yrs ago  . History of gout    only once   . Hypertension    takes Coreg and Losartan daily  . Joint pain   . White coat hypertension     Surgeries: Procedure(s): RIGHT TOTAL KNEE ARTHROPLASTY WITH COMPUTER NAVIGATION on 01/18/2017   Consultants (if any):   Discharged Condition: Improved  Hospital Course: Andre Hoffman is an 46 y.o. male who was admitted 01/18/2017 with a diagnosis of Osteoarthritis of right knee and went to the operating room on 01/18/2017 and underwent the above named procedures.    He was given perioperative antibiotics:  Anti-infectives    Start     Dose/Rate Route Frequency Ordered Stop   01/18/17 2300  vancomycin (VANCOCIN) IVPB 1000 mg/200 mL premix     1,000 mg 200 mL/hr over 60 Minutes Intravenous Every 12 hours 01/18/17 2057 01/18/17 2256   01/18/17 0930  vancomycin (VANCOCIN) 1,500 mg in sodium chloride 0.9 % 500 mL IVPB     1,500 mg 250 mL/hr over 120 Minutes Intravenous To [facility] 01/15/17 1440 01/18/17 1217    .  He was given sequential compression devices, early ambulation, and ASA for DVT prophylaxis.  He received packed RBCs for blood loss anemia. He developed a rise in creatinine, was was manage by the hospitalist.   He benefited maximally from the hospital stay and there were no complications.    Recent vital signs:   Vitals:   01/24/17 0052 01/24/17 0530  BP: (!) 157/84 137/69  Pulse: 78 83  Resp: 18 18  Temp: 98.4 F (36.9 C) 98.7 F (37.1 C)    Recent laboratory studies:  Lab Results  Component Value Date   HGB 8.2 (L) 01/24/2017   HGB 7.9 (L) 01/23/2017   HGB 7.7 (L) 01/23/2017   Lab Results  Component Value Date   WBC 8.9 01/24/2017   PLT 388 01/24/2017   No results found for: INR Lab Results  Component Value Date   NA 140 01/24/2017   K 4.5 01/24/2017   CL 106 01/24/2017   CO2 27 01/24/2017   BUN 18 01/24/2017   CREATININE 1.06 01/24/2017   GLUCOSE 124 (H) 01/24/2017    Discharge Medications:   Allergies as of 01/24/2017      Reactions   Lisinopril Swelling   Angioedema   Penicillins Swelling   SWELLING REACTION UNSPECIFIED as a child Has patient had a PCN reaction causing immediate rash, facial/tongue/throat swelling, SOB or lightheadedness with hypotension: Unknown Has patient had a PCN reaction causing severe rash involving mucus membranes or skin necrosis: Unknown Has patient had a PCN reaction that required hospitalization: Unknown Has patient had a PCN reaction occurring within the last 10 years: No If all of the above answers are "NO", then may proceed with Cephalosporin use.      Medication List  TAKE these medications   amLODipine 5 MG tablet Commonly known as:  NORVASC Take 5 mg by mouth daily.   aspirin 81 MG chewable tablet Chew 1 tablet (81 mg total) by mouth 2 (two) times daily.   carvedilol 25 MG tablet Commonly known as:  COREG Take 25 mg by mouth 2 (two) times daily.   docusate sodium 100 MG capsule Commonly known as:  COLACE Take 1 capsule (100 mg total) by mouth 2 (two) times daily.   furosemide 40 MG tablet Commonly known as:  LASIX Take 20 mg by mouth daily.   HYDROcodone-acetaminophen 5-325 MG tablet Commonly known as:  NORCO/VICODIN Take 1-2 tablets by mouth every 4 (four) hours as needed (breakthrough pain).   losartan 100 MG  tablet Commonly known as:  COZAAR Take 100 mg by mouth daily.   methocarbamol 500 MG tablet Commonly known as:  ROBAXIN Take 1 tablet (500 mg total) by mouth every 6 (six) hours as needed for muscle spasms.   ondansetron 4 MG tablet Commonly known as:  ZOFRAN Take 1 tablet (4 mg total) by mouth every 6 (six) hours as needed for nausea.   oxyCODONE 10 mg 12 hr tablet Commonly known as:  OXYCONTIN Take 1 tablet (10 mg total) by mouth PRO. 1 tab PO every 12 hours for 3 days, then 1 tab PO daily for 4 days   potassium chloride 10 MEQ tablet Commonly known as:  K-DUR,KLOR-CON Take 10 mEq by mouth daily.   senna 8.6 MG Tabs tablet Commonly known as:  SENOKOT Take 2 tablets (17.2 mg total) by mouth at bedtime.       Diagnostic Studies: [place] Renal  Result Date: 01/21/2017 CLINICAL DATA:  Acute renal insufficiency. EXAM: RENAL / URINARY TRACT ULTRASOUND COMPLETE COMPARISON:  None. FINDINGS: Right Kidney: Length: 11.1 cm. Echogenicity within normal limits. No mass or hydronephrosis visualized. Left Kidney: Length: 11.8 cm. Echogenicity within normal limits. No mass or hydronephrosis visualized. Bladder: Appears normal for degree of bladder distention. Bilateral ureteral jets seen. IMPRESSION: Normal renal ultrasound. Electronically Signed   By: Ted Mcalpineobrinka  Dimitrova M.D.   On: 01/21/2017 15:53   Dg Knee Right Port  Result Date: 01/18/2017 CLINICAL DATA:  Status post right total knee replacement today. EXAM: PORTABLE RIGHT KNEE - 1-2 VIEW COMPARISON:  None. FINDINGS: The patient is status post right total knee arthroplasty. Surgical drain and staples are noted. No fracture or dislocation. Hardware is intact. IMPRESSION: Status post right total knee arthroplasty.  No acute finding. Electronically Signed   By: Drusilla Kannerhomas  Dalessio M.D.   On: 01/18/2017 16:05    Disposition: 01-Home or Self Care  Discharge Instructions    Call MD / Call 911    Complete by:  As directed    If you experience chest  pain or shortness of breath, CALL 911 and be transported to the hospital emergency room.  If you develope a fever above 101 F, pus (white drainage) or increased drainage or redness at the wound, or calf pain, call your surgeon's office.   Call MD / Call 911    Complete by:  As directed    If you experience chest pain or shortness of breath, CALL 911 and be transported to the hospital emergency room.  If you develope a fever above 101 F, pus (white drainage) or increased drainage or redness at the wound, or calf pain, call your surgeon's office.   Constipation Prevention    Complete by:  As directed    Drink  plenty of fluids.  Prune juice may be helpful.  You may use a stool softener, such as Colace (over the counter) 100 mg twice a day.  Use MiraLax (over the counter) for constipation as needed.   Constipation Prevention    Complete by:  As directed    Drink plenty of fluids.  Prune juice may be helpful.  You may use a stool softener, such as Colace (over the counter) 100 mg twice a day.  Use MiraLax (over the counter) for constipation as needed.   Diet - low sodium heart healthy    Complete by:  As directed    Diet - low sodium heart healthy    Complete by:  As directed    Do not put a pillow under the knee. Place it under the heel.    Complete by:  As directed    Driving restrictions    Complete by:  As directed    No driving for 6 weeks   Increase activity slowly as tolerated    Complete by:  As directed    Increase activity slowly as tolerated    Complete by:  As directed    Lifting restrictions    Complete by:  As directed    No lifting for 6 weeks   TED hose    Complete by:  As directed    Use stockings (TED hose) for 2 weeks on both leg(s).  You may remove them at night for sleeping.      Follow-up Information    Alese Furniss, Cloyde Reams, MD. Schedule an appointment as soon as possible for a visit in 2 week(s).   Specialty:  Orthopedic Surgery Why:  For wound re-check Contact  information: [address] [place] [address] [phone]        [facility] Follow up.   Specialty:  Home Health Services Why:  Someone from [facility] will contact you to arrange start date and time for therapy. Contact information: [address] [place] [address] [place] [address] [phone]            Signed: Channon, Ambrosini 01/29/2017, 3:26 PM

## 2017-01-20 NOTE — Progress Notes (Signed)
Physical Therapy Treatment Patient Details Name: Andre Hoffman MRN: [id number] DOB: 1971/05/29 Today's Date: 01/20/2017    History of Present Illness 46 yo male no s/p right TKA. PMH: HTN, Obesity, CHF, L direct antrior THA (~4 months ago).    PT Comments    Patient continues to make progress toward mobility goals and is requiring less assistance for transfers compared to previous sessions. Continue to progress as tolerated with anticipated d/c home with HHPT.   Follow Up Recommendations  Home health PT;Supervision/Assistance - 24 hour     Equipment Recommendations  Rolling walker with 5" wheels;3in1 (PT);Other (comment) (bariatric)    Recommendations for Other Services Rehab consult     Precautions / Restrictions Precautions Precautions: Fall;Knee Precaution Comments: reviewed precautions/positioning Restrictions Weight Bearing Restrictions: Yes RLE Weight Bearing: Weight bearing as tolerated    Mobility  Bed Mobility               General bed mobility comments: pt OOB in chair upon arrival  Transfers Overall transfer level: Needs assistance Equipment used: Rolling walker (2 wheeled) Transfers: Sit to/from Stand Sit to Stand: Min guard         General transfer comment: min guard for safety; increased time and effort to stand however no physical assist required; cues for technique  Ambulation/Gait Ambulation/Gait assistance: Min guard Ambulation Distance (Feet): 10 Feet Assistive device: Rolling walker (2 wheeled) Gait Pattern/deviations: Decreased weight shift to right;Trunk flexed;Step-through pattern;Decreased stride length Gait velocity: decreased   General Gait Details: cues for posture   Stairs Stairs: Yes   Stair Management: No rails;Backwards;Step to pattern;With walker Number of Stairs: 5 General stair comments: pt with carry over of sequencing/technique from recent THA; cues for RW placement and safety; assist to ascend and to stabilize  RW; pt c/o bilat UE fatigue and required sitting break on steps; pt felt too fatigued to attempt descend steps with RW and educated in bumping down steps in sitting; max +2 to stand from sitting on steps  Wheelchair Mobility    Modified Rankin (Stroke Patients Only)       Balance Overall balance assessment: Needs assistance Sitting-balance support: No upper extremity supported;Feet supported Sitting balance-Leahy Scale: Good     Standing balance support: Bilateral upper extremity supported;During functional activity Standing balance-Leahy Scale: Poor Standing balance comment: reliant on RW                    Cognition Arousal/Alertness: Awake/alert Behavior During Therapy: WFL for tasks assessed/performed Overall Cognitive Status: Within Functional Limits for tasks assessed                      Exercises Total Joint Exercises Quad Sets: AROM;Right;10 reps Heel Slides: AAROM;Right;10 reps Goniometric ROM: approx 5-85    General Comments        Pertinent Vitals/Pain Pain Assessment: 0-10 Pain Score: 5  Pain Location: right knee Pain Descriptors / Indicators: Aching;Guarding;Sore;Tightness Pain Intervention(s): Limited activity within patient's tolerance;Monitored during session;Premedicated before session;Repositioned    Home Living                      Prior Function            PT Goals (current goals can now be found in the care plan section) Acute Rehab PT Goals Patient Stated Goal: get better PT Goal Formulation: With patient Time For Goal Achievement: 02/02/17 Potential to Achieve Goals: Good Progress towards PT goals: Progressing toward goals  Frequency    7X/week      PT Plan Current plan remains appropriate    Co-evaluation             End of Session Equipment Utilized During Treatment: Gait belt Activity Tolerance: Patient limited by fatigue Patient left: with call bell/phone within reach;in chair      Time: 9147-8295 PT Time Calculation (min) (ACUTE ONLY): 49 min  Charges:  $Gait Training: 23-37 mins $Therapeutic Activity: 8-22 mins                    G Codes:      Derek Mound, PTA Pager: [phone]   01/20/2017, 4:34 PM

## 2017-01-20 NOTE — Progress Notes (Signed)
Occupational Therapy Treatment Patient Details Name: Andre Hoffman MRN: [id number] DOB: 12-14-1971 Today's Date: 01/20/2017    History of present illness 46 yo male no s/p right TKA. PMH: HTN, Obesity, CHF, L direct antrior THA (~4 months ago).   OT comments  Pt making good progress towards OT goals, continue plan of care for now. Pt takes increased time and effort to perform sit to/from stands and ADL type tasks. See below under ADL for more information regarding this skilled intervention session.   Follow Up Recommendations  No OT follow up;Supervision/Assistance - 24 hour    Equipment Recommendations  None recommended by OT    Recommendations for Other Services  None at this time   Precautions / Restrictions Precautions Precautions: Fall;Knee Precaution Comments: reviewed precautions/positioning Restrictions Weight Bearing Restrictions: Yes RLE Weight Bearing: Weight bearing as tolerated     Mobility Bed Mobility Overal bed mobility: Needs Assistance Bed Mobility: Supine to Sit     Supine to sit: Supervision     General bed mobility comments: supervision for safety; HOB flat and no use of rails; cues for technique  Transfers Overall transfer level: Needs assistance Equipment used: Rolling walker (2 wheeled) Transfers: Sit to/from Stand Sit to Stand: Min assist         General transfer comment: Pt required up to min assist for sit to/from stands from recliner. Pt required cueing for hand placement, sequencing, BLE placement, and forward leaning. Pt took increased time and effort to transition from sit to/from stands.     Balance Overall balance assessment: Needs assistance Sitting-balance support: No upper extremity supported;Feet supported Sitting balance-Leahy Scale: Good     Standing balance support: Bilateral upper extremity supported;During functional activity Standing balance-Leahy Scale: Fair Standing balance comment: reliant on RW   ADL Overall  ADL's : Needs assistance/impaired Eating/Feeding: Set up;Sitting   Grooming: Set up;Sitting   Upper Body Bathing: Set up;Sitting   Lower Body Bathing: Maximal assistance;Sit to/from stand   Upper Body Dressing : Set up;Sitting   Lower Body Dressing: Maximal assistance;Sit to/from Market researcherstand     Toilet Transfer Details (indicate cue type and reason): Did not occur this session, anticipate pt would be min assist for toilet transfers       Tub/Shower Transfer Details (indicate cue type and reason): Did not occur   General ADL Comments: Pt found seated in recliner upon OT entering room. Pt performed stretch to BLEs, reaching down to feet. Pt then performed sit to/from stands X5 to focus on sit to/from stands for ADL tasks. 1st sit to stand pt required min assist and cueing for hand placement, sequencing, BLE placement. 2nd sit to stand pt min guard assist and less cueing. 3-5th sit to stand pt supervision and cues for forward leaning with nose over toes to increase independence and safety with transfers. Pt took increased time and effort for each sit to/from stand.            Cognition   Behavior During Therapy: WFL for tasks assessed/performed Overall Cognitive Status: Within Functional Limits for tasks assessed                Pertinent Vitals/ Pain       Pain Assessment: 0-10 Pain Score: 6  Pain Location: RLE Pain Descriptors / Indicators: Aching;Tightness Pain Intervention(s): Limited activity within patient's tolerance;Monitored during session  Frequency  Min 2X/week     Progress Toward Goals  OT Goals(current goals can now be found in the care plan section)  Progress towards OT goals: Progressing toward goals  Acute Rehab OT Goals Patient Stated Goal: get better OT Goal Formulation: With patient/family Time For Goal Achievement: 02/02/17 Potential to Achieve Goals: Good  Plan Discharge plan needs to be updated    End of Session Equipment Utilized During Treatment:  Gait belt;Rolling walker   Activity Tolerance Patient tolerated treatment well   Patient Left in chair;with call bell/phone within reach     Time: 1222-1252 OT Time Calculation (min): 30 min  Charges: OT General Charges $OT Visit: 1 Procedure OT Treatments $Self Care/Home Management : 23-37 mins  Edwin Cap , MS, OTR/L, CLT Pager: [phone] 01/20/2017, 1:04 PM

## 2017-01-20 NOTE — Care Management Note (Signed)
Case Management Note  Patient Details  Name: Andre Hoffman MRN: [id number] Date of Birth: 1971-04-09  Subjective/Objective:  46 yr old gentleman s/p right total Knee arthroplasty.                 Action/Plan:  Case manager spoke with patient and his wife concerning Home Health and DME needs. Patient was preoperatively setup with [facility], no changes. They have rolling walker and 3in1 at home. Patient HGB has been trending around 7.2, MD will monitor and discharge on tomorrow if patient medically ready.     Expected Discharge Date:  01/21/17               Expected Discharge Plan:  Home w Home Health Services  In-House Referral:  NA  Discharge planning Services  CM Consult  Post Acute Care Choice:  Home Health Choice offered to:  Patient  DME Arranged:   (Has rolling walker and 3in1) DME Agency:     HH Arranged:  PT HH Agency:  [facility] (formerly [facility])  Status of Service:  Completed, signed off  If discussed at [employer] Meetings, dates discussed:    Additional Comments:  Durenda GuthrieBrady, [name] [name], RN 01/20/2017, 11:11 AM

## 2017-01-21 ENCOUNTER — Other Ambulatory Visit: Payer: Self-pay

## 2017-01-21 ENCOUNTER — Inpatient Hospital Stay (HOSPITAL_COMMUNITY): Payer: 59

## 2017-01-21 DIAGNOSIS — N189 Chronic kidney disease, unspecified: Secondary | ICD-10-CM

## 2017-01-21 DIAGNOSIS — D62 Acute posthemorrhagic anemia: Secondary | ICD-10-CM

## 2017-01-21 DIAGNOSIS — N179 Acute kidney failure, unspecified: Secondary | ICD-10-CM | POA: Diagnosis not present

## 2017-01-21 LAB — BASIC METABOLIC PANEL
Anion gap: 11 (ref 5–15)
BUN: 62 mg/dL — AB (ref 6–20)
CALCIUM: 8.3 mg/dL — AB (ref 8.9–10.3)
CO2: 21 mmol/L — ABNORMAL LOW (ref 22–32)
CREATININE: 2.93 mg/dL — AB (ref 0.61–1.24)
Chloride: 105 mmol/L (ref 101–111)
GFR calc Af Amer: 28 mL/min — ABNORMAL LOW (ref >60)
GFR calc non Af Amer: 24 mL/min — ABNORMAL LOW (ref >60)
GLUCOSE: 149 mg/dL — AB (ref 65–99)
Potassium: 4.9 mmol/L (ref 3.5–5.1)
Sodium: 137 mmol/L (ref 135–145)

## 2017-01-21 LAB — CBC
HCT: 18.4 % — ABNORMAL LOW (ref 39.0–52.0)
Hemoglobin: 6 g/dL — CL (ref 13.0–17.0)
MCH: 27.9 pg (ref 26.0–34.0)
MCHC: 32.6 g/dL (ref 30.0–36.0)
MCV: 85.6 fL (ref 78.0–100.0)
PLATELETS: 245 10*3/uL (ref 150–400)
RBC: 2.15 MIL/uL — ABNORMAL LOW (ref 4.22–5.81)
RDW: 17 % — AB (ref 11.5–15.5)
WBC: 13.2 10*3/uL — AB (ref 4.0–10.5)

## 2017-01-21 LAB — HEMOGLOBIN AND HEMATOCRIT, BLOOD
HEMATOCRIT: 24 % — AB (ref 39.0–52.0)
HEMOGLOBIN: 7.9 g/dL — AB (ref 13.0–17.0)

## 2017-01-21 LAB — TROPONIN I: Troponin I: <0.03 ng/mL (ref <0.03)

## 2017-01-21 LAB — PREPARE RBC (CROSSMATCH)

## 2017-01-21 MED ORDER — NOREPINEPHRINE BITARTRATE 1 MG/ML IV SOLN: 0.0000 ug/min | INTRAVENOUS | Status: DC

## 2017-01-21 MED ORDER — SODIUM CHLORIDE 0.9 % IV SOLN: Freq: Once | INTRAVENOUS | Status: DC

## 2017-01-21 NOTE — Progress Notes (Addendum)
CRITICAL VALUE ALERT  Critical value received:  Hemoglobin 6.0  Date of notification:  01/21/2017  Time of notification:  04:36  Critical value read back: yes  Nurse who received alert:  Priscille KluverVivian Pixie Burgener  MD notified (1st page):  Dr. Linna CapriceSwinteck or Dr. On call  Time of first page:  04:37  MD notified (2nd page):  Time of second page:  Responding MD: Dimitri PedAmber Constable  Time MD responded:  04:47

## 2017-01-21 NOTE — Progress Notes (Signed)
   Subjective:  Patient reports pain as mild to moderate.  Denies N/V/CP/SOB.   Objective:   VITALS:   Vitals:   01/20/17 2153 01/21/17 0446 01/21/17 0524 01/21/17 0545  BP: 138/66 (!) 113/57 136/67 126/65  Pulse: 84 77 79 80  Resp: 14  16 16   Temp: 98.2 F (36.8 C) 98.6 F (37 C) 98.7 F (37.1 C) 98.5 F (36.9 C)  TempSrc: Oral Oral Oral Oral  SpO2: 100% 96% 100% 100%  Weight:      Height:        NAD ABD soft Sensation intact distally Intact pulses distally Dorsiflexion/Plantar flexion intact Incision: dressing C/D/I Compartment soft    Lab Results  Component Value Date   WBC 13.2 (H) 01/21/2017   HGB 6.0 (LL) 01/21/2017   HCT 18.4 (L) 01/21/2017   MCV 85.6 01/21/2017   PLT 245 01/21/2017   BMET    Component Value Date/Time   NA 137 01/21/2017 0342   K 4.9 01/21/2017 0342   CL 105 01/21/2017 0342   CO2 21 (L) 01/21/2017 0342   GLUCOSE 149 (H) 01/21/2017 0342   BUN 62 (H) 01/21/2017 0342   CREATININE 2.93 (H) 01/21/2017 0342   CALCIUM 8.3 (L) 01/21/2017 0342   GFRNONAA 24 (L) 01/21/2017 0342   GFRAA 28 (L) 01/21/2017 0342     Assessment/Plan: 3 Days Post-Op   Principal Problem:   Osteoarthritis of right knee   WBAT with walker Hypotension: secondary to ABLA, cont IVFs AoCKD: gentle IVFs, will consult hospitalist  ABLA: hgb 6.0 this am, transfuse 2 units PRBCs DVT ppx: ASA, SCDs, TEDs PT/OT Dispo: hospitalist consult pending,  D/C home when medically stable   Murvin Gift, Cloyde ReamsBrian Kymere 01/21/2017, 7:41 AM   Samson FredericBrian Marquee Fuchs, MD Cell [phone]

## 2017-01-21 NOTE — Progress Notes (Signed)
Physical Therapy Treatment Patient Details Name: Andre Hoffman MRN: [id number] DOB: 11-21-1971 Today's Date: 01/21/2017    History of Present Illness 46 yo male no s/p right TKA. PMH: HTN, Obesity, CHF, L direct antrior THA (~4 months ago).    PT Comments    Patient continues to make progress toward mobility goals and tolerated increased gait distance this session. Continue to progress as tolerated with anticipated d/c home with HHPT.   Follow Up Recommendations  Home health PT;Supervision/Assistance - 24 hour     Equipment Recommendations  Rolling walker with 5" wheels;3in1 (PT);Other (comment) (bariatric)    Recommendations for Other Services Rehab consult     Precautions / Restrictions Precautions Precautions: Fall;Knee Precaution Comments: reviewed precautions/positioning Restrictions Weight Bearing Restrictions: Yes RLE Weight Bearing: Weight bearing as tolerated    Mobility  Bed Mobility Overal bed mobility: Modified Independent Bed Mobility: Supine to Sit           General bed mobility comments: increased time and effort  Transfers Overall transfer level: Needs assistance Equipment used: Rolling walker (2 wheeled) Transfers: Sit to/from Stand Sit to Stand: Min guard         General transfer comment: cues for hand placement; carry over of technique  Ambulation/Gait Ambulation/Gait assistance: Min guard Ambulation Distance (Feet):  (31ft X2) Assistive device: Rolling walker (2 wheeled) Gait Pattern/deviations: Decreased weight shift to right;Trunk flexed;Step-through pattern;Decreased stride length Gait velocity: decreased   General Gait Details: one seated rest break; cues for posture, sequencing, and proximity of RW; pt tends to flex trunk and lean on RW when becoming fatigued however did demonstrate increased activity tolerance this session   Stairs            Wheelchair Mobility    Modified Rankin (Stroke Patients Only)        Balance Overall balance assessment: Needs assistance Sitting-balance support: No upper extremity supported;Feet supported Sitting balance-Leahy Scale: Good       Standing balance-Leahy Scale: Poor Standing balance comment: reliant on RW                    Cognition Arousal/Alertness: Awake/alert Behavior During Therapy: WFL for tasks assessed/performed Overall Cognitive Status: Within Functional Limits for tasks assessed                      Exercises Total Joint Exercises Quad Sets: AROM;Right;10 reps Heel Slides: AAROM;Right;10 reps Goniometric ROM: ~90 degrees flexion    General Comments        Pertinent Vitals/Pain Pain Assessment: Faces Faces Pain Scale: Hurts little more Pain Location: right knee Pain Descriptors / Indicators: Tightness;Sore Pain Intervention(s): Limited activity within patient's tolerance;Monitored during session;Premedicated before session;Repositioned    Home Living                      Prior Function            PT Goals (current goals can now be found in the care plan section) Acute Rehab PT Goals Patient Stated Goal: get better PT Goal Formulation: With patient Time For Goal Achievement: 02/02/17 Potential to Achieve Goals: Good Progress towards PT goals: Progressing toward goals    Frequency    7X/week      PT Plan Current plan remains appropriate    Co-evaluation             End of Session Equipment Utilized During Treatment: Gait belt Activity Tolerance: Patient tolerated treatment well Patient left:  with call bell/phone within reach;in chair     Time: 1110-1141 PT Time Calculation (min) (ACUTE ONLY): 31 min  Charges:  $Gait Training: 8-22 mins $Therapeutic Exercise: 8-22 mins                    G Codes:      Derek MoundKellyn R Bhavika Schnider Arrietty Dercole, PTA Pager: [phone]   01/21/2017, 2:31 PM

## 2017-01-21 NOTE — Progress Notes (Signed)
PT Cancellation Note  Patient Details Name: Mendel RyderJames II Faw MRN: [id number] DOB: 02/15/1971   Cancelled Treatment:    Reason Eval/Treat Not Completed: Patient at procedure or test/unavailable. Pt at US. PT will continue to follow acutely.    Derek MoundKellyn R Dane Kopke Jackie Littlejohn, PTA Pager: [phone]   01/21/2017, 3:22 PM

## 2017-01-21 NOTE — Progress Notes (Signed)
Left message with Dr. Kathline MagicSwinteck's office to let him know patient's hemoglobin.  It came up from 6.0 to 7.9.

## 2017-01-21 NOTE — Consult Note (Signed)
Medical Consultation   Andre Hoffman  ZOX:[id number]  DOB: [birth year]  DOA: 01/18/2017  PCP: Georgann Housekeeper, MD (Confirm with patient/family/NH records and if not entered, this has to be entered at [facility] point of entry)  Outpatient Specialists: Dr. Eldridge Dace   Requesting physician: Dr. Leane Call, ortho  Reason for consultation: medical management of CKD and anemia  History of Present Illness: Andre Hoffman is an 46 y.o. male with history of right knee pain and functional disability due to arthritis who has failed non-surgical conservative treatments for greater than 12 weeks and underwent total knee replacement on 01/19/2016. His other medical history is significant of gout, CHF with EF 40-45% with mild hypokinesis by Echo in 2014 and HTN Patient developed ABLA with hypotension in post op period and worsening of renal function. He was given IV hydration, but renal function continued to deteriorate form 1.31(preop) -->1.82 (yesterday) -->2.93 (today). His Hgb dropped from 12.2 (pre-op) -->  6.0 (today) We were asked to see patient in consultation to assist with medical management He denied chest pain, SOB, nausea, vomiting, diarrhea, abdominal pain.  ROS :As per HPI otherwise 10 point review of systems negative.    Past Medical History: Past Medical History:  Diagnosis Date  . Arthritis    OSTEO  . CHF (congestive heart failure) (HCC)    Echo- 2014- EF 40-45%; mild LVH.Takes Lasix daily  . History of bronchitis    3 yrs ago  . History of gout    only once   . Hypertension    takes Coreg and Losartan daily  . Joint pain   . White coat hypertension     Past Surgical History: Past Surgical History:  Procedure Laterality Date  . KNEE ARTHROPLASTY Right 01/18/2017   Procedure: RIGHT TOTAL KNEE ARTHROPLASTY WITH COMPUTER NAVIGATION;  Surgeon: Samson Frederic, MD;  Location: [facility] OR;  Service: Orthopedics;  Laterality: Right;  . TOTAL HIP ARTHROPLASTY Left 08/31/2016   Procedure: LEFT TOTAL HIP ARTHROPLASTY ANTERIOR APPROACH;  Surgeon: Samson Frederic, MD;  Location: [facility] OR;  Service: Orthopedics;  Laterality: Left;  Requesting RNFA  . TOTAL KNEE ARTHROPLASTY Right 01/18/2017  . TRICEPS TENDON REPAIR Bilateral      Allergies:   Allergies  Allergen Reactions  . Lisinopril Swelling    Angioedema  . Penicillins Swelling    SWELLING REACTION UNSPECIFIED as a child  Has patient had a PCN reaction causing immediate rash, facial/tongue/throat swelling, SOB or lightheadedness with hypotension: Unknown Has patient had a PCN reaction causing severe rash involving mucus membranes or skin necrosis: Unknown Has patient had a PCN reaction that required hospitalization: Unknown Has patient had a PCN reaction occurring within the last 10 years: No If all of the above answers are "NO", then may proceed with Cephalosporin use.       Social History:  reports that he has never smoked. He has never used smokeless tobacco. He reports that he does not drink alcohol or use drugs.   Family History: Family History  Problem Relation Age of Onset  . Other Father     MVA  . Hypertension Paternal Grandfather   . Other Paternal Grandfather     cardiac arrest  . Diabetes Paternal Grandfather   . Heart attack Paternal Grandfather     Physical Exam: Vitals:   01/21/17 0446 01/21/17 0524 01/21/17 0545 01/21/17 0858  BP: (!) 113/57 136/67 126/65 (!) 149/69  Pulse: 77 79  80 82  Resp:  16 16 17   Temp: 98.6 F (37 C) 98.7 F (37.1 C) 98.5 F (36.9 C) 98.6 F (37 C)  TempSrc: Oral Oral Oral Oral  SpO2: 96% 100% 100% 100%  Weight:      Height:        Constitutional: Alert and awake, oriented x3, not in any acute distress. Eyes: PERLA, EOMI, irises appear normal, anicteric sclera,  ENMT: external ears and nose appear normal, hearing is intact, lips appears normal, oropharynx mucosa, tongue, posterior pharynx appear normal  Neck: neck appears normal, no masses,  normal ROM, no thyromegaly, no JVD  CVS: S1-S2 clear, no murmur rubs or gallops, no LE edema, normal pedal pulses  Respiratory:  clear to auscultation bilaterally, no wheezing, rales or rhonchi. Respiratory effort normal. No accessory muscle use.  Abdomen: soft nontender, nondistended, normal bowel sounds, no hepatosplenomegaly, no hernias  Musculoskeletal: : no cyanosis, clubbing or edema noted bilaterally, left kne has surgical dressing in place that is intact Neuro: Cranial nerves II-XII intact, strength, sensation, reflexes Psych: judgement and insight appear normal, stable mood and affect, mental status Skin: no rashes or lesions or ulcers, no induration or nodules   Data reviewed:  I have personally reviewed following labs and imaging studies  Labs:  CBC:  Recent Labs Lab 01/19/17 0423 01/20/17 0618 01/21/17 0342  WBC 9.9 12.3* 13.2*  HGB 7.6* 7.2* 6.0*  HCT 23.7* 22.3* 18.4*  MCV 86.2 86.4 85.6  PLT 222 230 245    Basic Metabolic Panel:  Recent Labs Lab 01/19/17 0423 01/21/17 0342  NA 139 137  K 4.7 4.9  CL 106 105  CO2 26 21*  GLUCOSE 154* 149*  BUN 25* 62*  CREATININE 1.82* 2.93*  CALCIUM 8.4* 8.3*   GFR Estimated Creatinine Clearance: 51.1 mL/min (by C-G formula based on SCr of 2.93 mg/dL (H)).   Microbiology Recent Results (from the past 240 hour(s))  Surgical pcr screen     Status: None   Collection Time: 01/11/17 11:50 AM  Result Value Ref Range Status   MRSA, PCR NEGATIVE NEGATIVE Final   Staphylococcus aureus NEGATIVE NEGATIVE Final    Comment:        The Xpert SA Assay (FDA approved for NASAL specimens in patients over 46 years of age), is one component of a comprehensive surveillance program.  Test performance has been validated by [facility] for patients greater than or equal to 46 year old. It is not intended to diagnose infection nor to guide or monitor treatment.        Inpatient Medications:   Scheduled Meds: . sodium  chloride   Intravenous Once  . amLODipine  5 mg Oral Daily  . aspirin  81 mg Oral BID  . carvedilol  25 mg Oral BID  . docusate sodium  100 mg Oral BID  . oxyCODONE  10 mg Oral Q12H  . potassium chloride  10 mEq Oral Daily  . senna  2 tablet Oral QHS   Continuous Infusions: . sodium chloride Stopped (01/19/17 1714)     Radiological Exams on Admission: No results found.  Impression/Recommendations Principal Problem:   Osteoarthritis of right knee Active Problems:   Essential hypertension, benign   Congestive heart failure (HCC)   Acute blood loss anemia  ABLA Transfusion of 2 units of PRBC's in progress Continue to monitor HH Goal of Hgb with CHF and CKD is around 10.0 g/dL, continue to monitor   Acute kidney injury Will hold Cozaar  and oral Lasix Follow renal indices, monitor UOP, avoid nephrotoxic drugs  CHF Patient is hemodynamically stable, appears euvolemic Continue to monitor daily weights Lasix is now on hold d/t RI  Osteoarthritis, s/p right TKR Followed by orthopedic service  Thank you for this consultation.  Our [facility] hospitalist team will follow the patient with you.   Time Spent: 75 minutes   Raymon Mutton M.D. [facility] 01/21/2017, 9:47 AM

## 2017-01-22 DIAGNOSIS — N179 Acute kidney failure, unspecified: Secondary | ICD-10-CM

## 2017-01-22 DIAGNOSIS — D62 Acute posthemorrhagic anemia: Secondary | ICD-10-CM

## 2017-01-22 DIAGNOSIS — I10 Essential (primary) hypertension: Secondary | ICD-10-CM

## 2017-01-22 DIAGNOSIS — M1711 Unilateral primary osteoarthritis, right knee: Principal | ICD-10-CM

## 2017-01-22 DIAGNOSIS — I5022 Chronic systolic (congestive) heart failure: Secondary | ICD-10-CM

## 2017-01-22 LAB — COMPREHENSIVE METABOLIC PANEL
ALBUMIN: 2.8 g/dL — AB (ref 3.5–5.0)
ALK PHOS: 87 U/L (ref 38–126)
ALT: 29 U/L (ref 17–63)
ANION GAP: 11 (ref 5–15)
AST: 42 U/L — AB (ref 15–41)
BUN: 47 mg/dL — AB (ref 6–20)
CALCIUM: 8.7 mg/dL — AB (ref 8.9–10.3)
CO2: 22 mmol/L (ref 22–32)
CREATININE: 1.61 mg/dL — AB (ref 0.61–1.24)
Chloride: 108 mmol/L (ref 101–111)
GFR calc Af Amer: 58 mL/min — ABNORMAL LOW (ref >60)
GFR calc non Af Amer: 50 mL/min — ABNORMAL LOW (ref >60)
GLUCOSE: 143 mg/dL — AB (ref 65–99)
Potassium: 3.9 mmol/L (ref 3.5–5.1)
Sodium: 141 mmol/L (ref 135–145)
Total Bilirubin: 0.6 mg/dL (ref 0.3–1.2)
Total Protein: 6.8 g/dL (ref 6.5–8.1)

## 2017-01-22 LAB — CBC
HCT: 24.3 % — ABNORMAL LOW (ref 39.0–52.0)
Hemoglobin: 7.8 g/dL — ABNORMAL LOW (ref 13.0–17.0)
MCH: 27.1 pg (ref 26.0–34.0)
MCHC: 32.1 g/dL (ref 30.0–36.0)
MCV: 84.4 fL (ref 78.0–100.0)
Platelets: 314 10*3/uL (ref 150–400)
RBC: 2.88 MIL/uL — AB (ref 4.22–5.81)
RDW: 16.9 % — ABNORMAL HIGH (ref 11.5–15.5)
WBC: 9.9 10*3/uL (ref 4.0–10.5)

## 2017-01-22 MED ORDER — AMLODIPINE BESYLATE 10 MG PO TABS
10.0000 mg | ORAL_TABLET | Freq: Every day | ORAL | Status: DC
  Administered 2017-01-23 – 2017-01-24 (×2): 10 mg via ORAL
  Filled 2017-01-22 (×2): qty 1

## 2017-01-22 NOTE — Progress Notes (Signed)
PROGRESS NOTE    Andre Hoffman  NGE:[id number] DOB: Oct 19, 1971 DOA: 01/18/2017 PCP: Andre Housekeeper, MD   Brief Narrative:  Andre Hoffman is an 46 y.o. male with history of right knee pain and functional disability due to arthritiswho has failed non-surgical conservative treatments for greater than 12 weeks and underwent total knee replacement on 01/19/2016. His other medical history is significant of gout, CHF with EF 40-45% with mild hypokinesis by Echo in 2014 and HTN. Patient developed ABLA with hypotension in post op period and worsening of renal function. He was given IV hydration, but renal function continued to deteriorate form 1.31(preop) -->1.82 -->2.93 (yesterday). His Hgb dropped from 12.2 (pre-op) --> 6.0 (yesterday). [facility] were asked to see patient in consultation to assist with medical management.  Assessment & Plan:   Principal Problem:   Osteoarthritis of right knee Active Problems:   Essential hypertension, benign   Congestive heart failure (HCC)   Acute blood loss anemia   Acute on chronic renal failure (HCC)  Acute Blood Loss Anemia -Patient is s/p Transfusion of 2 units of PRBC's  -Hb/Hct went from 6.0/18.4 -> 7.8/24.3 -Continue to monitor H/H and repeat CBC in AM -Goal of Hgb with CHF and CKD is around 10.0 g/dL, continue to monitor   Acute kidney injury likely ATN from Hypovolemia from ABLA -Patient's BUN/Cr went from 62/2.93 -> 47/1.61 -Upon Review of Records unclear whether patient has CKD at base line as only records we have are from 08/2016 which showed Cr ranging from 0.99-1.3 and this was when he had his Hip Replacement;  -Continue to hold Cozaar and po Lasix for now -Follow renal indices, monitor UOP, avoid nephrotoxic drugs -Renal Ultrasound was normal and mentioned no evidence of CKD -Repeat BMP in AM  Chronic Systolic CHF with EF of 40-45% -Patient is hemodynamically stable, appears euvolemic -Continue to monitor with Strict I's and  O's and Daily Weights -C/w ASA and Coreg 25 mg po BID -Lasix is now on hold due to Renal Insufficiency   Hypertension increased Amlodipine to 10 mg po Daily as Cozaar has been held -C/w Coreg 25 mg po BID  Leukocytosis -Likely Reactive -Now Resolved as WBC went from 13.2 -> 9.9 -Repeat CBC in AM  Osteoarthritis, s/p right TKR -Managed by [facility]opedic Service -Pain Control per Ortho  DVT prophylaxis: Per Primary; ASA, SCD's, TED's Code Status: FULL CODE Family Communication: Discussed with Wife at Bedside Disposition Plan: Likely D/C Home with Home Health in AM if Cr Improved and Blood Count stable; Advised Follow up with PCP on Monday to repeat Bloodwork  Consultants:   [facility]  Procedures: Right TKR   Antimicrobials:  Anti-infectives    Start     Dose/Rate Route Frequency Ordered Stop   01/18/17 2300  vancomycin (VANCOCIN) IVPB 1000 mg/200 mL premix     1,000 mg 200 mL/hr over 60 Minutes Intravenous Every 12 hours 01/18/17 2057 01/18/17 2256   01/18/17 0930  vancomycin (VANCOCIN) 1,500 mg in sodium chloride 0.9 % 500 mL IVPB     1,500 mg 250 mL/hr over 120 Minutes Intravenous To [facility] 01/15/17 1440 01/18/17 1217     Subjective: Seen and examined at bedside and had no complaints and denied SOB, N/V, Lightheadedness or dizziness.   Objective: Vitals:   01/21/17 1900 01/22/17 0518 01/22/17 0952 01/22/17 1500  BP: (!) 160/78 (!) 147/79 (!) 170/81 (!) 168/79  Pulse: 82 85  84  Resp: 16 16  16   Temp: 98.4 F (  36.9 C) 98.2 F (36.8 C)  98.1 F (36.7 C)  TempSrc: Oral Oral  Oral  SpO2: 100% 100%  100%  Weight:      Height:        Intake/Output Summary (Last 24 hours) at 01/22/17 1630 Last data filed at 01/22/17 1500  Gross per 24 hour  Intake              600 ml  Output             2100 ml  Net            -1500 ml   Filed Weights   01/18/17 0921 01/19/17 0500 01/21/17 1024  Weight: (!) 155.6 kg (343 lb) (!) 160.1 kg (352 lb  15.3 oz) (!) 165.6 kg (365 lb 1.3 oz)   Examination: Physical Exam:  Constitutional: WN/WD morbidly obese AAM, NAD and appears calm and comfortable Eyes: Lids and conjunctivae normal, sclerae anicteric  ENMT: External Ears, Nose appear normal. Grossly normal hearing.  Neck: Appears normal, supple, no cervical masses, normal ROM, no appreciable thyromegaly Respiratory: Clear to auscultation bilaterally, no wheezing, rales, rhonchi or crackles. Normal respiratory effort and patient is not tachypenic. No accessory muscle use.  Cardiovascular: RRR, no murmurs / rubs / gallops. S1 and S2 auscultated. No extremity edema. 2+ pedal pulses. No carotid bruits.  Abdomen: Soft, non-tender, non-distended. No masses palpated. No appreciable hepatosplenomegaly. Bowel sounds positive.  GU: Deferred. Musculoskeletal: No clubbing / cyanosis of digits/nails. No joint deformity upper and lower extremities. Skin: No rashes, lesions, ulcers. No induration; Warm and dry. Right Knee incision C/D/I Neurologic: CN 2-12 grossly intact with no focal deficits. Romberg sign cerebellar reflexes not assessed.  Psychiatric: Normal judgment and insight. Alert and oriented x 3. Normal mood and appropriate affect.   Data Reviewed: I have personally reviewed following labs and imaging studies  CBC:  Recent Labs Lab 01/19/17 0423 01/20/17 0618 01/21/17 0342 01/21/17 1614 01/22/17 0555  WBC 9.9 12.3* 13.2*  --  9.9  HGB 7.6* 7.2* 6.0* 7.9* 7.8*  HCT 23.7* 22.3* 18.4* 24.0* 24.3*  MCV 86.2 86.4 85.6  --  84.4  PLT 222 230 245  --  314   Basic Metabolic Panel:  Recent Labs Lab 01/19/17 0423 01/21/17 0342 01/22/17 0555  NA 139 137 141  K 4.7 4.9 3.9  CL 106 105 108  CO2 26 21* 22  GLUCOSE 154* 149* 143*  BUN 25* 62* 47*  CREATININE 1.82* 2.93* 1.61*  CALCIUM 8.4* 8.3* 8.7*   GFR: Estimated Creatinine Clearance: 94.8 mL/min (by C-G formula based on SCr of 1.61 mg/dL (H)). Liver Function Tests:  Recent  Labs Lab 01/22/17 0555  AST 42*  ALT 29  ALKPHOS 87  BILITOT 0.6  PROT 6.8  ALBUMIN 2.8*   No results for input(s): LIPASE, AMYLASE in the last 168 hours. No results for input(s): AMMONIA in the last 168 hours. Coagulation Profile: No results for input(s): INR, PROTIME in the last 168 hours. Cardiac Enzymes:  Recent Labs Lab 01/21/17 1614  TROPONINI <0.03   BNP (last 3 results) No results for input(s): PROBNP in the last 8760 hours. HbA1C: No results for input(s): HGBA1C in the last 72 hours. CBG: No results for input(s): GLUCAP in the last 168 hours. Lipid Profile: No results for input(s): CHOL, HDL, LDLCALC, TRIG, CHOLHDL, LDLDIRECT in the last 72 hours. Thyroid Function Tests: No results for input(s): TSH, T4TOTAL, FREET4, T3FREE, THYROIDAB in the last 72 hours. Anemia Panel: No  results for input(s): VITAMINB12, FOLATE, FERRITIN, TIBC, IRON, RETICCTPCT in the last 72 hours. Sepsis Labs: No results for input(s): PROCALCITON, LATICACIDVEN in the last 168 hours.  No results found for this or any previous visit (from the past 240 hour(s)).   Radiology Studies: US Renal  Result Date: 01/21/2017 CLINICAL DATA:  Acute renal insufficiency. EXAM: RENAL / URINARY TRACT ULTRASOUND COMPLETE COMPARISON:  None. FINDINGS: Right Kidney: Length: 11.1 cm. Echogenicity within normal limits. No mass or hydronephrosis visualized. Left Kidney: Length: 11.8 cm. Echogenicity within normal limits. No mass or hydronephrosis visualized. Bladder: Appears normal for degree of bladder distention. Bilateral ureteral jets seen. IMPRESSION: Normal renal ultrasound. Electronically Signed   By: Ted Mcalpine M.D.   On: 01/21/2017 15:53   Scheduled Meds: . sodium chloride   Intravenous Once  . [START ON 01/23/2017] amLODipine  10 mg Oral Daily  . aspirin  81 mg Oral BID  . carvedilol  25 mg Oral BID  . docusate sodium  100 mg Oral BID  . oxyCODONE  10 mg Oral Q12H  . potassium chloride  10 mEq  Oral Daily  . senna  2 tablet Oral QHS   Continuous Infusions: . sodium chloride Stopped (01/19/17 1714)    LOS: 4 days   Merlene Laughter, DO [facility] Pager [phone]  If 7PM-7AM, please contact night-coverage [email or web address] Password [email or web address] 01/22/2017, 4:30 PM

## 2017-01-22 NOTE — Progress Notes (Addendum)
Physical Therapy Treatment Patient Details Name: Andre Hoffman MRN: [id number] DOB: Jul 13, 1971 Today's Date: 01/22/2017    History of Present Illness 46 yo male no s/p right TKA. PMH: HTN, Obesity, CHF, L direct antrior THA (~4 months ago).    PT Comments    This session focused on HEP. Patient tolerated  R LE therex well and continues to progress with ROM. Reviewed handout and frequency. Current plan remains appropriate.   Follow Up Recommendations  Home health PT;Supervision/Assistance - 24 hour     Equipment Recommendations  Rolling walker with 5" wheels;3in1 (PT);Other (comment) (bariatric)    Recommendations for Other Services Rehab consult     Precautions / Restrictions Precautions Precautions: Fall;Knee Precaution Comments: reviewed precautions/positioning Restrictions Weight Bearing Restrictions: Yes RLE Weight Bearing: Weight bearing as tolerated    Mobility  Bed Mobility Overal bed mobility: Modified Independent Bed Mobility: Supine to Sit           General bed mobility comments: pt OOB in chair upon arrival  Transfers Overall transfer level: Needs assistance Equipment used: Rolling walker (2 wheeled) Transfers: Sit to/from Stand Sit to Stand: Min guard         General transfer comment: safe hand placement; cues for foot placement and technique  Ambulation/Gait Ambulation/Gait assistance: Min guard Ambulation Distance (Feet): 60 Feet Assistive device: Rolling walker (2 wheeled) Gait Pattern/deviations: Trunk flexed;Step-through pattern;Decreased stride length Gait velocity: decreased   General Gait Details: cues for posture; pt with good step through pattern and WB on R LE   Stairs Stairs: Yes   Stair Management: No rails;Backwards;With walker Number of Stairs: 2 General stair comments: cues for sequencing/technique and safety; assist to stabilize RW  Wheelchair Mobility    Modified Rankin (Stroke Patients Only)       Balance  Overall balance assessment: Needs assistance Sitting-balance support: No upper extremity supported;Feet supported Sitting balance-Leahy Scale: Good       Standing balance-Leahy Scale: Poor Standing balance comment: reliant on RW                    Cognition Arousal/Alertness: Awake/alert Behavior During Therapy: WFL for tasks assessed/performed Overall Cognitive Status: Within Functional Limits for tasks assessed                      Exercises Total Joint Exercises Quad Sets: AROM;Right;10 reps Short Arc Quad: AROM;Right;10 reps Heel Slides: AROM;Right;10 reps;AAROM Hip ABduction/ADduction: AROM;Right;10 reps Straight Leg Raises: AAROM;Right;10 reps Long Arc Quad: AROM;Right;10 reps Knee Flexion: AROM;Right;5 reps;Seated;Other (comment) (10 sec holds) Goniometric ROM: 3-90    General Comments General comments (skin integrity, edema, etc.): wife present in room      Pertinent Vitals/Pain Pain Assessment: Faces Faces Pain Scale: Hurts even more (with knee flexion) Pain Location: right knee Pain Descriptors / Indicators: Tightness;Sore;Grimacing Pain Intervention(s): Limited activity within patient's tolerance;Monitored during session;Premedicated before session;Repositioned    Home Living                      Prior Function            PT Goals (current goals can now be found in the care plan section) Acute Rehab PT Goals Patient Stated Goal: go home PT Goal Formulation: With patient Time For Goal Achievement: 02/02/17 Potential to Achieve Goals: Good Progress towards PT goals: Progressing toward goals    Frequency    7X/week      PT Plan Current plan remains appropriate  Co-evaluation             End of Session Equipment Utilized During Treatment: Gait belt Activity Tolerance: Patient tolerated treatment well Patient left: with call bell/phone within reach;in chair     Time: 1331-1356 PT Time Calculation (min) (ACUTE  ONLY): 25 min  Charges:   $Therapeutic Exercise: 23-37 mins                     G Codes:      Derek Mound, PTA Pager: [phone]   01/22/2017, 2:26 PM

## 2017-01-22 NOTE — Progress Notes (Signed)
   Subjective:  Patient reports pain as mild to moderate.  Denies N/V/CP/SOB. Hospitalist saw patient yesterday; received PRBCs  Objective:   VITALS:   Vitals:   01/21/17 1305 01/21/17 1900 01/22/17 0518 01/22/17 0952  BP: (!) 153/73 (!) 160/78 (!) 147/79 (!) 170/81  Pulse: 82 82 85   Resp: 15 16 16    Temp: 98.1 F (36.7 C) 98.4 F (36.9 C) 98.2 F (36.8 C)   TempSrc: Oral Oral Oral   SpO2: 100% 100% 100%   Weight:      Height:        NAD ABD soft Sensation intact distally Intact pulses distally Dorsiflexion/Plantar flexion intact Incision: dressing C/D/I Compartment soft    Lab Results  Component Value Date   WBC 9.9 01/22/2017   HGB 7.8 (L) 01/22/2017   HCT 24.3 (L) 01/22/2017   MCV 84.4 01/22/2017   PLT 314 01/22/2017   BMET    Component Value Date/Time   NA 141 01/22/2017 0555   K 3.9 01/22/2017 0555   CL 108 01/22/2017 0555   CO2 22 01/22/2017 0555   GLUCOSE 143 (H) 01/22/2017 0555   BUN 47 (H) 01/22/2017 0555   CREATININE 1.61 (H) 01/22/2017 0555   CALCIUM 8.7 (L) 01/22/2017 0555   GFRNONAA 50 (L) 01/22/2017 0555   GFRAA 58 (L) 01/22/2017 0555     Assessment/Plan: 4 Days Post-Op   Principal Problem:   Osteoarthritis of right knee Active Problems:   Essential hypertension, benign   Congestive heart failure (HCC)   Acute blood loss anemia   Acute on chronic renal failure (HCC)   WBAT with walker Hypotension: resolved AoCKD: Cr nearly back to baseline ABLA: received 2 units PRBCs with good response DVT ppx: ASA, SCDs, TEDs PT/OT Dispo: D/C home when ok with hospitalist   Joangel Vanosdol, Cloyde ReamsBrian Maki 01/22/2017, 10:44 AM   Samson FredericBrian Jessalyn Hinojosa, MD Cell [phone]) [phone]

## 2017-01-22 NOTE — Progress Notes (Signed)
Physical Therapy Treatment Patient Details Name: Andre Hoffman Ault MRN: [id number] DOB: [birth year] Today's Date: 01/22/2017    History of Present Illness 46 yo male no s/p right TKA. PMH: HTN, Obesity, CHF, L direct antrior THA (~4 months ago).    PT Comments    Patient continues to make gradual progress toward mobility goals. Continues to demonstrate decreased activity tolerance and fatigues with stair management. Discussed options for managing stairs with pt and wife. Continue to progress as tolerated with anticipated d/c home with HHPT.   Follow Up Recommendations  Home health PT;Supervision/Assistance - 24 hour     Equipment Recommendations  Rolling walker with 5" wheels;3in1 (PT);Other (comment) (bariatric)    Recommendations for Other Services Rehab consult     Precautions / Restrictions Precautions Precautions: Fall;Knee Precaution Comments: reviewed precautions/positioning Restrictions Weight Bearing Restrictions: Yes RLE Weight Bearing: Weight bearing as tolerated    Mobility  Bed Mobility Overal bed mobility: Modified Independent Bed Mobility: Supine to Sit           General bed mobility comments: increased time and effort  Transfers Overall transfer level: Needs assistance Equipment used: Rolling walker (2 wheeled) Transfers: Sit to/from Stand Sit to Stand: Min guard         General transfer comment: safe hand placement; cues for foot placement and technique  Ambulation/Gait Ambulation/Gait assistance: Min guard Ambulation Distance (Feet): 60 Feet Assistive device: Rolling walker (2 wheeled) Gait Pattern/deviations: Trunk flexed;Step-through pattern;Decreased stride length Gait velocity: decreased   General Gait Details: cues for posture; pt with good step through pattern and WB on R LE   Stairs Stairs: Yes   Stair Management: No rails;Backwards;With walker Number of Stairs: 2 General stair comments: cues for sequencing/technique and safety;  assist to stabilize RW  Wheelchair Mobility    Modified Rankin (Stroke Patients Only)       Balance Overall balance assessment: Needs assistance Sitting-balance support: No upper extremity supported;Feet supported Sitting balance-Leahy Scale: Good       Standing balance-Leahy Scale: Poor Standing balance comment: reliant on RW                    Cognition Arousal/Alertness: Awake/alert Behavior During Therapy: WFL for tasks assessed/performed Overall Cognitive Status: Within Functional Limits for tasks assessed                      Exercises Total Joint Exercises Quad Sets: AROM;Right;10 reps    General Comments General comments (skin integrity, edema, etc.): wife present in room      Pertinent Vitals/Pain Pain Assessment: Faces Faces Pain Scale: Hurts little more (4-6) Pain Location: right knee Pain Descriptors / Indicators: Tightness;Sore;Grimacing;Guarding Pain Intervention(s): Limited activity within patient's tolerance;Monitored during session;Repositioned;Patient requesting pain meds-RN notified    Home Living                      Prior Function            PT Goals (current goals can now be found in the care plan section) Acute Rehab PT Goals Patient Stated Goal: go home PT Goal Formulation: With patient Time For Goal Achievement: 02/02/17 Potential to Achieve Goals: Good Progress towards PT goals: Progressing toward goals    Frequency    7X/week      PT Plan Current plan remains appropriate    Co-evaluation             End of Session Equipment Utilized During Treatment: Gait  belt Activity Tolerance: Patient tolerated treatment well Patient left: with call bell/phone within reach;in chair;with family/visitor present     Time: 0911-0957 PT Time Calculation (min) (ACUTE ONLY): 46 min  Charges:  $Gait Training: 23-37 mins $Therapeutic Activity: 8-22 mins                    G Codes:      Derek Mound, PTA Pager: [phone]   01/22/2017, 11:02 AM

## 2017-01-23 LAB — CBC WITH DIFFERENTIAL/PLATELET
BASOS ABS: 0 10*3/uL (ref 0.0–0.1)
Basophils Relative: 0 %
EOS PCT: 3 %
Eosinophils Absolute: 0.2 10*3/uL (ref 0.0–0.7)
HCT: 24.3 % — ABNORMAL LOW (ref 39.0–52.0)
Hemoglobin: 7.7 g/dL — ABNORMAL LOW (ref 13.0–17.0)
LYMPHS PCT: 24 %
Lymphs Abs: 2 10*3/uL (ref 0.7–4.0)
MCH: 27 pg (ref 26.0–34.0)
MCHC: 31.7 g/dL (ref 30.0–36.0)
MCV: 85.3 fL (ref 78.0–100.0)
MONO ABS: 0.7 10*3/uL (ref 0.1–1.0)
Monocytes Relative: 9 %
Neutro Abs: 5.3 10*3/uL (ref 1.7–7.7)
Neutrophils Relative %: 64 %
PLATELETS: 372 10*3/uL (ref 150–400)
RBC: 2.85 MIL/uL — ABNORMAL LOW (ref 4.22–5.81)
RDW: 17 % — AB (ref 11.5–15.5)
WBC: 8.3 10*3/uL (ref 4.0–10.5)

## 2017-01-23 LAB — COMPREHENSIVE METABOLIC PANEL
ALT: 25 U/L (ref 17–63)
AST: 27 U/L (ref 15–41)
Albumin: 2.8 g/dL — ABNORMAL LOW (ref 3.5–5.0)
Alkaline Phosphatase: 72 U/L (ref 38–126)
Anion gap: 10 (ref 5–15)
BUN: 28 mg/dL — ABNORMAL HIGH (ref 6–20)
CHLORIDE: 107 mmol/L (ref 101–111)
CO2: 24 mmol/L (ref 22–32)
Calcium: 9.2 mg/dL (ref 8.9–10.3)
Creatinine, Ser: 1.19 mg/dL (ref 0.61–1.24)
Glucose, Bld: 173 mg/dL — ABNORMAL HIGH (ref 65–99)
POTASSIUM: 4.3 mmol/L (ref 3.5–5.1)
Sodium: 141 mmol/L (ref 135–145)
Total Bilirubin: 0.7 mg/dL (ref 0.3–1.2)
Total Protein: 6.9 g/dL (ref 6.5–8.1)

## 2017-01-23 LAB — BASIC METABOLIC PANEL
Anion gap: 7 (ref 5–15)
BUN: 26 mg/dL — ABNORMAL HIGH (ref 6–20)
CHLORIDE: 108 mmol/L (ref 101–111)
CO2: 25 mmol/L (ref 22–32)
CREATININE: 1.21 mg/dL (ref 0.61–1.24)
Calcium: 9.1 mg/dL (ref 8.9–10.3)
GFR calc non Af Amer: >60 mL/min (ref >60)
Glucose, Bld: 177 mg/dL — ABNORMAL HIGH (ref 65–99)
Potassium: 4.4 mmol/L (ref 3.5–5.1)
SODIUM: 140 mmol/L (ref 135–145)

## 2017-01-23 LAB — CBC
HCT: 24.7 % — ABNORMAL LOW (ref 39.0–52.0)
HEMOGLOBIN: 7.9 g/dL — AB (ref 13.0–17.0)
MCH: 27.3 pg (ref 26.0–34.0)
MCHC: 32 g/dL (ref 30.0–36.0)
MCV: 85.5 fL (ref 78.0–100.0)
Platelets: 355 10*3/uL (ref 150–400)
RBC: 2.89 MIL/uL — AB (ref 4.22–5.81)
RDW: 17 % — ABNORMAL HIGH (ref 11.5–15.5)
WBC: 8.3 10*3/uL (ref 4.0–10.5)

## 2017-01-23 LAB — PHOSPHORUS: PHOSPHORUS: 3.8 mg/dL (ref 2.5–4.6)

## 2017-01-23 LAB — PREPARE RBC (CROSSMATCH)

## 2017-01-23 LAB — MAGNESIUM: MAGNESIUM: 1.9 mg/dL (ref 1.7–2.4)

## 2017-01-23 MED ORDER — HYDRALAZINE HCL 50 MG PO TABS
50.0000 mg | ORAL_TABLET | Freq: Three times a day (TID) | ORAL | Status: DC
Start: 2017-01-23 — End: 2017-01-24
  Administered 2017-01-23 – 2017-01-24 (×2): 50 mg via ORAL
  Filled 2017-01-23 (×2): qty 1

## 2017-01-23 MED ORDER — SODIUM CHLORIDE 0.9 % IV SOLN
Freq: Once | INTRAVENOUS | Status: AC
  Administered 2017-01-23: 17:00:00 via INTRAVENOUS

## 2017-01-23 MED ORDER — HYDRALAZINE HCL 20 MG/ML IJ SOLN
10.0000 mg | Freq: Once | INTRAMUSCULAR | Status: AC
  Administered 2017-01-23: 10 mg via INTRAVENOUS
  Filled 2017-01-23: qty 1

## 2017-01-23 MED ORDER — SODIUM CHLORIDE 0.9 % IV SOLN: Freq: Once | INTRAVENOUS | Status: DC

## 2017-01-23 MED ORDER — HYDRALAZINE HCL 20 MG/ML IJ SOLN: 10.0000 mg | Freq: Four times a day (QID) | INTRAMUSCULAR | Status: DC | PRN

## 2017-01-23 NOTE — Progress Notes (Signed)
In formed PA Babish about patients blood pressure and that  Dr. Marland McalpineSheikh wants him to stay another night for his Bp. Per PA cancel D/C order.

## 2017-01-23 NOTE — Progress Notes (Signed)
Informed Dr. Sheikh that patients BP Andre Mcalpineis still 188/86 after 1mg  of dilaudid and 10mg  IV hydralazine. Per MD call Attending to cancel Discharge. Per MD still give blood at this time.

## 2017-01-23 NOTE — Progress Notes (Signed)
Physical Therapy Treatment Patient Details Name: Andre Hoffman MRN: [id number] DOB: January 26, 1971 Today's Date: 01/23/2017    History of Present Illness 46 yo male no s/p right TKA. PMH: HTN, Obesity, CHF, L direct antrior THA (~4 months ago).    PT Comments    Patient tolerated increase in therex repetition this session and is able to perform HEP with less assistance. Current plan remains appropriate.   Follow Up Recommendations  Home health PT;Supervision/Assistance - 24 hour     Equipment Recommendations  Rolling walker with 5" wheels;3in1 (PT);Other (comment) (bariatric)    Recommendations for Other Services Rehab consult     Precautions / Restrictions Precautions Precautions: Fall;Knee Precaution Comments: reviewed precautions/positioning Restrictions Weight Bearing Restrictions: Yes RLE Weight Bearing: Weight bearing as tolerated    Mobility  Bed Mobility Overal bed mobility: Modified Independent Bed Mobility: Supine to Sit           General bed mobility comments: increased time and effort  Transfers Overall transfer level: Needs assistance Equipment used: Rolling walker (2 wheeled) Transfers: Sit to/from Stand Sit to Stand: Min guard         General transfer comment: from EOB and recliner; less cues needed this am; carry over of safe hand placement and technique; min guard for safety  Ambulation/Gait Ambulation/Gait assistance: Supervision Ambulation Distance (Feet): 100 Feet Assistive device: Rolling walker (2 wheeled) Gait Pattern/deviations: Step-through pattern;Trunk flexed     General Gait Details: cues for posture and relaxing shoulders; heavy reliance on RW however did demonstrate improved activity tolerance    Stairs Stairs: Yes   Stair Management: No rails;Step to pattern;Backwards;With walker Number of Stairs: 2 General stair comments: carry over of sequencing; assist to stabilize RW; pt more steady this session  Wheelchair Mobility     Modified Rankin (Stroke Patients Only)       Balance Overall balance assessment: Needs assistance Sitting-balance support: No upper extremity supported;Feet supported Sitting balance-Leahy Scale: Good                              Cognition Arousal/Alertness: Awake/alert Behavior During Therapy: WFL for tasks assessed/performed Overall Cognitive Status: Within Functional Limits for tasks assessed                      Exercises Total Joint Exercises Quad Sets: AROM;Both;15 reps Short Arc Quad: AROM;Right;15 reps Heel Slides: AAROM;Right;15 reps Hip ABduction/ADduction: AROM;Right;15 reps Straight Leg Raises: AAROM;Right;10 reps Long Arc Quad: AROM;Right;15 reps Goniometric ROM: 92 degrees flexion    General Comments        Pertinent Vitals/Pain Pain Assessment: Faces Faces Pain Scale: Hurts little more Pain Location: right knee Pain Descriptors / Indicators: Tightness;Sore Pain Intervention(s): Limited activity within patient's tolerance;Monitored during session;Premedicated before session;Repositioned    Home Living                      Prior Function            PT Goals (current goals can now be found in the care plan section) Acute Rehab PT Goals Patient Stated Goal: go home PT Goal Formulation: With patient Time For Goal Achievement: 02/02/17 Potential to Achieve Goals: Good Progress towards PT goals: Progressing toward goals    Frequency    7X/week      PT Plan Current plan remains appropriate    Co-evaluation  End of Session Equipment Utilized During Treatment: Gait belt Activity Tolerance: Patient tolerated treatment well Patient left: with call bell/phone within reach;in chair     Time: [id number] PT Time Calculation (min) (ACUTE ONLY): 24 min  Charges:  $Gait Training: 23-37 mins $Therapeutic Exercise: 23-37 mins                    G Codes:      Andre MoundKellyn R Harvey Matlack [name],  PTA Pager: [phone]   01/23/2017, 3:13 PM

## 2017-01-23 NOTE — Progress Notes (Addendum)
PROGRESS NOTE    Andre Hoffman  EAV:[id number] DOB: 29-Dec-1970 DOA: 01/18/2017 PCP: Georgann HousekeeperHUSAIN,KARRAR, MD   Brief Narrative:  Andre RyderJames II Mayson is an 46 y.o. male with history of right knee pain and functional disability due to arthritiswho has failed non-surgical conservative treatments for greater than 12 weeks and underwent total knee replacement on 01/19/2016. His other medical history is significant of gout, CHF with EF 40-45% with mild hypokinesis by Echo in 2014 and HTN. Patient developed ABLA with hypotension in post op period and worsening of renal function. He was given IV hydration, but renal function continued to deteriorate form 1.31(preop) -->1.82 -->2.93 (yesterday). His Hgb dropped from 12.2 (pre-op) --> 6.0 (yesterday). [facility] were asked to see patient in consultation to assist with medical management. Blood Count was still around 7.9 today so ordered another unit of blood. Will transfuse 1 unit of pRBC. Was going to D/C Home with Home PT after blood however BP remained elevated so D/C was held. Will start patient on po Hydralazine for now. Has follow up with PCP on Monday to have blood rechecked and discuss about restarting Cozaar and Lasix.   Assessment & Plan:   Principal Problem:   Osteoarthritis of right knee Active Problems:   Essential hypertension, benign   Congestive heart failure (HCC)   Acute blood loss anemia   Acute on chronic renal failure (HCC)  Acute Blood Loss Anemia -Patient is s/p Transfusion of 2 units of PRBC's; will order 1 more unit of blood  -Hb/Hct went from 6.0/18.4 -> 7.8/24.3 -> 7.9/24.7 -Repeat CBC in AM after blood -Goal of Hgb with CHF and CKD is closer to 10.0 g/dL, continue to monitor   Acute kidney injury likely ATN from Hypovolemia from ABLA -Patient's BUN/Cr went from 62/2.93 -> 47/1.61 -> 26/1.21 -Upon Review of Records unclear whether patient has CKD at base line as only records we have are from 08/2016 which showed Cr ranging  from 0.99-1.3 and this was when he had his Hip Replacement;  -Continue to hold Cozaar and po Lasix for now -Follow renal indices, monitor UOP, avoid nephrotoxic drugs -Renal Ultrasound was normal and mentioned no evidence of CKD -Repeat BMP in AM -Continue to Hold Cozaar and Losartan; Follow up [facility].   Chronic Systolic CHF with EF of 40-45% -Patient is hemodynamically stable, appears euvolemic -Continue to monitor with Strict I's and O's and Daily Weights -C/w ASA and Coreg 25 mg po BID -Lasix and Cozaar is now on hold due to Renal Insufficiency   Hypertension -Increased Amlodipine to 10 mg po Daily as Cozaar has been held -C/w Coreg 25 mg po BID **Patient's BP was elevated at 190/85 and Repeat was 188/86 after IV Hydralazine prior to D/C -Will start po Hydralazine because of Kidney Dysfunction  Leukocytosis -Likely Reactive -Now Resolved as WBC went from 13.2 -> 9.9 -> 8.3 -Repeat CBC in AM  Osteoarthritis, s/p right TKR -Managed by [facility] -Pain Control per Ortho  DVT prophylaxis: Per Primary; ASA, SCD's, TED's Code Status: FULL CODE Family Communication: No family at bedside Disposition Plan: Discharge held because of elevated Bp; Transfuse 1 unit of blood and will start po Hydralazine; If blood count improves and Cr improved and BP stable ok to D/C tomorrow AM.   Consultants:   [facility]  Procedures: Right TKR   Antimicrobials:  Anti-infectives    Start     Dose/Rate Route Frequency Ordered Stop  01/18/17 2300  vancomycin (VANCOCIN) IVPB 1000 mg/200 mL premix     1,000 mg 200 mL/hr over 60 Minutes Intravenous Every 12 hours 01/18/17 2057 01/18/17 2256   01/18/17 0930  vancomycin (VANCOCIN) 1,500 mg in sodium chloride 0.9 % 500 mL IVPB     1,500 mg 250 mL/hr over 120 Minutes Intravenous To [facility] 01/15/17 1440 01/18/17 1217     Subjective: Seen and examined  at bedside and had no complaintsand denied SOB, N/V, Lightheadedness or dizziness. Cr trending down. Hb unchanged and a little low. Will transfuse 1 unit of blood. Was ok to D/C Home with Home Health this Afternoon but prior to D/C BP was severely elevated so D/C held. Patient has outpatient follow up with PCP on Monday to repeat bloodwork. Will need to discuss with PCP to reinitiate Cozaar and Lasix after kidney Fxn resolves, for now will start Hydralazine po.    Objective: Vitals:   01/22/17 0952 01/22/17 1500 01/22/17 2022 01/23/17 0526  BP: (!) 170/81 (!) 168/79 (!) 156/89 (!) 145/71  Pulse:  84 92 70  Resp:  16 16 18   Temp:  98.1 F (36.7 C) 98.7 F (37.1 C) 98.3 F (36.8 C)  TempSrc:  Oral Oral Oral  SpO2:  100% 100% 100%  Weight:    (!) 162.2 kg (357 lb 9.4 oz)  Height:        Intake/Output Summary (Last 24 hours) at 01/23/17 1410 Last data filed at 01/23/17 0900  Gross per 24 hour  Intake              720 ml  Output             2150 ml  Net            -1430 ml   Filed Weights   01/19/17 0500 01/21/17 1024 01/23/17 0526  Weight: (!) 160.1 kg (352 lb 15.3 oz) (!) 165.6 kg (365 lb 1.3 oz) (!) 162.2 kg (357 lb 9.4 oz)   Examination: Physical Exam:  Constitutional: WN/WD morbidly obese AAM, NAD and appears calm and comfortable Eyes: Lids and conjunctivae normal, sclerae anicteric  ENMT: External Ears, Nose appear normal. Grossly normal hearing.  Neck: Appears normal, supple, no cervical masses, normal ROM, no appreciable thyromegaly Respiratory: Clear to auscultation bilaterally, no wheezing, rales, rhonchi or crackles. Normal respiratory effort and patient is not tachypenic. No accessory muscle use.  Cardiovascular: RRR, no murmurs / rubs / gallops. S1 and S2 auscultated. No extremity edema. 2+ pedal pulses. No carotid bruits.  Abdomen: Soft, non-tender, non-distended. No masses palpated. No appreciable hepatosplenomegaly. Bowel sounds positive.  GU:  Deferred. Musculoskeletal: No clubbing / cyanosis of digits/nails. No joint deformity upper and lower extremities. Skin: No rashes, lesions, ulcers. No induration; Warm and dry. Right Knee incision C/D/I Neurologic: CN 2-12 grossly intact with no focal deficits. Romberg sign cerebellar reflexes not assessed.  Psychiatric: Normal judgment and insight. Alert and oriented x 3. Normal mood and appropriate affect.   Data Reviewed: I have personally reviewed following labs and imaging studies  CBC:  Recent Labs Lab 01/19/17 0423 01/20/17 0618 01/21/17 0342 01/21/17 1614 01/22/17 0555 01/23/17 0853  WBC 9.9 12.3* 13.2*  --  9.9 8.3  8.3  NEUTROABS  --   --   --   --   --  5.3  HGB 7.6* 7.2* 6.0* 7.9* 7.8* 7.7*  7.9*  HCT 23.7* 22.3* 18.4* 24.0* 24.3* 24.3*  24.7*  MCV 86.2 86.4 85.6  --  84.4 85.3  85.5  PLT 222 230 245  --  314 372  355   Basic Metabolic Panel:  Recent Labs Lab 01/19/17 0423 01/21/17 0342 01/22/17 0555 01/23/17 0853  NA 139 137 141 141  140  K 4.7 4.9 3.9 4.3  4.4  CL 106 105 108 107  108  CO2 26 21* 22 24  25   GLUCOSE 154* 149* 143* 173*  177*  BUN 25* 62* 47* 28*  26*  CREATININE 1.82* 2.93* 1.61* 1.19  1.21  CALCIUM 8.4* 8.3* 8.7* 9.2  9.1  MG  --   --   --  1.9  PHOS  --   --   --  3.8   GFR: Estimated Creatinine Clearance: 124.7 mL/min (by C-G formula based on SCr of 1.21 mg/dL). Liver Function Tests:  Recent Labs Lab 01/22/17 0555 01/23/17 0853  AST 42* 27  ALT 29 25  ALKPHOS 87 72  BILITOT 0.6 0.7  PROT 6.8 6.9  ALBUMIN 2.8* 2.8*   No results for input(s): LIPASE, AMYLASE in the last 168 hours. No results for input(s): AMMONIA in the last 168 hours. Coagulation Profile: No results for input(s): INR, PROTIME in the last 168 hours. Cardiac Enzymes:  Recent Labs Lab 01/21/17 1614  TROPONINI <0.03   BNP (last 3 results) No results for input(s): PROBNP in the last 8760 hours. HbA1C: No results for input(s): HGBA1C in  the last 72 hours. CBG: No results for input(s): GLUCAP in the last 168 hours. Lipid Profile: No results for input(s): CHOL, HDL, LDLCALC, TRIG, CHOLHDL, LDLDIRECT in the last 72 hours. Thyroid Function Tests: No results for input(s): TSH, T4TOTAL, FREET4, T3FREE, THYROIDAB in the last 72 hours. Anemia Panel: No results for input(s): VITAMINB12, FOLATE, FERRITIN, TIBC, IRON, RETICCTPCT in the last 72 hours. Sepsis Labs: No results for input(s): PROCALCITON, LATICACIDVEN in the last 168 hours.  No results found for this or any previous visit (from the past 240 hour(s)).   Radiology Studies: US Renal  Result Date: 01/21/2017 CLINICAL DATA:  Acute renal insufficiency. EXAM: RENAL / URINARY TRACT ULTRASOUND COMPLETE COMPARISON:  None. FINDINGS: Right Kidney: Length: 11.1 cm. Echogenicity within normal limits. No mass or hydronephrosis visualized. Left Kidney: Length: 11.8 cm. Echogenicity within normal limits. No mass or hydronephrosis visualized. Bladder: Appears normal for degree of bladder distention. Bilateral ureteral jets seen. IMPRESSION: Normal renal ultrasound. Electronically Signed   By: Ted Mcalpine M.D.   On: 01/21/2017 15:53   Scheduled Meds: . sodium chloride   Intravenous Once  . sodium chloride   Intravenous Once  . amLODipine  10 mg Oral Daily  . aspirin  81 mg Oral BID  . carvedilol  25 mg Oral BID  . docusate sodium  100 mg Oral BID  . oxyCODONE  10 mg Oral Q12H  . potassium chloride  10 mEq Oral Daily  . senna  2 tablet Oral QHS   Continuous Infusions: . sodium chloride Stopped (01/19/17 1714)    LOS: 5 days   Merlene Laughter, DO [facility] Pager [phone]  If 7PM-7AM, please contact night-coverage [email or web address] Password [email or web address] 01/23/2017, 2:10 PM

## 2017-01-23 NOTE — Progress Notes (Signed)
     Subjective: 5 Days Post-Op Procedure(s) (LRB): RIGHT TOTAL KNEE ARTHROPLASTY WITH COMPUTER NAVIGATION (Right)   Patient reports pain as mild, pain controlled. No events throughout the night. Discussed the reason for him staying and being evaluated.  Patient feels that he is doing well.  Lab values are normalizing since yesterday,  BUN 47 -> 26; Creat 1.61 ->1.21; and H&H stayed at 7.9.  He feels that he is ready to go home.   Objective:   VITALS:   Vitals:   01/22/17 2022 01/23/17 0526  BP: (!) 156/89 (!) 145/71  Pulse: 92 70  Resp: 16 18  Temp: 98.7 F (37.1 C) 98.3 F (36.8 C)    Dorsiflexion/Plantar flexion intact Incision: dressing C/D/I No cellulitis present Compartment soft  LABS  Recent Labs  01/21/17 0342 01/21/17 1614 01/22/17 0555 01/23/17 0853  HGB 6.0* 7.9* 7.8* 7.7*  7.9*  HCT 18.4* 24.0* 24.3* 24.3*  24.7*  WBC 13.2*  --  9.9 8.3  8.3  PLT 245  --  314 372  355     Recent Labs  01/21/17 0342 01/22/17 0555 01/23/17 0853  NA 137 141 141  140  K 4.9 3.9 4.3  4.4  BUN 62* 47* 28*  26*  CREATININE 2.93* 1.61* 1.19  1.21  GLUCOSE 149* 143* 173*  177*     Assessment/Plan: 5 Days Post-Op Procedure(s) (LRB): RIGHT TOTAL KNEE ARTHROPLASTY WITH COMPUTER NAVIGATION (Right) Up with therapy Discharge home [facility] Follow up with Dr. Linna CapriceSwinteck in 2 weeks.  Contact information:  [facility] [address], [address] [place] [id number] [phone]        Anastasio AuerbachMatthew S. Bette Brienza   PAC  01/23/2017, 12:54 PM

## 2017-01-23 NOTE — Progress Notes (Signed)
Inf0rmed Dr. Marland McalpineSheikh that patients BP is 190/95. Ordered placed for IV hydralazine

## 2017-01-23 NOTE — Progress Notes (Signed)
Physical Therapy Treatment Patient Details Name: Andre Hoffman MRN: [id number] DOB: [birth year] Today's Date: 01/23/2017    History of Present Illness 46 yo male no s/p right TKA. PMH: HTN, Obesity, CHF, L direct antrior THA (~4 months ago).    PT Comments    Patient continues to progress gradually and demonstrated increased activity tolerance this session. Current plan remains appropriate.   Follow Up Recommendations  Home health PT;Supervision/Assistance - 24 hour     Equipment Recommendations  Rolling walker with 5" wheels;3in1 (PT);Other (comment) (bariatric)    Recommendations for Other Services Rehab consult     Precautions / Restrictions Precautions Precautions: Fall;Knee Precaution Comments: reviewed precautions/positioning Restrictions Weight Bearing Restrictions: Yes RLE Weight Bearing: Weight bearing as tolerated    Mobility  Bed Mobility Overal bed mobility: Modified Independent Bed Mobility: Supine to Sit           General bed mobility comments: increased time and effort  Transfers Overall transfer level: Needs assistance Equipment used: Rolling walker (2 wheeled) Transfers: Sit to/from Stand Sit to Stand: Min guard         General transfer comment: from EOB and recliner; less cues needed this am; carry over of safe hand placement and technique; min guard for safety  Ambulation/Gait Ambulation/Gait assistance: Supervision Ambulation Distance (Feet): 100 Feet Assistive device: Rolling walker (2 wheeled) Gait Pattern/deviations: Step-through pattern;Trunk flexed     General Gait Details: cues for posture and relaxing shoulders; heavy reliance on RW however did demonstrate improved activity tolerance    Stairs Stairs: Yes   Stair Management: No rails;Step to pattern;Backwards;With walker Number of Stairs: 2 General stair comments: carry over of sequencing; assist to stabilize RW; pt more steady this session  Wheelchair Mobility     Modified Rankin (Stroke Patients Only)       Balance Overall balance assessment: Needs assistance Sitting-balance support: No upper extremity supported;Feet supported Sitting balance-Leahy Scale: Good                              Cognition Arousal/Alertness: Awake/alert Behavior During Therapy: WFL for tasks assessed/performed Overall Cognitive Status: Within Functional Limits for tasks assessed                      Exercises      General Comments        Pertinent Vitals/Pain Pain Assessment: Faces Faces Pain Scale: Hurts little more Pain Location: right knee Pain Descriptors / Indicators: Tightness;Sore Pain Intervention(s): Limited activity within patient's tolerance;Monitored during session;Premedicated before session;Repositioned    Home Living                      Prior Function            PT Goals (current goals can now be found in the care plan section) Acute Rehab PT Goals Patient Stated Goal: go home PT Goal Formulation: With patient Time For Goal Achievement: 02/02/17 Potential to Achieve Goals: Good Progress towards PT goals: Progressing toward goals    Frequency    7X/week      PT Plan Current plan remains appropriate    Co-evaluation             End of Session Equipment Utilized During Treatment: Gait belt Activity Tolerance: Patient tolerated treatment well Patient left: with call bell/phone within reach;in chair     Time: [id number] PT Time Calculation (min) (ACUTE ONLY): 32 min  Charges:  $Gait Training: 23-37 mins                    G Codes:      Derek [place], [place] Pager: [phone]   01/23/2017, 11:41 AM

## 2017-01-24 LAB — CBC
HEMATOCRIT: 25.9 % — AB (ref 39.0–52.0)
Hemoglobin: 8.2 g/dL — ABNORMAL LOW (ref 13.0–17.0)
MCH: 27.1 pg (ref 26.0–34.0)
MCHC: 31.7 g/dL (ref 30.0–36.0)
MCV: 85.5 fL (ref 78.0–100.0)
Platelets: 388 10*3/uL (ref 150–400)
RBC: 3.03 MIL/uL — ABNORMAL LOW (ref 4.22–5.81)
RDW: 16.6 % — AB (ref 11.5–15.5)
WBC: 8.9 10*3/uL (ref 4.0–10.5)

## 2017-01-24 LAB — BASIC METABOLIC PANEL
ANION GAP: 7 (ref 5–15)
BUN: 18 mg/dL (ref 6–20)
CALCIUM: 9.3 mg/dL (ref 8.9–10.3)
CO2: 27 mmol/L (ref 22–32)
Chloride: 106 mmol/L (ref 101–111)
Creatinine, Ser: 1.06 mg/dL (ref 0.61–1.24)
GFR calc Af Amer: >60 mL/min (ref >60)
Glucose, Bld: 124 mg/dL — ABNORMAL HIGH (ref 65–99)
POTASSIUM: 4.5 mmol/L (ref 3.5–5.1)
SODIUM: 140 mmol/L (ref 135–145)

## 2017-01-24 NOTE — Progress Notes (Signed)
   Subjective: 6 Days Post-Op Procedure(s) (LRB): RIGHT TOTAL KNEE ARTHROPLASTY WITH COMPUTER NAVIGATION (Right)  Pt feeling better this morning Cr, blood pressure and lab results have all improved this am Ready for d/c home Patient reports pain as mild.  Objective:   VITALS:   Vitals:   01/24/17 0052 01/24/17 0530  BP: (!) 157/84 137/69  Pulse: 78 83  Resp: 18 18  Temp: 98.4 F (36.9 C) 98.7 F (37.1 C)    Right knee incision healing well nv intact distally No rashes or edema  Good rom  LABS  Recent Labs  01/22/17 0555 01/23/17 0853 01/24/17 0305  HGB 7.8* 7.7*  7.9* 8.2*  HCT 24.3* 24.3*  24.7* 25.9*  WBC 9.9 8.3  8.3 8.9  PLT 314 372  355 388     Recent Labs  01/22/17 0555 01/23/17 0853 01/24/17 0305  NA 141 141  140 140  K 3.9 4.3  4.4 4.5  BUN 47* 28*  26* 18  CREATININE 1.61* 1.19  1.21 1.06  GLUCOSE 143* 173*  177* 124*     Assessment/Plan: 6 Days Post-Op Procedure(s) (LRB): RIGHT TOTAL KNEE ARTHROPLASTY WITH COMPUTER NAVIGATION (Right) Plan to d/c home this morning after therapy F/u in 2 weeks Continue therapy at home    Sarah Bush [facility] Center[facility] Inri Sobieski, MPAS, PA-C  01/24/2017, 8:06 AM

## 2017-01-24 NOTE — Discharge Summary (Signed)
Physician Discharge Summary   Patient ID: Andre Hoffman MRN: [id number] DOB/AGE: December 17, 1971 46 y.o.  Admit date: 01/18/2017 Discharge date: 01/24/2017  Admission Diagnoses:  Principal Problem:   Osteoarthritis of right knee Active Problems:   Essential hypertension, benign   Congestive heart failure (HCC)   Acute blood loss anemia   Acute on chronic renal failure [facility])   Discharge Diagnoses:  Same   Surgeries: Procedure(s): RIGHT TOTAL KNEE ARTHROPLASTY WITH COMPUTER NAVIGATION on 01/18/2017   Consultants: Internal medicine, PT/OT  Discharged Condition: Stable  Hospital Course: Andre Hoffman is an 46 y.o. male who was admitted 01/18/2017 with a chief complaint of right knee pain, and found to have a diagnosis of Osteoarthritis of right knee.  They were brought to the operating room on 01/18/2017 and underwent the above named procedures.    The patient had an uncomplicated hospital course and was stable for discharge.  Recent vital signs:  Vitals:   01/24/17 0052 01/24/17 0530  BP: (!) 157/84 137/69  Pulse: 78 83  Resp: 18 18  Temp: 98.4 F (36.9 C) 98.7 F (37.1 C)    Recent laboratory studies:  Results for orders placed or performed during the hospital encounter of 01/18/17  CBC  Result Value Ref Range   WBC 9.9 4.0 - 10.5 K/uL   RBC 2.75 (L) 4.22 - 5.81 MIL/uL   Hemoglobin 7.6 (L) 13.0 - 17.0 g/dL   HCT 40.9 (L) 81.1 - 91.4 %   MCV 86.2 78.0 - 100.0 fL   MCH 27.6 26.0 - 34.0 pg   MCHC 32.1 30.0 - 36.0 g/dL   RDW 78.2 (H) 95.6 - 21.3 %   Platelets 222 150 - 400 K/uL  Basic metabolic panel  Result Value Ref Range   Sodium 139 135 - 145 mmol/L   Potassium 4.7 3.5 - 5.1 mmol/L   Chloride 106 101 - 111 mmol/L   CO2 26 22 - 32 mmol/L   Glucose, Bld 154 (H) 65 - 99 mg/dL   BUN 25 (H) 6 - 20 mg/dL   Creatinine, Ser 0.86 (H) 0.61 - 1.24 mg/dL   Calcium 8.4 (L) 8.9 - 10.3 mg/dL   GFR calc non Af Amer 43 (L) >60 mL/min   GFR calc Af Amer 50 (L) >60 mL/min   Anion gap 7 5 - 15  CBC  Result Value Ref Range   WBC 12.3 (H) 4.0 - 10.5 K/uL   RBC 2.58 (L) 4.22 - 5.81 MIL/uL   Hemoglobin 7.2 (L) 13.0 - 17.0 g/dL   HCT 57.8 (L) 46.9 - 62.9 %   MCV 86.4 78.0 - 100.0 fL   MCH 27.9 26.0 - 34.0 pg   MCHC 32.3 30.0 - 36.0 g/dL   RDW 52.8 (H) 41.3 - 24.4 %   Platelets 230 150 - 400 K/uL  CBC  Result Value Ref Range   WBC 13.2 (H) 4.0 - 10.5 K/uL   RBC 2.15 (L) 4.22 - 5.81 MIL/uL   Hemoglobin 6.0 (LL) 13.0 - 17.0 g/dL   HCT 01.0 (L) 27.2 - 53.6 %   MCV 85.6 78.0 - 100.0 fL   MCH 27.9 26.0 - 34.0 pg   MCHC 32.6 30.0 - 36.0 g/dL   RDW 64.4 (H) 03.4 - 74.2 %   Platelets 245 150 - 400 K/uL  Basic metabolic panel  Result Value Ref Range   Sodium 137 135 - 145 mmol/L   Potassium 4.9 3.5 - 5.1 mmol/L   Chloride 105 101 -  111 mmol/L   CO2 21 (L) 22 - 32 mmol/L   Glucose, Bld 149 (H) 65 - 99 mg/dL   BUN 62 (H) 6 - 20 mg/dL   Creatinine, Ser 1.61 (H) 0.61 - 1.24 mg/dL   Calcium 8.3 (L) 8.9 - 10.3 mg/dL   GFR calc non Af Amer 24 (L) >60 mL/min   GFR calc Af Amer 28 (L) >60 mL/min   Anion gap 11 5 - 15  Troponin I  Result Value Ref Range   Troponin I <0.03 <0.03 ng/mL  Hemoglobin and hematocrit, blood  Result Value Ref Range   Hemoglobin 7.9 (L) 13.0 - 17.0 g/dL   HCT 09.6 (L) 04.5 - 40.9 %  CBC  Result Value Ref Range   WBC 9.9 4.0 - 10.5 K/uL   RBC 2.88 (L) 4.22 - 5.81 MIL/uL   Hemoglobin 7.8 (L) 13.0 - 17.0 g/dL   HCT 81.1 (L) 91.4 - 78.2 %   MCV 84.4 78.0 - 100.0 fL   MCH 27.1 26.0 - 34.0 pg   MCHC 32.1 30.0 - 36.0 g/dL   RDW 95.6 (H) 21.3 - 08.6 %   Platelets 314 150 - 400 K/uL  Comprehensive metabolic panel  Result Value Ref Range   Sodium 141 135 - 145 mmol/L   Potassium 3.9 3.5 - 5.1 mmol/L   Chloride 108 101 - 111 mmol/L   CO2 22 22 - 32 mmol/L   Glucose, Bld 143 (H) 65 - 99 mg/dL   BUN 47 (H) 6 - 20 mg/dL   Creatinine, Ser 5.78 (H) 0.61 - 1.24 mg/dL   Calcium 8.7 (L) 8.9 - 10.3 mg/dL   Total Protein 6.8 6.5 - 8.1 g/dL    Albumin 2.8 (L) 3.5 - 5.0 g/dL   AST 42 (H) 15 - 41 U/L   ALT 29 17 - 63 U/L   Alkaline Phosphatase 87 38 - 126 U/L   Total Bilirubin 0.6 0.3 - 1.2 mg/dL   GFR calc non Af Amer 50 (L) >60 mL/min   GFR calc Af Amer 58 (L) >60 mL/min   Anion gap 11 5 - 15  CBC  Result Value Ref Range   WBC 8.3 4.0 - 10.5 K/uL   RBC 2.89 (L) 4.22 - 5.81 MIL/uL   Hemoglobin 7.9 (L) 13.0 - 17.0 g/dL   HCT 46.9 (L) 62.9 - 52.8 %   MCV 85.5 78.0 - 100.0 fL   MCH 27.3 26.0 - 34.0 pg   MCHC 32.0 30.0 - 36.0 g/dL   RDW 41.3 (H) 24.4 - 01.0 %   Platelets 355 150 - 400 K/uL  Basic metabolic panel  Result Value Ref Range   Sodium 140 135 - 145 mmol/L   Potassium 4.4 3.5 - 5.1 mmol/L   Chloride 108 101 - 111 mmol/L   CO2 25 22 - 32 mmol/L   Glucose, Bld 177 (H) 65 - 99 mg/dL   BUN 26 (H) 6 - 20 mg/dL   Creatinine, Ser 2.72 0.61 - 1.24 mg/dL   Calcium 9.1 8.9 - 53.6 mg/dL   GFR calc non Af Amer >60 >60 mL/min   GFR calc Af Amer >60 >60 mL/min   Anion gap 7 5 - 15  CBC with Differential/Platelet  Result Value Ref Range   WBC 8.3 4.0 - 10.5 K/uL   RBC 2.85 (L) 4.22 - 5.81 MIL/uL   Hemoglobin 7.7 (L) 13.0 - 17.0 g/dL   HCT 64.4 (L) 03.4 - 74.2 %   MCV  85.3 78.0 - 100.0 fL   MCH 27.0 26.0 - 34.0 pg   MCHC 31.7 30.0 - 36.0 g/dL   RDW 16.117.0 (H) 09.611.5 - 04.515.5 %   Platelets 372 150 - 400 K/uL   Neutrophils Relative % 64 %   Neutro Abs 5.3 1.7 - 7.7 K/uL   Lymphocytes Relative 24 %   Lymphs Abs 2.0 0.7 - 4.0 K/uL   Monocytes Relative 9 %   Monocytes Absolute 0.7 0.1 - 1.0 K/uL   Eosinophils Relative 3 %   Eosinophils Absolute 0.2 0.0 - 0.7 K/uL   Basophils Relative 0 %   Basophils Absolute 0.0 0.0 - 0.1 K/uL  Comprehensive metabolic panel  Result Value Ref Range   Sodium 141 135 - 145 mmol/L   Potassium 4.3 3.5 - 5.1 mmol/L   Chloride 107 101 - 111 mmol/L   CO2 24 22 - 32 mmol/L   Glucose, Bld 173 (H) 65 - 99 mg/dL   BUN 28 (H) 6 - 20 mg/dL   Creatinine, Ser 4.091.19 0.61 - 1.24 mg/dL   Calcium 9.2 8.9  - 81.110.3 mg/dL   Total Protein 6.9 6.5 - 8.1 g/dL   Albumin 2.8 (L) 3.5 - 5.0 g/dL   AST 27 15 - 41 U/L   ALT 25 17 - 63 U/L   Alkaline Phosphatase 72 38 - 126 U/L   Total Bilirubin 0.7 0.3 - 1.2 mg/dL   GFR calc non Af Amer >60 >60 mL/min   GFR calc Af Amer >60 >60 mL/min   Anion gap 10 5 - 15  Magnesium  Result Value Ref Range   Magnesium 1.9 1.7 - 2.4 mg/dL  Phosphorus  Result Value Ref Range   Phosphorus 3.8 2.5 - 4.6 mg/dL  Basic metabolic panel  Result Value Ref Range   Sodium 140 135 - 145 mmol/L   Potassium 4.5 3.5 - 5.1 mmol/L   Chloride 106 101 - 111 mmol/L   CO2 27 22 - 32 mmol/L   Glucose, Bld 124 (H) 65 - 99 mg/dL   BUN 18 6 - 20 mg/dL   Creatinine, Ser 9.141.06 0.61 - 1.24 mg/dL   Calcium 9.3 8.9 - 78.210.3 mg/dL   GFR calc non Af Amer >60 >60 mL/min   GFR calc Af Amer >60 >60 mL/min   Anion gap 7 5 - 15  CBC  Result Value Ref Range   WBC 8.9 4.0 - 10.5 K/uL   RBC 3.03 (L) 4.22 - 5.81 MIL/uL   Hemoglobin 8.2 (L) 13.0 - 17.0 g/dL   HCT 95.625.9 (L) 21.339.0 - 08.652.0 %   MCV 85.5 78.0 - 100.0 fL   MCH 27.1 26.0 - 34.0 pg   MCHC 31.7 30.0 - 36.0 g/dL   RDW 57.816.6 (H) 46.911.5 - 62.915.5 %   Platelets 388 150 - 400 K/uL  Prepare RBC  Result Value Ref Range   Order Confirmation ORDER PROCESSED BY BLOOD BANK   Prepare RBC  Result Value Ref Range   Order Confirmation ORDER PROCESSED BY BLOOD BANK   Prepare RBC  Result Value Ref Range   Order Confirmation ORDER PROCESSED BY BLOOD BANK     Discharge Medications:   Allergies as of 01/24/2017      Reactions   Lisinopril Swelling   Angioedema   Penicillins Swelling   SWELLING REACTION UNSPECIFIED as a child Has patient had a PCN reaction causing immediate rash, facial/tongue/throat swelling, SOB or lightheadedness with hypotension: Unknown Has patient had a PCN reaction  causing severe rash involving mucus membranes or skin necrosis: Unknown Has patient had a PCN reaction that required hospitalization: Unknown Has patient had a PCN  reaction occurring within the last 10 years: No If all of the above answers are "NO", then may proceed with Cephalosporin use.      Medication List    TAKE these medications   amLODipine 5 MG tablet Commonly known as:  NORVASC Take 5 mg by mouth daily.   aspirin 81 MG chewable tablet Chew 1 tablet (81 mg total) by mouth 2 (two) times daily.   carvedilol 25 MG tablet Commonly known as:  COREG Take 25 mg by mouth 2 (two) times daily.   docusate sodium 100 MG capsule Commonly known as:  COLACE Take 1 capsule (100 mg total) by mouth 2 (two) times daily.   furosemide 40 MG tablet Commonly known as:  LASIX Take 20 mg by mouth daily.   HYDROcodone-acetaminophen 5-325 MG tablet Commonly known as:  NORCO/VICODIN Take 1-2 tablets by mouth every 4 (four) hours as needed (breakthrough pain).   losartan 100 MG tablet Commonly known as:  COZAAR Take 100 mg by mouth daily.   methocarbamol 500 MG tablet Commonly known as:  ROBAXIN Take 1 tablet (500 mg total) by mouth every 6 (six) hours as needed for muscle spasms.   ondansetron 4 MG tablet Commonly known as:  ZOFRAN Take 1 tablet (4 mg total) by mouth every 6 (six) hours as needed for nausea.   oxyCODONE 10 mg 12 hr tablet Commonly known as:  OXYCONTIN Take 1 tablet (10 mg total) by mouth PRO. 1 tab PO every 12 hours for 3 days, then 1 tab PO daily for 4 days   potassium chloride 10 MEQ tablet Commonly known as:  K-DUR,KLOR-CON Take 10 mEq by mouth daily.   senna 8.6 MG Tabs tablet Commonly known as:  SENOKOT Take 2 tablets (17.2 mg total) by mouth at bedtime.       Diagnostic Studies: US Renal  Result Date: 01/21/2017 CLINICAL DATA:  Acute renal insufficiency. EXAM: RENAL / URINARY TRACT ULTRASOUND COMPLETE COMPARISON:  None. FINDINGS: Right Kidney: Length: 11.1 cm. Echogenicity within normal limits. No mass or hydronephrosis visualized. Left Kidney: Length: 11.8 cm. Echogenicity within normal limits. No mass or  hydronephrosis visualized. Bladder: Appears normal for degree of bladder distention. Bilateral ureteral jets seen. IMPRESSION: Normal renal ultrasound. Electronically Signed   By: Ted Mcalpine M.D.   On: 01/21/2017 15:53   Dg Knee Right Port  Result Date: 01/18/2017 CLINICAL DATA:  Status post right total knee replacement today. EXAM: PORTABLE RIGHT KNEE - 1-2 VIEW COMPARISON:  None. FINDINGS: The patient is status post right total knee arthroplasty. Surgical drain and staples are noted. No fracture or dislocation. Hardware is intact. IMPRESSION: Status post right total knee arthroplasty.  No acute finding. Electronically Signed   By: Drusilla Kanner M.D.   On: 01/18/2017 16:05    Disposition: 06-Home-Health Care Svc  Discharge Instructions    Call MD / Call 911    Complete by:  As directed    If you experience chest pain or shortness of breath, CALL 911 and be transported to the hospital emergency room.  If you develope a fever above 101 F, pus (white drainage) or increased drainage or redness at the wound, or calf pain, call your surgeon's office.   Call MD / Call 911    Complete by:  As directed    If you experience chest pain or  shortness of breath, CALL 911 and be transported to the hospital emergency room.  If you develope a fever above 101 F, pus (white drainage) or increased drainage or redness at the wound, or calf pain, call your surgeon's office.   Constipation Prevention    Complete by:  As directed    Drink plenty of fluids.  Prune juice may be helpful.  You may use a stool softener, such as Colace (over the counter) 100 mg twice a day.  Use MiraLax (over the counter) for constipation as needed.   Constipation Prevention    Complete by:  As directed    Drink plenty of fluids.  Prune juice may be helpful.  You may use a stool softener, such as Colace (over the counter) 100 mg twice a day.  Use MiraLax (over the counter) for constipation as needed.   Diet - low sodium heart  healthy    Complete by:  As directed    Diet - low sodium heart healthy    Complete by:  As directed    Do not put a pillow under the knee. Place it under the heel.    Complete by:  As directed    Driving restrictions    Complete by:  As directed    No driving for 6 weeks   Increase activity slowly as tolerated    Complete by:  As directed    Increase activity slowly as tolerated    Complete by:  As directed    Lifting restrictions    Complete by:  As directed    No lifting for 6 weeks   TED hose    Complete by:  As directed    Use stockings (TED hose) for 2 weeks on both leg(s).  You may remove them at night for sleeping.      Follow-up Information    Swinteck, Cloyde Reams, MD. Schedule an appointment as soon as possible for a visit in 2 week(s).   Specialty:  Orthopedic Surgery Why:  For wound re-check Contact information: [address] [place] [address] [phone]        [facility] Follow up.   Specialty:  Home Health Services Why:  Someone from [facility] will contact you to arrange start date and time for therapy. Contact information: [address] [place] [address] [phone]            Signed: Thea Gist 01/24/2017, 8:10 AM

## 2017-01-24 NOTE — Progress Notes (Signed)
PROGRESS NOTE    Andre Hoffman  AVW:[id number] DOB: 08-14-1971 DOA: 01/18/2017 PCP: Georgann HousekeeperHUSAIN,KARRAR, MD   Brief Narrative:  Andre Hoffman is an 46 y.o. male with history of right knee pain and functional disability due to arthritiswho has failed non-surgical conservative treatments for greater than 12 weeks and underwent total knee replacement on 01/19/2016. His other medical history is significant of gout, CHF with EF 40-45% with mild hypokinesis by Echo in 2014 and HTN. Patient developed ABLA with hypotension in post op period and worsening of renal function. He was given IV hydration, but renal function continued to deteriorate form 1.31(preop) -->1.82 -->2.93. His Hgb dropped from 12.2 (pre-op) --> 6.0. [facility] were asked to see patient in consultation to assist with medical management. Blood Count was still around 7.9 yesterday so ordered another unit of blood. Was transfused 1 unit of pRBC. Discharge was held yesterday because of Elevated Bp's. Doing well this Am. Hb was drawn early however lab result showed it was 8.2 so repeat was ordered for 10:00 Am. Ok to D/C Home with Home PT as patient has has follow up with PCP on Monday to have blood rechecked and discuss about restarting Cozaar and Lasix.   Assessment & Plan:   Principal Problem:   Osteoarthritis of right knee Active Problems:   Essential hypertension, benign   Congestive heart failure (HCC)   Acute blood loss anemia   Acute on chronic renal failure (HCC)  Acute Blood Loss Anemia -Patient is s/p Transfusion of 3 units of PRBC's;   -Hb/Hct went from 6.0/18.4 -> 7.8/24.3 -> 7.9/24.7 -> 8.2/25.9 this AM (repeat H/H for 10:00 AM pending) -Repeat CBC in AM after blood -Goal of Hgb with CHF and CKD is closer to 10.0 g/dL -Ok to D/C if repeat Hb/Hct Stable. -Has Follow up with PCP in AM for blood recheck and   Acute kidney injury likely ATN from Hypovolemia from ABLA -Patient's BUN/Cr went from 62/2.93 -> 47/1.61 ->  26/1.21 -> 18/1.06 -Upon Review of Records unclear whether patient has CKD at base line as only records we have are from 08/2016 which showed Cr ranging from 0.99-1.3 and this was when he had his Hip Replacement;  -Follow renal indices, monitor UOP, avoid nephrotoxic drugs -Renal Ultrasound was normal and mentioned no evidence of CKD -Repeat BMP in AM at PCP's office -Continue to Hold Cozaar and Losartan; Follow up with PCP to [facility].   Chronic Systolic CHF with EF of 40-45% -Patient is hemodynamically stable, appears euvolemic -Continue to monitor with Strict I's and O's and Daily Weights -C/w ASA and Coreg 25 mg po BID -Lasix and Cozaar is now on hold due to Renal Insufficiency; Likely restart in AM after discussion with PCP  Hypertension -Increased Amlodipine to 10 mg po Daily as Cozaar has been held -C/w Coreg 25 mg po BID **Patient's BP was elevated yesterday at 190/85 and Repeat was 188/86 after IV Hydralazine prior to D/C -Started po Hydralazine because of Kidney Dysfunction yesterday and BP better controlled -Restart Lasix and Cozaar after Discussion with PCP in AM  Leukocytosis -Likely Reactive -Now Resolved as WBC went from 13.2 -> 9.9 -> 8.3 -> 8.9 -Repeat CBC in AM  Osteoarthritis, s/p right TKR -Managed by Primary Orthopedic Service -Pain Control per Ortho  DVT prophylaxis: Per Primary; ASA, SCD's, TED's Code Status: FULL CODE Family Communication: No family at bedside Disposition Plan: BP now stable and Hb slightly improved with recheck pending.  Ok to discharge Home with Home Health PT as patient has follow up PCP appointment tomorrow morning.   Consultants:   [facility]  Procedures: Right TKR   Antimicrobials:  Anti-infectives    Start     Dose/Rate Route Frequency Ordered Stop   01/18/17 2300  vancomycin (VANCOCIN) IVPB 1000 mg/200 mL premix     1,000 mg 200 mL/hr over 60 Minutes Intravenous Every  12 hours 01/18/17 2057 01/18/17 2256   01/18/17 0930  vancomycin (VANCOCIN) 1,500 mg in sodium chloride 0.9 % 500 mL IVPB     1,500 mg 250 mL/hr over 120 Minutes Intravenous To [facility] 01/15/17 1440 01/18/17 1217     Subjective: Seen and examined at bedside and had no complaints and denied SOB, N/V, Lightheadedness or dizziness. Cr significantly improved and Hb slightly improved to 8.2. Has recheck ordered. Ok to D/C. Patient has outpatient follow up with PCP on Monday to repeat bloodwork. Will need to discuss with PCP to reinitiate Cozaar and Lasix tomorrow.     Objective: Vitals:   01/23/17 2245 01/23/17 2312 01/24/17 0052 01/24/17 0530  BP: (!) 157/78 (!) 152/80 (!) 157/84 137/69  Pulse: 88 80 78 83  Resp: 20 18 18 18   Temp: 99 F (37.2 C) 98.6 F (37 C) 98.4 F (36.9 C) 98.7 F (37.1 C)  TempSrc: Oral Oral Oral Oral  SpO2:  100% 100% 100%  Weight:    (!) 161.7 kg (356 lb 7.7 oz)  Height:        Intake/Output Summary (Last 24 hours) at 01/24/17 1134 Last data filed at 01/24/17 0531  Gross per 24 hour  Intake             1966 ml  Output             2750 ml  Net             -784 ml   Filed Weights   01/21/17 1024 01/23/17 0526 01/24/17 0530  Weight: (!) 165.6 kg (365 lb 1.3 oz) (!) 162.2 kg (357 lb 9.4 oz) (!) 161.7 kg (356 lb 7.7 oz)   Examination: Physical Exam:  Constitutional: WN/WD morbidly obese AAM, NAD and appears calm and comfortable Eyes: Lids and conjunctivae normal, sclerae anicteric  ENMT: External Ears, Nose appear normal. Grossly normal hearing.  Neck: Appears normal, supple, no cervical masses, normal ROM, no appreciable thyromegaly Respiratory: Clear to auscultation bilaterally, no wheezing, rales, rhonchi or crackles. Normal respiratory effort and patient is not tachypenic. No accessory muscle use.  Cardiovascular: RRR, no murmurs / rubs / gallops. S1 and S2 auscultated. No extremity edema. 2+ pedal pulses. No carotid bruits.  Abdomen: Soft,  non-tender, non-distended. No masses palpated. No appreciable hepatosplenomegaly. Bowel sounds positive.  GU: Deferred. Musculoskeletal: No clubbing / cyanosis of digits/nails. No joint deformity upper and lower extremities. Skin: No rashes, lesions, ulcers. No induration; Warm and dry. Right Knee incision C/D/I Neurologic: CN 2-12 grossly intact with no focal deficits. Romberg sign cerebellar reflexes not assessed.  Psychiatric: Normal judgment and insight. Alert and oriented x 3. Normal mood and appropriate affect.   Data Reviewed: I have personally reviewed following labs and imaging studies  CBC:  Recent Labs Lab 01/20/17 0618 01/21/17 0342 01/21/17 1614 01/22/17 0555 01/23/17 0853 01/24/17 0305  WBC 12.3* 13.2*  --  9.9 8.3  8.3 8.9  NEUTROABS  --   --   --   --  5.3  --   HGB 7.2* 6.0* 7.9* 7.8*  7.7*  7.9* 8.2*  HCT 22.3* 18.4* 24.0* 24.3* 24.3*  24.7* 25.9*  MCV 86.4 85.6  --  84.4 85.3  85.5 85.5  PLT 230 245  --  314 372  355 388   Basic Metabolic Panel:  Recent Labs Lab 01/19/17 0423 01/21/17 0342 01/22/17 0555 01/23/17 0853 01/24/17 0305  NA 139 137 141 141  140 140  K 4.7 4.9 3.9 4.3  4.4 4.5  CL 106 105 108 107  108 106  CO2 26 21* 22 24  25 27   GLUCOSE 154* 149* 143* 173*  177* 124*  BUN 25* 62* 47* 28*  26* 18  CREATININE 1.82* 2.93* 1.61* 1.19  1.21 1.06  CALCIUM 8.4* 8.3* 8.7* 9.2  9.1 9.3  MG  --   --   --  1.9  --   PHOS  --   --   --  3.8  --    GFR: Estimated Creatinine Clearance: 142.1 mL/min (by C-G formula based on SCr of 1.06 mg/dL). Liver Function Tests:  Recent Labs Lab 01/22/17 0555 01/23/17 0853  AST 42* 27  ALT 29 25  ALKPHOS 87 72  BILITOT 0.6 0.7  PROT 6.8 6.9  ALBUMIN 2.8* 2.8*   No results for input(s): LIPASE, AMYLASE in the last 168 hours. No results for input(s): AMMONIA in the last 168 hours. Coagulation Profile: No results for input(s): INR, PROTIME in the last 168 hours. Cardiac Enzymes:  Recent  Labs Lab 01/21/17 1614  TROPONINI <0.03   BNP (last 3 results) No results for input(s): PROBNP in the last 8760 hours. HbA1C: No results for input(s): HGBA1C in the last 72 hours. CBG: No results for input(s): GLUCAP in the last 168 hours. Lipid Profile: No results for input(s): CHOL, HDL, LDLCALC, TRIG, CHOLHDL, LDLDIRECT in the last 72 hours. Thyroid Function Tests: No results for input(s): TSH, T4TOTAL, FREET4, T3FREE, THYROIDAB in the last 72 hours. Anemia Panel: No results for input(s): VITAMINB12, FOLATE, FERRITIN, TIBC, IRON, RETICCTPCT in the last 72 hours. Sepsis Labs: No results for input(s): PROCALCITON, LATICACIDVEN in the last 168 hours.  No results found for this or any previous visit (from the past 240 hour(s)).   Radiology Studies: No results found. Scheduled Meds: . sodium chloride   Intravenous Once  . sodium chloride   Intravenous Once  . amLODipine  10 mg Oral Daily  . aspirin  81 mg Oral BID  . carvedilol  25 mg Oral BID  . docusate sodium  100 mg Oral BID  . hydrALAZINE  50 mg Oral Q8H  . oxyCODONE  10 mg Oral Q12H  . potassium chloride  10 mEq Oral Daily  . senna  2 tablet Oral QHS   Continuous Infusions:   LOS: 6 days   Merlene Laughter, DO [facility] Pager [phone]  If 7PM-7AM, please contact night-coverage [email or web address] Password [email or web address] 01/24/2017, 11:34 AM

## 2017-01-24 NOTE — Progress Notes (Signed)
Reviewed discharge instructions and medications with patient. IV removed. Patient in room eating lunch waiting on his wife to pick him up.

## 2017-01-24 NOTE — Progress Notes (Signed)
Physical Therapy Treatment Patient Details Name: Andre Hoffman MRN: [id number] DOB: 1971-04-05 Today's Date: 01/24/2017    History of Present Illness 46 yo male no s/p right TKA. PMH: HTN, Obesity, CHF, L direct antrior THA (~4 months ago).    PT Comments    Pt is POD 3 and continues to be moving well with therapy but is limited by pain. Pt had elevated BP yesterday delaying his discharge and requiring him to stay an additional day. Pt's BP has improved and he is now cleared for DC home. Performed indoor gait and seated exercises. Pt will benefit from continued therapy upon discharge in order to maximize outcomes.    Follow Up Recommendations  Home health PT     Equipment Recommendations  Rolling walker with 5" wheels;3in1 (PT);Other (comment)    Recommendations for Other Services       Precautions / Restrictions Precautions Precautions: Fall;Knee Precaution Booklet Issued: Yes (comment) Precaution Comments: reviewed precautions/positioning Restrictions Weight Bearing Restrictions: Yes RLE Weight Bearing: Weight bearing as tolerated    Mobility  Bed Mobility               General bed mobility comments: Pt OOB in recliner when PT arrives  Transfers Overall transfer level: Needs assistance Equipment used: Rolling walker (2 wheeled) Transfers: Sit to/from Stand Sit to Stand: Min guard         General transfer comment: Min gaurd for safety from recliner. Requires increased time due to recliner sliding during transfer  Ambulation/Gait Ambulation/Gait assistance: Supervision Ambulation Distance (Feet): 100 Feet Assistive device: Rolling walker (2 wheeled) Gait Pattern/deviations: Step-through pattern;Trunk flexed Gait velocity: decreased Gait velocity interpretation: Below normal speed for age/gender General Gait Details: cues for posture and relaxing shoulders. Heavy reliance on RW but has improved cadence as distance increases   Stairs             Wheelchair Mobility    Modified Rankin (Stroke Patients Only)       Balance                                    Cognition Arousal/Alertness: Awake/alert Behavior During Therapy: WFL for tasks assessed/performed Overall Cognitive Status: Within Functional Limits for tasks assessed                      Exercises Total Joint Exercises Long Arc Quad: AROM;Right;10 reps;Seated Knee Flexion: AROM;Right;10 reps;Seated Goniometric ROM: 75    General Comments        Pertinent Vitals/Pain Pain Assessment: 0-10 Pain Score: 5  Pain Location: right knee Pain Descriptors / Indicators: Tightness;Sore Pain Intervention(s): Monitored during session;Premedicated before session;RN gave pain meds during session    Home Living                      Prior Function            PT Goals (current goals can now be found in the care plan section) Acute Rehab PT Goals Patient Stated Goal: go home Progress towards PT goals: Progressing toward goals    Frequency    7X/week      PT Plan Current plan remains appropriate    Co-evaluation             End of Session Equipment Utilized During Treatment: Gait belt Activity Tolerance: Patient tolerated treatment well Patient left: in chair;with call bell/phone within reach;with  nursing/sitter in room     Time: 0835-0858 PT Time Calculation (min) (ACUTE ONLY): 23 min  Charges:  $Gait Training: 8-22 mins $Therapeutic Exercise: 8-22 mins                    G Codes:      Colin BroachSabra M. Deloris Moger PT, DPT  [phone]  01/24/2017, 9:03 AM

## 2017-01-25 DIAGNOSIS — I10 Essential (primary) hypertension: Secondary | ICD-10-CM | POA: Diagnosis not present

## 2017-01-25 DIAGNOSIS — D649 Anemia, unspecified: Secondary | ICD-10-CM | POA: Diagnosis not present

## 2017-01-25 DIAGNOSIS — R7989 Other specified abnormal findings of blood chemistry: Secondary | ICD-10-CM | POA: Diagnosis not present

## 2017-01-25 LAB — TYPE AND SCREEN
ABO/RH(D): O POS
Antibody Screen: NEGATIVE
UNIT DIVISION: 0
Unit division: 0
Unit division: 0
Unit division: 0

## 2017-01-26 DIAGNOSIS — Z471 Aftercare following joint replacement surgery: Secondary | ICD-10-CM | POA: Diagnosis not present

## 2017-01-26 DIAGNOSIS — Z96651 Presence of right artificial knee joint: Secondary | ICD-10-CM | POA: Diagnosis not present

## 2017-01-26 DIAGNOSIS — I130 Hypertensive heart and chronic kidney disease with heart failure and stage 1 through stage 4 chronic kidney disease, or unspecified chronic kidney disease: Secondary | ICD-10-CM | POA: Diagnosis not present

## 2017-01-27 DIAGNOSIS — I130 Hypertensive heart and chronic kidney disease with heart failure and stage 1 through stage 4 chronic kidney disease, or unspecified chronic kidney disease: Secondary | ICD-10-CM | POA: Diagnosis not present

## 2017-01-27 DIAGNOSIS — Z471 Aftercare following joint replacement surgery: Secondary | ICD-10-CM | POA: Diagnosis not present

## 2017-01-27 DIAGNOSIS — Z96651 Presence of right artificial knee joint: Secondary | ICD-10-CM | POA: Diagnosis not present

## 2017-02-01 DIAGNOSIS — Z96651 Presence of right artificial knee joint: Secondary | ICD-10-CM | POA: Diagnosis not present

## 2017-02-01 DIAGNOSIS — I130 Hypertensive heart and chronic kidney disease with heart failure and stage 1 through stage 4 chronic kidney disease, or unspecified chronic kidney disease: Secondary | ICD-10-CM | POA: Diagnosis not present

## 2017-02-01 DIAGNOSIS — Z471 Aftercare following joint replacement surgery: Secondary | ICD-10-CM | POA: Diagnosis not present

## 2017-02-03 DIAGNOSIS — Z96651 Presence of right artificial knee joint: Secondary | ICD-10-CM | POA: Diagnosis not present

## 2017-02-03 DIAGNOSIS — Z471 Aftercare following joint replacement surgery: Secondary | ICD-10-CM | POA: Diagnosis not present

## 2017-02-03 DIAGNOSIS — I130 Hypertensive heart and chronic kidney disease with heart failure and stage 1 through stage 4 chronic kidney disease, or unspecified chronic kidney disease: Secondary | ICD-10-CM | POA: Diagnosis not present

## 2017-02-11 DIAGNOSIS — M25661 Stiffness of right knee, not elsewhere classified: Secondary | ICD-10-CM | POA: Diagnosis not present

## 2017-02-15 DIAGNOSIS — M25661 Stiffness of right knee, not elsewhere classified: Secondary | ICD-10-CM | POA: Diagnosis not present

## 2017-02-16 DIAGNOSIS — I10 Essential (primary) hypertension: Secondary | ICD-10-CM | POA: Diagnosis not present

## 2017-02-16 DIAGNOSIS — R7309 Other abnormal glucose: Secondary | ICD-10-CM | POA: Diagnosis not present

## 2017-02-17 DIAGNOSIS — M25661 Stiffness of right knee, not elsewhere classified: Secondary | ICD-10-CM | POA: Diagnosis not present

## 2017-02-19 DIAGNOSIS — M25661 Stiffness of right knee, not elsewhere classified: Secondary | ICD-10-CM | POA: Diagnosis not present

## 2017-02-22 DIAGNOSIS — M25661 Stiffness of right knee, not elsewhere classified: Secondary | ICD-10-CM | POA: Diagnosis not present

## 2017-02-24 DIAGNOSIS — M25661 Stiffness of right knee, not elsewhere classified: Secondary | ICD-10-CM | POA: Diagnosis not present

## 2017-02-26 DIAGNOSIS — M25661 Stiffness of right knee, not elsewhere classified: Secondary | ICD-10-CM | POA: Diagnosis not present

## 2017-03-03 DIAGNOSIS — Z471 Aftercare following joint replacement surgery: Secondary | ICD-10-CM | POA: Diagnosis not present

## 2017-03-03 DIAGNOSIS — Z96651 Presence of right artificial knee joint: Secondary | ICD-10-CM | POA: Diagnosis not present

## 2017-03-03 DIAGNOSIS — M25661 Stiffness of right knee, not elsewhere classified: Secondary | ICD-10-CM | POA: Diagnosis not present

## 2017-03-05 DIAGNOSIS — M25661 Stiffness of right knee, not elsewhere classified: Secondary | ICD-10-CM | POA: Diagnosis not present

## 2017-03-10 DIAGNOSIS — M25661 Stiffness of right knee, not elsewhere classified: Secondary | ICD-10-CM | POA: Diagnosis not present

## 2017-03-24 DIAGNOSIS — M25661 Stiffness of right knee, not elsewhere classified: Secondary | ICD-10-CM | POA: Diagnosis not present

## 2017-03-25 DIAGNOSIS — M9905 Segmental and somatic dysfunction of pelvic region: Secondary | ICD-10-CM | POA: Diagnosis not present

## 2017-03-25 DIAGNOSIS — M9903 Segmental and somatic dysfunction of lumbar region: Secondary | ICD-10-CM | POA: Diagnosis not present

## 2017-03-25 DIAGNOSIS — M5386 Other specified dorsopathies, lumbar region: Secondary | ICD-10-CM | POA: Diagnosis not present

## 2017-03-26 DIAGNOSIS — M25661 Stiffness of right knee, not elsewhere classified: Secondary | ICD-10-CM | POA: Diagnosis not present

## 2017-03-27 DIAGNOSIS — M9905 Segmental and somatic dysfunction of pelvic region: Secondary | ICD-10-CM | POA: Diagnosis not present

## 2017-03-27 DIAGNOSIS — M9903 Segmental and somatic dysfunction of lumbar region: Secondary | ICD-10-CM | POA: Diagnosis not present

## 2017-03-27 DIAGNOSIS — M5386 Other specified dorsopathies, lumbar region: Secondary | ICD-10-CM | POA: Diagnosis not present

## 2017-03-30 DIAGNOSIS — M9903 Segmental and somatic dysfunction of lumbar region: Secondary | ICD-10-CM | POA: Diagnosis not present

## 2017-03-30 DIAGNOSIS — M9905 Segmental and somatic dysfunction of pelvic region: Secondary | ICD-10-CM | POA: Diagnosis not present

## 2017-03-30 DIAGNOSIS — M5386 Other specified dorsopathies, lumbar region: Secondary | ICD-10-CM | POA: Diagnosis not present

## 2017-04-07 DIAGNOSIS — M5386 Other specified dorsopathies, lumbar region: Secondary | ICD-10-CM | POA: Diagnosis not present

## 2017-04-07 DIAGNOSIS — M9905 Segmental and somatic dysfunction of pelvic region: Secondary | ICD-10-CM | POA: Diagnosis not present

## 2017-04-07 DIAGNOSIS — M9903 Segmental and somatic dysfunction of lumbar region: Secondary | ICD-10-CM | POA: Diagnosis not present

## 2017-04-12 DIAGNOSIS — M9905 Segmental and somatic dysfunction of pelvic region: Secondary | ICD-10-CM | POA: Diagnosis not present

## 2017-04-12 DIAGNOSIS — M5386 Other specified dorsopathies, lumbar region: Secondary | ICD-10-CM | POA: Diagnosis not present

## 2017-04-12 DIAGNOSIS — M9903 Segmental and somatic dysfunction of lumbar region: Secondary | ICD-10-CM | POA: Diagnosis not present

## 2017-04-14 DIAGNOSIS — M5386 Other specified dorsopathies, lumbar region: Secondary | ICD-10-CM | POA: Diagnosis not present

## 2017-04-14 DIAGNOSIS — M9903 Segmental and somatic dysfunction of lumbar region: Secondary | ICD-10-CM | POA: Diagnosis not present

## 2017-04-14 DIAGNOSIS — M9905 Segmental and somatic dysfunction of pelvic region: Secondary | ICD-10-CM | POA: Diagnosis not present

## 2017-04-23 DIAGNOSIS — M9905 Segmental and somatic dysfunction of pelvic region: Secondary | ICD-10-CM | POA: Diagnosis not present

## 2017-04-23 DIAGNOSIS — M9903 Segmental and somatic dysfunction of lumbar region: Secondary | ICD-10-CM | POA: Diagnosis not present

## 2017-04-23 DIAGNOSIS — M5386 Other specified dorsopathies, lumbar region: Secondary | ICD-10-CM | POA: Diagnosis not present

## 2017-05-07 DIAGNOSIS — M9905 Segmental and somatic dysfunction of pelvic region: Secondary | ICD-10-CM | POA: Diagnosis not present

## 2017-05-07 DIAGNOSIS — M9903 Segmental and somatic dysfunction of lumbar region: Secondary | ICD-10-CM | POA: Diagnosis not present

## 2017-05-07 DIAGNOSIS — M5386 Other specified dorsopathies, lumbar region: Secondary | ICD-10-CM | POA: Diagnosis not present

## 2017-05-21 DIAGNOSIS — M9905 Segmental and somatic dysfunction of pelvic region: Secondary | ICD-10-CM | POA: Diagnosis not present

## 2017-05-21 DIAGNOSIS — M9903 Segmental and somatic dysfunction of lumbar region: Secondary | ICD-10-CM | POA: Diagnosis not present

## 2017-05-21 DIAGNOSIS — M5386 Other specified dorsopathies, lumbar region: Secondary | ICD-10-CM | POA: Diagnosis not present

## 2017-06-18 DIAGNOSIS — M5386 Other specified dorsopathies, lumbar region: Secondary | ICD-10-CM | POA: Diagnosis not present

## 2017-06-18 DIAGNOSIS — M9905 Segmental and somatic dysfunction of pelvic region: Secondary | ICD-10-CM | POA: Diagnosis not present

## 2017-06-18 DIAGNOSIS — M9903 Segmental and somatic dysfunction of lumbar region: Secondary | ICD-10-CM | POA: Diagnosis not present

## 2017-09-10 DIAGNOSIS — M9903 Segmental and somatic dysfunction of lumbar region: Secondary | ICD-10-CM | POA: Diagnosis not present

## 2017-09-10 DIAGNOSIS — M5386 Other specified dorsopathies, lumbar region: Secondary | ICD-10-CM | POA: Diagnosis not present

## 2017-09-10 DIAGNOSIS — M9905 Segmental and somatic dysfunction of pelvic region: Secondary | ICD-10-CM | POA: Diagnosis not present

## 2017-12-11 ENCOUNTER — Emergency Department (HOSPITAL_COMMUNITY): Payer: 59

## 2017-12-11 ENCOUNTER — Inpatient Hospital Stay (HOSPITAL_COMMUNITY)
Admission: EM | Admit: 2017-12-11 | Discharge: 2017-12-20 | DRG: 481 | Disposition: A | Discharge status: Home Health | Payer: 59 | Attending: Internal Medicine | Admitting: Internal Medicine

## 2017-12-11 ENCOUNTER — Other Ambulatory Visit: Payer: Self-pay

## 2017-12-11 ENCOUNTER — Encounter (HOSPITAL_COMMUNITY): Payer: Self-pay

## 2017-12-11 DIAGNOSIS — M130 Polyarthritis, unspecified: Secondary | ICD-10-CM | POA: Diagnosis not present

## 2017-12-11 DIAGNOSIS — Z09 Encounter for follow-up examination after completed treatment for conditions other than malignant neoplasm: Secondary | ICD-10-CM

## 2017-12-11 DIAGNOSIS — I5022 Chronic systolic (congestive) heart failure: Secondary | ICD-10-CM | POA: Diagnosis not present

## 2017-12-11 DIAGNOSIS — R509 Fever, unspecified: Secondary | ICD-10-CM | POA: Diagnosis not present

## 2017-12-11 DIAGNOSIS — W1830XA Fall on same level, unspecified, initial encounter: Secondary | ICD-10-CM | POA: Diagnosis present

## 2017-12-11 DIAGNOSIS — R609 Edema, unspecified: Secondary | ICD-10-CM | POA: Diagnosis not present

## 2017-12-11 DIAGNOSIS — Z6841 Body Mass Index (BMI) 40.0 and over, adult: Secondary | ICD-10-CM | POA: Diagnosis not present

## 2017-12-11 DIAGNOSIS — S72401A Unspecified fracture of lower end of right femur, initial encounter for closed fracture: Principal | ICD-10-CM | POA: Diagnosis present

## 2017-12-11 DIAGNOSIS — S72391A Other fracture of shaft of right femur, initial encounter for closed fracture: Secondary | ICD-10-CM | POA: Diagnosis not present

## 2017-12-11 DIAGNOSIS — E860 Dehydration: Secondary | ICD-10-CM | POA: Diagnosis not present

## 2017-12-11 DIAGNOSIS — T148XXA Other injury of unspecified body region, initial encounter: Secondary | ICD-10-CM | POA: Diagnosis not present

## 2017-12-11 DIAGNOSIS — I70209 Unspecified atherosclerosis of native arteries of extremities, unspecified extremity: Secondary | ICD-10-CM | POA: Diagnosis present

## 2017-12-11 DIAGNOSIS — M25531 Pain in right wrist: Secondary | ICD-10-CM

## 2017-12-11 DIAGNOSIS — I82441 Acute embolism and thrombosis of right tibial vein: Secondary | ICD-10-CM | POA: Diagnosis not present

## 2017-12-11 DIAGNOSIS — M84351A Stress fracture, right femur, initial encounter for fracture: Secondary | ICD-10-CM

## 2017-12-11 DIAGNOSIS — Z880 Allergy status to penicillin: Secondary | ICD-10-CM

## 2017-12-11 DIAGNOSIS — M25061 Hemarthrosis, right knee: Secondary | ICD-10-CM | POA: Diagnosis present

## 2017-12-11 DIAGNOSIS — Z419 Encounter for procedure for purposes other than remedying health state, unspecified: Secondary | ICD-10-CM

## 2017-12-11 DIAGNOSIS — D649 Anemia, unspecified: Secondary | ICD-10-CM | POA: Diagnosis present

## 2017-12-11 DIAGNOSIS — S728X1A Other fracture of right femur, initial encounter for closed fracture: Secondary | ICD-10-CM | POA: Diagnosis not present

## 2017-12-11 DIAGNOSIS — I82431 Acute embolism and thrombosis of right popliteal vein: Secondary | ICD-10-CM | POA: Diagnosis not present

## 2017-12-11 DIAGNOSIS — Z96651 Presence of right artificial knee joint: Secondary | ICD-10-CM | POA: Diagnosis present

## 2017-12-11 DIAGNOSIS — I10 Essential (primary) hypertension: Secondary | ICD-10-CM | POA: Diagnosis not present

## 2017-12-11 DIAGNOSIS — M1711 Unilateral primary osteoarthritis, right knee: Secondary | ICD-10-CM

## 2017-12-11 DIAGNOSIS — M9711XA Periprosthetic fracture around internal prosthetic right knee joint, initial encounter: Secondary | ICD-10-CM | POA: Diagnosis present

## 2017-12-11 DIAGNOSIS — Z888 Allergy status to other drugs, medicaments and biological substances status: Secondary | ICD-10-CM

## 2017-12-11 DIAGNOSIS — Z833 Family history of diabetes mellitus: Secondary | ICD-10-CM

## 2017-12-11 DIAGNOSIS — Z8249 Family history of ischemic heart disease and other diseases of the circulatory system: Secondary | ICD-10-CM

## 2017-12-11 DIAGNOSIS — I82491 Acute embolism and thrombosis of other specified deep vein of right lower extremity: Secondary | ICD-10-CM | POA: Diagnosis not present

## 2017-12-11 DIAGNOSIS — Z96642 Presence of left artificial hip joint: Secondary | ICD-10-CM | POA: Diagnosis present

## 2017-12-11 DIAGNOSIS — M25561 Pain in right knee: Secondary | ICD-10-CM | POA: Diagnosis not present

## 2017-12-11 DIAGNOSIS — M109 Gout, unspecified: Secondary | ICD-10-CM | POA: Diagnosis present

## 2017-12-11 DIAGNOSIS — I951 Orthostatic hypotension: Secondary | ICD-10-CM | POA: Diagnosis present

## 2017-12-11 DIAGNOSIS — I824Z1 Acute embolism and thrombosis of unspecified deep veins of right distal lower extremity: Secondary | ICD-10-CM | POA: Diagnosis not present

## 2017-12-11 DIAGNOSIS — W010XXA Fall on same level from slipping, tripping and stumbling without subsequent striking against object, initial encounter: Secondary | ICD-10-CM | POA: Diagnosis not present

## 2017-12-11 DIAGNOSIS — I110 Hypertensive heart disease with heart failure: Secondary | ICD-10-CM | POA: Diagnosis present

## 2017-12-11 DIAGNOSIS — I5032 Chronic diastolic (congestive) heart failure: Secondary | ICD-10-CM | POA: Diagnosis not present

## 2017-12-11 DIAGNOSIS — I82401 Acute embolism and thrombosis of unspecified deep veins of right lower extremity: Secondary | ICD-10-CM | POA: Diagnosis not present

## 2017-12-11 LAB — BASIC METABOLIC PANEL
ANION GAP: 13 (ref 5–15)
BUN: 21 mg/dL — ABNORMAL HIGH (ref 6–20)
CALCIUM: 9 mg/dL (ref 8.9–10.3)
CO2: 21 mmol/L — ABNORMAL LOW (ref 22–32)
Chloride: 104 mmol/L (ref 101–111)
Creatinine, Ser: 1.12 mg/dL (ref 0.61–1.24)
Glucose, Bld: 120 mg/dL — ABNORMAL HIGH (ref 65–99)
POTASSIUM: 3.9 mmol/L (ref 3.5–5.1)
Sodium: 138 mmol/L (ref 135–145)

## 2017-12-11 LAB — CBC WITH DIFFERENTIAL/PLATELET
BASOS ABS: 0 10*3/uL (ref 0.0–0.1)
BASOS PCT: 1 %
EOS PCT: 2 %
Eosinophils Absolute: 0.1 10*3/uL (ref 0.0–0.7)
HCT: 39.5 % (ref 39.0–52.0)
Hemoglobin: 13.8 g/dL (ref 13.0–17.0)
LYMPHS PCT: 26 %
Lymphs Abs: 1.6 10*3/uL (ref 0.7–4.0)
MCH: 33.9 pg (ref 26.0–34.0)
MCHC: 34.9 g/dL (ref 30.0–36.0)
MCV: 97.1 fL (ref 78.0–100.0)
MONO ABS: 0.5 10*3/uL (ref 0.1–1.0)
Monocytes Relative: 8 %
NEUTROS ABS: 4 10*3/uL (ref 1.7–7.7)
Neutrophils Relative %: 63 %
PLATELETS: 274 10*3/uL (ref 150–400)
RBC: 4.07 MIL/uL — AB (ref 4.22–5.81)
RDW: 14.2 % (ref 11.5–15.5)
WBC: 6.2 10*3/uL (ref 4.0–10.5)

## 2017-12-11 LAB — MAGNESIUM: MAGNESIUM: 1.8 mg/dL (ref 1.7–2.4)

## 2017-12-11 MED ORDER — HYDRALAZINE HCL 25 MG PO TABS
25.0000 mg | ORAL_TABLET | Freq: Two times a day (BID) | ORAL | Status: DC
  Administered 2017-12-12 – 2017-12-14 (×3): 25 mg via ORAL
  Filled 2017-12-11 (×3): qty 1

## 2017-12-11 MED ORDER — HYDROMORPHONE HCL 1 MG/ML IJ SOLN
1.0000 mg | INTRAMUSCULAR | Status: DC | PRN
  Administered 2017-12-11 – 2017-12-20 (×57): 1 mg via INTRAVENOUS
  Filled 2017-12-11 (×58): qty 1

## 2017-12-11 MED ORDER — AMLODIPINE BESYLATE 10 MG PO TABS
10.0000 mg | ORAL_TABLET | Freq: Every day | ORAL | Status: DC
  Administered 2017-12-12 – 2017-12-20 (×8): 10 mg via ORAL
  Filled 2017-12-11 (×8): qty 1

## 2017-12-11 MED ORDER — LIDOCAINE 5 % EX PTCH
1.0000 | MEDICATED_PATCH | CUTANEOUS | Status: DC
  Administered 2017-12-12 – 2017-12-20 (×5): 1 via TRANSDERMAL
  Filled 2017-12-11 (×11): qty 1

## 2017-12-11 MED ORDER — HYDROMORPHONE BOLUS VIA INFUSION: 1.0000 mg | INTRAVENOUS | Status: DC | PRN

## 2017-12-11 MED ORDER — HYDRALAZINE HCL 20 MG/ML IJ SOLN
10.0000 mg | Freq: Four times a day (QID) | INTRAMUSCULAR | Status: AC | PRN
  Administered 2017-12-11 (×2): 10 mg via INTRAVENOUS
  Filled 2017-12-11 (×3): qty 1

## 2017-12-11 MED ORDER — SENNA 8.6 MG PO TABS
2.0000 | ORAL_TABLET | Freq: Every day | ORAL | Status: DC
  Administered 2017-12-11 – 2017-12-17 (×6): 17.2 mg via ORAL
  Filled 2017-12-11 (×6): qty 2

## 2017-12-11 MED ORDER — CARVEDILOL 25 MG PO TABS
25.0000 mg | ORAL_TABLET | Freq: Two times a day (BID) | ORAL | Status: DC
  Administered 2017-12-11 – 2017-12-20 (×16): 25 mg via ORAL
  Filled 2017-12-11 (×16): qty 1

## 2017-12-11 NOTE — ED Provider Notes (Signed)
[facility]-EMERGENCY DEPT Provider Note   CSN: [id number] Arrival date & time: 12/11/17  1000     History   Chief Complaint Chief Complaint  Patient presents with  . Knee Pain    HPI Mendel RyderJames II Deland is a 46 y.o. male. Chief complaint is fall, right leg pain  HPI: A 46 year old male. He is [place] [employer] [employer]. He has history of hypertension and CHF. Also history of osteoarthritis status post right total knee arthroplasty by Dr. Gwen HerBryan Swintek January of this year.  Resort today. He states that he and a coworker were "laughing hysterically". He literally states that he had his hands on his stomach and was "belly laughing".  He felt lightheaded. He had a near syncopal 2. He fell backwards. Struck his head against a copy or. He states he might be out for "a second or 2. He states he was instantly aware of what it happened but his right knee is very painful he's not been even able to attempt put weight on it is swollen and deformed  Past Medical History:  Diagnosis Date  . Arthritis    OSTEO  . CHF (congestive heart failure) (HCC)    Echo- 2014- EF 40-45%; mild LVH.Takes Lasix daily  . History of bronchitis    3 yrs ago  . History of gout    only once   . Hypertension    takes Coreg and Losartan daily  . Joint pain   . White coat hypertension     Patient Active Problem List   Diagnosis Date Noted  . Other fracture of right femur, initial encounter for closed fracture (HCC) 12/11/2017  . Stress fracture, right femur, initial encounter for fracture 12/11/2017  . Acute blood loss anemia 01/21/2017  . Acute on chronic renal failure (HCC) 01/21/2017  . Osteoarthritis of right knee 01/18/2017  . Essential hypertension, benign 08/08/2014  . Obesity, unspecified 08/08/2014  . Congestive heart failure (HCC) 08/08/2014    Past Surgical History:  Procedure Laterality Date  . KNEE ARTHROPLASTY Right 01/18/2017   Procedure: RIGHT TOTAL KNEE  ARTHROPLASTY WITH COMPUTER NAVIGATION;  Surgeon: Samson FredericBrian Swinteck, MD;  Location: [facility] OR;  Service: Orthopedics;  Laterality: Right;  . TOTAL HIP ARTHROPLASTY Left 08/31/2016   Procedure: LEFT TOTAL HIP ARTHROPLASTY ANTERIOR APPROACH;  Surgeon: Samson FredericBrian Swinteck, MD;  Location: [facility] OR;  Service: Orthopedics;  Laterality: Left;  Requesting RNFA  . TOTAL KNEE ARTHROPLASTY Right 01/18/2017  . TRICEPS TENDON REPAIR Bilateral        Home Medications    Prior to Admission medications   Medication Sig Start Date End Date Taking? Authorizing Provider  amLODipine (NORVASC) 10 MG tablet Take 10 mg by mouth daily. 11/30/17  Yes [provider]  carvedilol (COREG) 25 MG tablet Take 25 mg by mouth 2 (two) times daily. 12/14/16  Yes [provider]  furosemide (LASIX) 40 MG tablet Take 20 mg by mouth daily.    Yes [provider]  hydrALAZINE (APRESOLINE) 25 MG tablet Take 25 mg by mouth 2 (two) times daily with a meal. 11/30/17  Yes [provider]  ibuprofen (ADVIL,MOTRIN) 200 MG tablet Take 600-800 mg by mouth every 6 (six) hours as needed for mild pain.   Yes [provider]  losartan (COZAAR) 100 MG tablet Take 100 mg by mouth daily.   Yes [provider]  potassium chloride (K-DUR,KLOR-CON) 10 MEQ tablet Take 10 mEq by mouth daily.   Yes [provider]  aspirin 81 MG chewable tablet Chew 1 tablet (81 mg total) by mouth 2 (two) times daily. Patient not taking: Reported on 12/11/2017 01/20/17   Samson Frederic, MD  docusate sodium (COLACE) 100 MG capsule Take 1 capsule (100 mg total) by mouth 2 (two) times daily. Patient not taking: Reported on 12/11/2017 01/20/17   Samson Frederic, MD  HYDROcodone-acetaminophen (NORCO/VICODIN) 5-325 MG tablet Take 1-2 tablets by mouth every 4 (four) hours as needed (breakthrough pain). Patient not taking: Reported on 12/11/2017 01/20/17   Samson Frederic, MD  methocarbamol (ROBAXIN) 500 MG tablet Take 1 tablet  (500 mg total) by mouth every 6 (six) hours as needed for muscle spasms. Patient not taking: Reported on 12/11/2017 01/20/17   Samson Frederic, MD  ondansetron (ZOFRAN) 4 MG tablet Take 1 tablet (4 mg total) by mouth every 6 (six) hours as needed for nausea. Patient not taking: Reported on 12/11/2017 01/20/17   Samson Frederic, MD  oxyCODONE (OXYCONTIN) 10 mg 12 hr tablet Take 1 tablet (10 mg total) by mouth PRO. 1 tab PO every 12 hours for 3 days, then 1 tab PO daily for 4 days Patient not taking: Reported on 12/11/2017 01/20/17   Samson Frederic, MD  senna (SENOKOT) 8.6 MG TABS tablet Take 2 tablets (17.2 mg total) by mouth at bedtime. Patient not taking: Reported on 12/11/2017 01/20/17   Samson Frederic, MD    Family History Family History  Problem Relation Age of Onset  . Other Father        MVA  . Hypertension Paternal Grandfather   . Other Paternal Grandfather        cardiac arrest  . Diabetes Paternal Grandfather   . Heart attack Paternal Grandfather     Social History Social History   Tobacco Use  . Smoking status: Never Smoker  . Smokeless tobacco: Never Used  Substance Use Topics  . Alcohol use: No  . Drug use: No     Allergies   Lisinopril and Penicillins   Review of Systems Review of Systems  Constitutional: Negative for appetite change, chills, diaphoresis, fatigue and fever.  HENT: Negative for mouth sores, sore throat and trouble swallowing.   Eyes: Negative for visual disturbance.  Respiratory: Negative for cough, chest tightness, shortness of breath and wheezing.   Cardiovascular: Negative for chest pain.  Gastrointestinal: Negative for abdominal distention, abdominal pain, diarrhea, nausea and vomiting.  Endocrine: Negative for polydipsia, polyphagia and polyuria.  Genitourinary: Negative for dysuria, frequency and hematuria.  Musculoskeletal: Positive for arthralgias and joint swelling. Negative for gait problem.  Skin: Negative for color change, pallor  and rash.  Neurological: Positive for syncope. Negative for dizziness, light-headedness and headaches.  Hematological: Does not bruise/bleed easily.  Psychiatric/Behavioral: Negative for behavioral problems and confusion.     Physical Exam Updated Vital Signs BP (!) 203/110   Pulse 85   Temp 98.1 F (36.7 C) (Oral)   Resp 16   Ht 6\' 2"  (1.88 m)   Wt (!) 149.7 kg (330 lb)   SpO2 100%   BMI 42.37 kg/m   Physical Exam  Constitutional: He is oriented to person, place, and time. He appears well-developed and well-nourished. No distress.  HENT:  Head: Normocephalic.  Eyes: Conjunctivae are normal. Pupils are equal, round, and reactive to light. No scleral icterus.  Neck: Normal range of motion. Neck supple. No thyromegaly present.  Cardiovascular: Normal rate and regular rhythm. Exam reveals no gallop and no friction rub.  No murmur heard. Pulmonary/Chest: Effort normal and  breath sounds normal. No respiratory distress. He has no wheezes. He has no rales.  Abdominal: Soft. Bowel sounds are normal. He exhibits no distension. There is no tenderness. There is no rebound.  Musculoskeletal: Normal range of motion.  Soft tissue swelling deformity pain and tenderness about the right knee.  Neurological: He is alert and oriented to person, place, and time.  Skin: Skin is warm and dry. No rash noted.  Psychiatric: He has a normal mood and affect. His behavior is normal.     ED Treatments / Results  Labs (all labs ordered are listed, but only abnormal results are displayed) Labs Reviewed  CBC WITH DIFFERENTIAL/PLATELET - Abnormal; Notable for the following components:      Result Value   RBC 4.07 (*)    All other components within normal limits  BASIC METABOLIC PANEL - Abnormal; Notable for the following components:   CO2 21 (*)    Glucose, Bld 120 (*)    BUN 21 (*)    All other components within normal limits  MAGNESIUM    EKG  EKG Interpretation None       Radiology Ct  Knee Right Wo Contrast  Result Date: 12/11/2017 CLINICAL DATA:  46 year old male with right knee periprosthetic fracture EXAM: CT OF THE RIGHT KNEE WITHOUT CONTRAST TECHNIQUE: Multidetector CT imaging of the RIGHT knee was performed according to the standard protocol. Multiplanar CT image reconstructions were also generated. COMPARISON:  Concurrently obtained radiographs 12/11/2017 FINDINGS: Comminuted and posteromedially displaced and tilted fracture through the distal femur just above the femoral condyles in the anterior aspect of the femoral components of the knee prosthesis. The distal fracture fragment is displaced by approximately 2.6 cm. The patella and tibia remain aligned with the femoral condyles. Moderate associated hemarthrosis and extensive surrounding soft tissue swelling. Streak artifact from the arthroplasty prosthesis slightly limits evaluation of fine osseous detail. No definite evidence of periprosthetic fracture along the tibial component. The visualized fibula is within normal limits. Atherosclerotic calcifications are present in the runoff arteries. IMPRESSION: 1. Comminuted and posteromedially displaced distal femoral periprosthetic fracture. Maximal displacement measures approximately 2.6 cm. 2. The patella and tibia remain aligned with the femoral condyles. 3. Moderate associated hemarthrosis and surrounding soft tissue swelling. 4. Atherosclerotic vascular calcifications. Electronically Signed   By: Malachy MoanHeath  McCullough M.D.   On: 12/11/2017 14:47   Dg Knee Complete 4 Views Right  Result Date: 12/11/2017 CLINICAL DATA:  Felt a right knee with generalized right knee pain. Knee replacement performed on 01/18/2017. EXAM: RIGHT KNEE - COMPLETE 4+ VIEW COMPARISON:  01/18/2017 FINDINGS: There is a comminuted fracture of the distal femoral metaphysis just above the femoral component of the right knee prosthesis. Primary fracture is oblique transverse in orientation. The distal fracture  component has displaced posteriorly by 2 cm. No other fractures. The knee prosthetic components remain well-seated and aligned. There is a joint effusion distending the suprapatellar joint capsule. Surrounding soft tissue swelling is noted. IMPRESSION: 1. Comminuted fracture of the distal right femur just above the femoral component of the right knee prosthesis. Distal fracture component is displaced posteriorly by 2 cm. 2. No other fractures.  No dislocation. Electronically Signed   By: Amie Portlandavid  Ormond M.D.   On: 12/11/2017 11:29   Dg Femur Min 2 Views Right  Result Date: 12/11/2017 CLINICAL DATA:  The patient suffered a periprosthetic distal right femur fracture in a fall today. Initial encounter. EXAM: RIGHT FEMUR 2 VIEWS COMPARISON:  Plain films right knee this same day.  FINDINGS: Periprosthetic femur fracture is again identified. No other acute bony or joint abnormality is seen. Mild right hip osteoarthritis noted. IMPRESSION: Periprosthetic distal right femur fracture as seen on the comparison examination. No other acute abnormality. Electronically Signed   By: Drusilla Kanner M.D.   On: 12/11/2017 14:47    Procedures Procedures (including critical care time)  Medications Ordered in ED Medications  lidocaine (LIDODERM) 5 % 1 patch (not administered)  HYDROmorphone (DILAUDID) bolus via infusion 1 mg (not administered)     Initial Impression / Assessment and Plan / ED Course  I have reviewed the triage vital signs and the nursing notes.  Pertinent labs & imaging results that were available during my care of the patient were reviewed by me and considered in my medical decision making (see chart for details).    Very likely simple left syncope. EKG shows normal intervals. Sinus rhythm. Normal CBC normal basic. He is asymptomatic without chest pain shortness of breath lightheadedness or weakness. X-ray shows periprosthetic right distal femur fracture. Care discussed with Dr. Venancio Poisson tach  through his staff. He is in the operating room. He will set the patient in transfer to [facility] [address] [facility]. CT of the knee and distal femur obtained. Rest with hospitalist. They will arrange transfer.  Final Clinical Impressions(s) / ED Diagnoses   Final diagnoses:  Other closed fracture of right femur, unspecified portion of femur, initial encounter [facility])    ED Discharge Orders    None       Rolland Porter, MD 12/11/17 1502

## 2017-12-11 NOTE — Progress Notes (Signed)
RN spoke with Dr. Bruna PotterBlount concerning patients diet and order set. The doctor advised that she will update order set. [facility] Nursing will continue to monitor.

## 2017-12-11 NOTE — Progress Notes (Signed)
Patient arrives at this time to room 523 via EMS

## 2017-12-11 NOTE — H&P (Signed)
History and Physical  Andre RyderJames II Overdorf AVW:[id number] DOB: [birth year] DOA: 12/11/2017  Referring physician: Dr Fayrene FearingJames PCP: Georgann HousekeeperHusain, Karrar, MD  Outpatient Specialists: orthopedic surgery Patient coming from: Home  Chief Complaint: Fall and right knee pain  HPI: Andre Hoffman is a 46 y.o. 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 Patient is a Emergency planning/management officerpolice officer and reports that he was at a gathering today. Laughing so hard and next thing he knows he found himself on the floor. Denies loss of conciousness, chest pain, or palpitations. States he was in his usual state of health prior to the fall.  ED Course: xray of right knee 12/11/17 revealed comminuted fracture of the distal R femur just above the femoral component of right knee prosthesis. Accelerated HTN most likely due to his pain. IV dilaudid prn and lidocaine patch given. Orthopedic surgery contacted by ED physician and requested patient being transferred to [facility] for possible surgical intervention.  Review of Systems: review of systems as noted in the HPI otherwise negative.   Past Medical History:  Diagnosis Date  . Arthritis    OSTEO  . CHF (congestive heart failure) (HCC)    Echo- 2014- EF 40-45%; mild LVH.Takes Lasix daily  . History of bronchitis    3 yrs ago  . History of gout    only once   . Hypertension    takes Coreg and Losartan daily  . Joint pain   . White coat hypertension    Past Surgical History:  Procedure Laterality Date  . KNEE ARTHROPLASTY Right 01/18/2017   Procedure: RIGHT TOTAL KNEE ARTHROPLASTY WITH COMPUTER NAVIGATION;  Surgeon: Samson FredericBrian Swinteck, MD;  Location: [facility] OR;  Service: Orthopedics;  Laterality: Right;  . TOTAL HIP ARTHROPLASTY Left 08/31/2016   Procedure: LEFT TOTAL HIP ARTHROPLASTY ANTERIOR APPROACH;   Surgeon: Samson FredericBrian Swinteck, MD;  Location: [facility] OR;  Service: Orthopedics;  Laterality: Left;  Requesting RNFA  . TOTAL KNEE ARTHROPLASTY Right 01/18/2017  . TRICEPS TENDON REPAIR Bilateral     Social History:  reports that  has never smoked. he has never used smokeless tobacco. He reports that he does not drink alcohol or use drugs.   Allergies  Allergen Reactions  . Lisinopril Swelling    Angioedema  . Penicillins Swelling    SWELLING REACTION UNSPECIFIED as a child  Has patient had a PCN reaction causing immediate rash, facial/tongue/throat swelling, SOB or lightheadedness with hypotension: Unknown Has patient had a PCN reaction causing severe rash involving mucus membranes or skin necrosis: Unknown Has patient had a PCN reaction that required hospitalization: Unknown Has patient had a PCN reaction occurring within the last 10 years: No If all of the above answers are "NO", then may proceed with Cephalosporin use.      Family History  Problem Relation Age of Onset  . Other Father        MVA  . Hypertension Paternal Grandfather   . Other Paternal Grandfather        cardiac arrest  . Diabetes Paternal Grandfather   . Heart attack Paternal Grandfather     No family history of cardiac or neurological disorder including seizures.  Prior to Admission medications   Medication Sig Start Date End Date Taking? Authorizing Provider  amLODipine (NORVASC) 10 MG tablet Take 10 mg by mouth daily. 11/30/17  Yes [provider]  carvedilol (COREG) 25 MG tablet Take 25 mg by mouth 2 (two) times daily. 12/14/16  Yes [provider]  furosemide (LASIX) 40 MG tablet Take 20 mg by mouth daily.    Yes [provider]  hydrALAZINE (APRESOLINE) 25 MG tablet Take 25 mg by mouth 2 (two) times daily with a meal. 11/30/17  Yes [provider]  ibuprofen (ADVIL,MOTRIN) 200 MG tablet Take 600-800 mg by mouth every 6 (six) hours as needed for mild pain.   Yes [provider]  losartan (COZAAR) 100 MG tablet Take 100 mg by mouth daily.   Yes [provider]  potassium chloride (K-DUR,KLOR-CON) 10 MEQ tablet Take 10 mEq by mouth daily.   Yes [provider]  aspirin 81 MG chewable tablet Chew 1 tablet (81 mg total) by mouth 2 (two) times daily. Patient not taking: Reported on 12/11/2017 01/20/17   Samson Frederic, MD  docusate sodium (COLACE) 100 MG capsule Take 1 capsule (100 mg total) by mouth 2 (two) times daily. Patient not taking: Reported on 12/11/2017 01/20/17   Samson Frederic, MD  HYDROcodone-acetaminophen (NORCO/VICODIN) 5-325 MG tablet Take 1-2 tablets by mouth every 4 (four) hours as needed (breakthrough pain). Patient not taking: Reported on 12/11/2017 01/20/17   Samson Frederic, MD  methocarbamol (ROBAXIN) 500 MG tablet Take 1 tablet (500 mg total) by mouth every 6 (six) hours as needed for muscle spasms. Patient not taking: Reported on 12/11/2017 01/20/17   Samson Frederic, MD  ondansetron (ZOFRAN) 4 MG tablet Take 1 tablet (4 mg total) by mouth every 6 (six) hours as needed for nausea. Patient not taking: Reported on 12/11/2017 01/20/17   Samson Frederic, MD  oxyCODONE (OXYCONTIN) 10 mg 12 hr tablet Take 1 tablet (10 mg total) by mouth PRO. 1 tab PO every 12 hours for 3 days, then 1 tab PO daily for 4 days Patient not taking: Reported on 12/11/2017 01/20/17   Samson Frederic, MD  senna (SENOKOT) 8.6 MG TABS tablet Take 2 tablets (17.2 mg total) by mouth at bedtime. Patient not taking: Reported on 12/11/2017 01/20/17   Samson Frederic, MD    Physical Exam: BP (!) 210/107 (BP Location: Left Arm)   Pulse 79   Temp 98.1 F (36.7 C) (Oral)   Resp 13   Ht 6\' 2"  (1.88 m)   Wt (!) 149.7 kg (330 lb)   SpO2 100%   BMI 42.37 kg/m   General:  46 yo AAM WD WN appears uncomfortable due to severe pain in the right knee. Eyes: pupils are round and reactive to light. Anicteric scleare ENT: Mucous membranes moist. No erythema or  exudates  Neck: No JVD or thyromegaly  Cardiovascular: RRR no rubs or gallops  Respiratory: CTA no wheezes or rales  Abdomen: soft NT ND NBS x 4  Skin: No noted open wounds  Musculoskeletal: right leg appears shortened and externally rotated Psychiatric: Mood is apppropriate for condition and setting  Neurologic: alert and oriented x 4 Non focal deficit          Labs on Admission:  Basic Metabolic Panel: Recent Labs  Lab 12/11/17 1038  NA 138  K 3.9  CL 104  CO2 21*  GLUCOSE 120*  BUN 21*  CREATININE 1.12  CALCIUM 9.0  MG 1.8   Liver Function Tests: No results for input(s): AST, ALT, ALKPHOS, BILITOT, PROT, ALBUMIN in the last 168 hours. No results for input(s): LIPASE, AMYLASE in the last 168 hours. No  results for input(s): AMMONIA in the last 168 hours. CBC: Recent Labs  Lab 12/11/17 1038  WBC 6.2  NEUTROABS 4.0  HGB 13.8  HCT 39.5  MCV 97.1  PLT 274   Cardiac Enzymes: No results for input(s): CKTOTAL, CKMB, CKMBINDEX, TROPONINI in the last 168 hours.  BNP (last 3 results) No results for input(s): BNP in the last 8760 hours.  ProBNP (last 3 results) No results for input(s): PROBNP in the last 8760 hours.  CBG: No results for input(s): GLUCAP in the last 168 hours.  Radiological Exams on Admission: Dg Knee Complete 4 Views Right  Result Date: 12/11/2017 CLINICAL DATA:  Felt a right knee with generalized right knee pain. Knee replacement performed on 01/18/2017. EXAM: RIGHT KNEE - COMPLETE 4+ VIEW COMPARISON:  01/18/2017 FINDINGS: There is a comminuted fracture of the distal femoral metaphysis just above the femoral component of the right knee prosthesis. Primary fracture is oblique transverse in orientation. The distal fracture component has displaced posteriorly by 2 cm. No other fractures. The knee prosthetic components remain well-seated and aligned. There is a joint effusion distending the suprapatellar joint capsule. Surrounding soft tissue swelling is  noted. IMPRESSION: 1. Comminuted fracture of the distal right femur just above the femoral component of the right knee prosthesis. Distal fracture component is displaced posteriorly by 2 cm. 2. No other fractures.  No dislocation. Electronically Signed   By: Amie Portlandavid  Ormond M.D.   On: 12/11/2017 11:29    EKG: Independently reviewed. Sinus rhythm with rate 79  Assessment/Plan Present on Admission: . Other fracture of right femur, initial encounter for closed fracture [facility](HCC)  Active Problems:   Other fracture of right femur, initial encounter for closed fracture (HCC)  Acute comminuted fracture of the distal Right femur, poa -orthopedic surgery contacted -[facility] -pain management with lidocaine patch and IV dilaudid -CT knee 12/11/17 revealed 1. Comminuted and posteromedially displaced distal femoral periprosthetic fracture. Maximal displacement measures approximately 2.6 cm. 2. The patella and tibia remain aligned with the femoral condyles. 3. Moderate associated hemarthrosis and surrounding soft tissue swelling. 4. Atherosclerotic vascular calcifications. -PT/OT when more stable post surgery per ortho -bowel regimen with sennokot tablets  Accelerated HTN -on amlodipine, carvedilol and lasix at home -reported allergy to lisinopril -IV hydralazine 10 mg q6h prn for sbp>180 or dbp>105 -monitor vitals  Unclear presyncope -obtain orthosthatic BP when stable post surgery -avoid hypotension -hold lasix -Fall precaution    DVT prophylaxis: SCDs-Defer to orthopedic suirgery   Code Status: Full   Family Communication: Wife at bedside. All questions answered to her satisfaction   Disposition Plan: will stay another midnight for possible surgery in the am   Consults called: orthopedic surgery called by ED physician  Admission status: Inpatient status     Darlin Droparole N Zada Haser MD [facility] Pager [phone]  If 7PM-7AM, please contact  night-coverage [email or web address] Password [email or web address]  12/11/2017, 2:25 PM

## 2017-12-11 NOTE — ED Notes (Signed)
Bed: WA10 Expected date: 12/11/17 Expected time: 10:03 AM Means of arrival: Ambulance Comments: Knee pain

## 2017-12-11 NOTE — Consult Note (Signed)
ORTHOPAEDIC CONSULTATION  REQUESTING PHYSICIAN: Darlin DropHall, Carole N, DO  PCP:  Georgann HousekeeperHusain, Karrar, MD  Chief Complaint: Right lower extremity injury  HPI: Andre Hoffman is a 46 y.o. male who complains of right thigh pain and deformity.  He has a previous history of right total knee replacement done by myself in January 2017.  He has been doing very well.  He tripped and fell at work earlier today, injuring his right thigh.  He had pain and inability to weight-bear.  [facility], where x-rays revealed a periprosthetic distal femur fracture.  Orthopedic consultation was placed for management of the fracture.  Hospitalist was consulted for medical admission.  He denies other injuries.  Past Medical History:  Diagnosis Date  . Arthritis    OSTEO  . CHF (congestive heart failure) (HCC)    Echo- 2014- EF 40-45%; mild LVH.Takes Lasix daily  . History of bronchitis    3 yrs ago  . History of gout    only once   . Hypertension    takes Coreg and Losartan daily  . Joint pain   . White coat hypertension    Past Surgical History:  Procedure Laterality Date  . KNEE ARTHROPLASTY Right 01/18/2017   Procedure: RIGHT TOTAL KNEE ARTHROPLASTY WITH COMPUTER NAVIGATION;  Surgeon: Samson FredericBrian Emani Taussig, MD;  Location: [facility] OR;  Service: Orthopedics;  Laterality: Right;  . TOTAL HIP ARTHROPLASTY Left 08/31/2016   Procedure: LEFT TOTAL HIP ARTHROPLASTY ANTERIOR APPROACH;  Surgeon: Samson FredericBrian Aleighna Wojtas, MD;  Location: [facility] OR;  Service: Orthopedics;  Laterality: Left;  Requesting RNFA  . TOTAL KNEE ARTHROPLASTY Right 01/18/2017  . TRICEPS TENDON REPAIR Bilateral    Social History   Socioeconomic History  . Marital status: Married    Spouse name: None  . Number of children: None  . Years of education: None  . Highest education level: None  Social Needs  . Financial resource strain: None  . Food insecurity - worry: None  . Food insecurity - inability: None  .  Transportation needs - medical: None  . Transportation needs - non-medical: None  Occupational History  . None  Tobacco Use  . Smoking status: Never Smoker  . Smokeless tobacco: Never Used  Substance and Sexual Activity  . Alcohol use: No  . Drug use: No  . Sexual activity: None  Other Topics Concern  . None  Social History Narrative  . None   Family History  Problem Relation Age of Onset  . Other Father        MVA  . Hypertension Paternal Grandfather   . Other Paternal Grandfather        cardiac arrest  . Diabetes Paternal Grandfather   . Heart attack Paternal Grandfather    Allergies  Allergen Reactions  . Lisinopril Swelling    Angioedema  . Penicillins Swelling    SWELLING REACTION UNSPECIFIED as a child  Has patient had a PCN reaction causing immediate rash, facial/tongue/throat swelling, SOB or lightheadedness with hypotension: Unknown Has patient had a PCN reaction causing severe rash involving mucus membranes or skin necrosis: Unknown Has patient had a PCN reaction that required hospitalization: Unknown Has patient had a PCN reaction occurring within the last 10 years: No If all of the above answers are "NO", then may proceed with Cephalosporin use.     Prior to Admission medications   Medication Sig Start Date End Date Taking? Authorizing Provider  amLODipine (NORVASC) 10 MG tablet  Take 10 mg by mouth daily. 11/30/17  Yes [provider]  carvedilol (COREG) 25 MG tablet Take 25 mg by mouth 2 (two) times daily. 12/14/16  Yes [provider]  furosemide (LASIX) 40 MG tablet Take 20 mg by mouth daily.    Yes [provider]  hydrALAZINE (APRESOLINE) 25 MG tablet Take 25 mg by mouth 2 (two) times daily with a meal. 11/30/17  Yes [provider]  ibuprofen (ADVIL,MOTRIN) 200 MG tablet Take 600-800 mg by mouth every 6 (six) hours as needed for mild pain.   Yes [provider]  losartan (COZAAR) 100 MG tablet Take 100 mg  by mouth daily.   Yes [provider]  potassium chloride (K-DUR,KLOR-CON) 10 MEQ tablet Take 10 mEq by mouth daily.   Yes [provider]  aspirin 81 MG chewable tablet Chew 1 tablet (81 mg total) by mouth 2 (two) times daily. Patient not taking: Reported on 12/11/2017 01/20/17   Samson Frederic, MD  docusate sodium (COLACE) 100 MG capsule Take 1 capsule (100 mg total) by mouth 2 (two) times daily. Patient not taking: Reported on 12/11/2017 01/20/17   Samson Frederic, MD  HYDROcodone-acetaminophen (NORCO/VICODIN) 5-325 MG tablet Take 1-2 tablets by mouth every 4 (four) hours as needed (breakthrough pain). Patient not taking: Reported on 12/11/2017 01/20/17   Samson Frederic, MD  methocarbamol (ROBAXIN) 500 MG tablet Take 1 tablet (500 mg total) by mouth every 6 (six) hours as needed for muscle spasms. Patient not taking: Reported on 12/11/2017 01/20/17   Samson Frederic, MD  ondansetron (ZOFRAN) 4 MG tablet Take 1 tablet (4 mg total) by mouth every 6 (six) hours as needed for nausea. Patient not taking: Reported on 12/11/2017 01/20/17   Samson Frederic, MD  oxyCODONE (OXYCONTIN) 10 mg 12 hr tablet Take 1 tablet (10 mg total) by mouth PRO. 1 tab PO every 12 hours for 3 days, then 1 tab PO daily for 4 days Patient not taking: Reported on 12/11/2017 01/20/17   Samson Frederic, MD  senna (SENOKOT) 8.6 MG TABS tablet Take 2 tablets (17.2 mg total) by mouth at bedtime. Patient not taking: Reported on 12/11/2017 01/20/17   Samson Frederic, MD   Ct Knee Right Wo Contrast  Result Date: 12/11/2017 CLINICAL DATA:  46 year old male with right knee periprosthetic fracture EXAM: CT OF THE RIGHT KNEE WITHOUT CONTRAST TECHNIQUE: Multidetector CT imaging of the RIGHT knee was performed according to the standard protocol. Multiplanar CT image reconstructions were also generated. COMPARISON:  Concurrently obtained radiographs 12/11/2017 FINDINGS: Comminuted and posteromedially displaced and tilted  fracture through the distal femur just above the femoral condyles in the anterior aspect of the femoral components of the knee prosthesis. The distal fracture fragment is displaced by approximately 2.6 cm. The patella and tibia remain aligned with the femoral condyles. Moderate associated hemarthrosis and extensive surrounding soft tissue swelling. Streak artifact from the arthroplasty prosthesis slightly limits evaluation of fine osseous detail. No definite evidence of periprosthetic fracture along the tibial component. The visualized fibula is within normal limits. Atherosclerotic calcifications are present in the runoff arteries. IMPRESSION: 1. Comminuted and posteromedially displaced distal femoral periprosthetic fracture. Maximal displacement measures approximately 2.6 cm. 2. The patella and tibia remain aligned with the femoral condyles. 3. Moderate associated hemarthrosis and surrounding soft tissue swelling. 4. Atherosclerotic vascular calcifications. Electronically Signed   By: Malachy Moan M.D.   On: 12/11/2017 14:47   Dg Knee Complete 4 Views Right  Result Date: 12/11/2017 CLINICAL DATA:  Felt  a right knee with generalized right knee pain. Knee replacement performed on 01/18/2017. EXAM: RIGHT KNEE - COMPLETE 4+ VIEW COMPARISON:  01/18/2017 FINDINGS: There is a comminuted fracture of the distal femoral metaphysis just above the femoral component of the right knee prosthesis. Primary fracture is oblique transverse in orientation. The distal fracture component has displaced posteriorly by 2 cm. No other fractures. The knee prosthetic components remain well-seated and aligned. There is a joint effusion distending the suprapatellar joint capsule. Surrounding soft tissue swelling is noted. IMPRESSION: 1. Comminuted fracture of the distal right femur just above the femoral component of the right knee prosthesis. Distal fracture component is displaced posteriorly by 2 cm. 2. No other fractures.  No  dislocation. Electronically Signed   By: Amie Portlandavid  Ormond M.D.   On: 12/11/2017 11:29   Dg Femur Min 2 Views Right  Result Date: 12/11/2017 CLINICAL DATA:  The patient suffered a periprosthetic distal right femur fracture in a fall today. Initial encounter. EXAM: RIGHT FEMUR 2 VIEWS COMPARISON:  Plain films right knee this same day. FINDINGS: Periprosthetic femur fracture is again identified. No other acute bony or joint abnormality is seen. Mild right hip osteoarthritis noted. IMPRESSION: Periprosthetic distal right femur fracture as seen on the comparison examination. No other acute abnormality. Electronically Signed   By: Drusilla Kannerhomas  Dalessio M.D.   On: 12/11/2017 14:47    Positive ROS: All other systems have been reviewed and were otherwise negative with the exception of those mentioned in the HPI and as above.  Physical Exam: General: Alert, no acute distress Cardiovascular: No pedal edema Respiratory: No cyanosis, no use of accessory musculature GI: No organomegaly, abdomen is soft and non-tender Skin: No lesions in the area of chief complaint Neurologic: Sensation intact distally Psychiatric: Patient is competent for consent with normal mood and affect Lymphatic: No axillary or cervical lymphadenopathy  MUSCULOSKELETAL: Examination of the right lower extremity reveals that he has a deformity about the thigh.  No open skin wounds.  He has a healed anterior knee incision.  He has palpable pedal pulses.  He has positive motor function dorsiflexion, plantar flexion, and great toe extension, with strength limited by pain.  He has intact sensation.  Assessment: Periprosthetic right distal femur fracture.  Implant stable.  Plan: I discussed the findings with the patient.  He will require operative stabilization of the fracture.  We discussed the risks, benefits, and alternatives.  We will plan for intramedullary fixation on Monday.  N.p.o. after midnight on Sunday night.  He may receive Lovenox,  with the last dose given Sunday morning.   The risks, benefits, and alternatives were discussed with the patient. There are risks associated with the surgery including, but not limited to, problems with anesthesia (death), infection, differences in leg length/angulation/rotation, fracture of bones, loosening or failure of implants, malunion, nonunion, hematoma (blood accumulation) which may require surgical drainage, blood clots, pulmonary embolism, nerve injury (foot drop), and blood vessel injury. The patient understands these risks and elects to proceed.    Jonette PesaBrian J Mattison Stuckey, MD Cell [phone]    12/11/2017 7:00 PM

## 2017-12-11 NOTE — H&P (View-Only) (Signed)
ORTHOPAEDIC CONSULTATION  REQUESTING PHYSICIAN: Darlin DropHall, Carole N, DO  PCP:  Georgann HousekeeperHusain, Karrar, MD  Chief Complaint: Right lower extremity injury  HPI: Andre Hoffman is a 46 y.o. male who complains of right thigh pain and deformity.  He has a previous history of right total knee replacement done by myself in January 2017.  He has been doing very well.  He tripped and fell at work earlier today, injuring his right thigh.  He had pain and inability to weight-bear.  [facility], where x-rays revealed a periprosthetic distal femur fracture.  Orthopedic consultation was placed for management of the fracture.  Hospitalist was consulted for medical admission.  He denies other injuries.  Past Medical History:  Diagnosis Date  . Arthritis    OSTEO  . CHF (congestive heart failure) (HCC)    Echo- 2014- EF 40-45%; mild LVH.Takes Lasix daily  . History of bronchitis    3 yrs ago  . History of gout    only once   . Hypertension    takes Coreg and Losartan daily  . Joint pain   . White coat hypertension    Past Surgical History:  Procedure Laterality Date  . KNEE ARTHROPLASTY Right 01/18/2017   Procedure: RIGHT TOTAL KNEE ARTHROPLASTY WITH COMPUTER NAVIGATION;  Surgeon: Samson FredericBrian Verdelle Valtierra, MD;  Location: [facility] OR;  Service: Orthopedics;  Laterality: Right;  . TOTAL HIP ARTHROPLASTY Left 08/31/2016   Procedure: LEFT TOTAL HIP ARTHROPLASTY ANTERIOR APPROACH;  Surgeon: Samson FredericBrian Raidyn Wassink, MD;  Location: [facility] OR;  Service: Orthopedics;  Laterality: Left;  Requesting RNFA  . TOTAL KNEE ARTHROPLASTY Right 01/18/2017  . TRICEPS TENDON REPAIR Bilateral    Social History   Socioeconomic History  . Marital status: Married    Spouse name: None  . Number of children: None  . Years of education: None  . Highest education level: None  Social Needs  . Financial resource strain: None  . Food insecurity - worry: None  . Food insecurity - inability: None  .  Transportation needs - medical: None  . Transportation needs - non-medical: None  Occupational History  . None  Tobacco Use  . Smoking status: Never Smoker  . Smokeless tobacco: Never Used  Substance and Sexual Activity  . Alcohol use: No  . Drug use: No  . Sexual activity: None  Other Topics Concern  . None  Social History Narrative  . None   Family History  Problem Relation Age of Onset  . Other Father        MVA  . Hypertension Paternal Grandfather   . Other Paternal Grandfather        cardiac arrest  . Diabetes Paternal Grandfather   . Heart attack Paternal Grandfather    Allergies  Allergen Reactions  . Lisinopril Swelling    Angioedema  . Penicillins Swelling    SWELLING REACTION UNSPECIFIED as a child  Has patient had a PCN reaction causing immediate rash, facial/tongue/throat swelling, SOB or lightheadedness with hypotension: Unknown Has patient had a PCN reaction causing severe rash involving mucus membranes or skin necrosis: Unknown Has patient had a PCN reaction that required hospitalization: Unknown Has patient had a PCN reaction occurring within the last 10 years: No If all of the above answers are "NO", then may proceed with Cephalosporin use.     Prior to Admission medications   Medication Sig Start Date End Date Taking? Authorizing Provider  amLODipine (NORVASC) 10 MG tablet  Take 10 mg by mouth daily. 11/30/17  Yes [provider]  carvedilol (COREG) 25 MG tablet Take 25 mg by mouth 2 (two) times daily. 12/14/16  Yes [provider]  furosemide (LASIX) 40 MG tablet Take 20 mg by mouth daily.    Yes [provider]  hydrALAZINE (APRESOLINE) 25 MG tablet Take 25 mg by mouth 2 (two) times daily with a meal. 11/30/17  Yes [provider]  ibuprofen (ADVIL,MOTRIN) 200 MG tablet Take 600-800 mg by mouth every 6 (six) hours as needed for mild pain.   Yes [provider]  losartan (COZAAR) 100 MG tablet Take 100 mg  by mouth daily.   Yes [provider]  potassium chloride (K-DUR,KLOR-CON) 10 MEQ tablet Take 10 mEq by mouth daily.   Yes [provider]  aspirin 81 MG chewable tablet Chew 1 tablet (81 mg total) by mouth 2 (two) times daily. Patient not taking: Reported on 12/11/2017 01/20/17   Samson Frederic, MD  docusate sodium (COLACE) 100 MG capsule Take 1 capsule (100 mg total) by mouth 2 (two) times daily. Patient not taking: Reported on 12/11/2017 01/20/17   Samson Frederic, MD  HYDROcodone-acetaminophen (NORCO/VICODIN) 5-325 MG tablet Take 1-2 tablets by mouth every 4 (four) hours as needed (breakthrough pain). Patient not taking: Reported on 12/11/2017 01/20/17   Samson Frederic, MD  methocarbamol (ROBAXIN) 500 MG tablet Take 1 tablet (500 mg total) by mouth every 6 (six) hours as needed for muscle spasms. Patient not taking: Reported on 12/11/2017 01/20/17   Samson Frederic, MD  ondansetron (ZOFRAN) 4 MG tablet Take 1 tablet (4 mg total) by mouth every 6 (six) hours as needed for nausea. Patient not taking: Reported on 12/11/2017 01/20/17   Samson Frederic, MD  oxyCODONE (OXYCONTIN) 10 mg 12 hr tablet Take 1 tablet (10 mg total) by mouth PRO. 1 tab PO every 12 hours for 3 days, then 1 tab PO daily for 4 days Patient not taking: Reported on 12/11/2017 01/20/17   Samson Frederic, MD  senna (SENOKOT) 8.6 MG TABS tablet Take 2 tablets (17.2 mg total) by mouth at bedtime. Patient not taking: Reported on 12/11/2017 01/20/17   Samson Frederic, MD   Ct Knee Right Wo Contrast  Result Date: 12/11/2017 CLINICAL DATA:  46 year old male with right knee periprosthetic fracture EXAM: CT OF THE RIGHT KNEE WITHOUT CONTRAST TECHNIQUE: Multidetector CT imaging of the RIGHT knee was performed according to the standard protocol. Multiplanar CT image reconstructions were also generated. COMPARISON:  Concurrently obtained radiographs 12/11/2017 FINDINGS: Comminuted and posteromedially displaced and tilted  fracture through the distal femur just above the femoral condyles in the anterior aspect of the femoral components of the knee prosthesis. The distal fracture fragment is displaced by approximately 2.6 cm. The patella and tibia remain aligned with the femoral condyles. Moderate associated hemarthrosis and extensive surrounding soft tissue swelling. Streak artifact from the arthroplasty prosthesis slightly limits evaluation of fine osseous detail. No definite evidence of periprosthetic fracture along the tibial component. The visualized fibula is within normal limits. Atherosclerotic calcifications are present in the runoff arteries. IMPRESSION: 1. Comminuted and posteromedially displaced distal femoral periprosthetic fracture. Maximal displacement measures approximately 2.6 cm. 2. The patella and tibia remain aligned with the femoral condyles. 3. Moderate associated hemarthrosis and surrounding soft tissue swelling. 4. Atherosclerotic vascular calcifications. Electronically Signed   By: Malachy Moan M.D.   On: 12/11/2017 14:47   Dg Knee Complete 4 Views Right  Result Date: 12/11/2017 CLINICAL DATA:  Felt  a right knee with generalized right knee pain. Knee replacement performed on 01/18/2017. EXAM: RIGHT KNEE - COMPLETE 4+ VIEW COMPARISON:  01/18/2017 FINDINGS: There is a comminuted fracture of the distal femoral metaphysis just above the femoral component of the right knee prosthesis. Primary fracture is oblique transverse in orientation. The distal fracture component has displaced posteriorly by 2 cm. No other fractures. The knee prosthetic components remain well-seated and aligned. There is a joint effusion distending the suprapatellar joint capsule. Surrounding soft tissue swelling is noted. IMPRESSION: 1. Comminuted fracture of the distal right femur just above the femoral component of the right knee prosthesis. Distal fracture component is displaced posteriorly by 2 cm. 2. No other fractures.  No  dislocation. Electronically Signed   By: Amie Portlandavid  Ormond M.D.   On: 12/11/2017 11:29   Dg Femur Min 2 Views Right  Result Date: 12/11/2017 CLINICAL DATA:  The patient suffered a periprosthetic distal right femur fracture in a fall today. Initial encounter. EXAM: RIGHT FEMUR 2 VIEWS COMPARISON:  Plain films right knee this same day. FINDINGS: Periprosthetic femur fracture is again identified. No other acute bony or joint abnormality is seen. Mild right hip osteoarthritis noted. IMPRESSION: Periprosthetic distal right femur fracture as seen on the comparison examination. No other acute abnormality. Electronically Signed   By: Drusilla Kannerhomas  Dalessio M.D.   On: 12/11/2017 14:47    Positive ROS: All other systems have been reviewed and were otherwise negative with the exception of those mentioned in the HPI and as above.  Physical Exam: General: Alert, no acute distress Cardiovascular: No pedal edema Respiratory: No cyanosis, no use of accessory musculature GI: No organomegaly, abdomen is soft and non-tender Skin: No lesions in the area of chief complaint Neurologic: Sensation intact distally Psychiatric: Patient is competent for consent with normal mood and affect Lymphatic: No axillary or cervical lymphadenopathy  MUSCULOSKELETAL: Examination of the right lower extremity reveals that he has a deformity about the thigh.  No open skin wounds.  He has a healed anterior knee incision.  He has palpable pedal pulses.  He has positive motor function dorsiflexion, plantar flexion, and great toe extension, with strength limited by pain.  He has intact sensation.  Assessment: Periprosthetic right distal femur fracture.  Implant stable.  Plan: I discussed the findings with the patient.  He will require operative stabilization of the fracture.  We discussed the risks, benefits, and alternatives.  We will plan for intramedullary fixation on Monday.  N.p.o. after midnight on Sunday night.  He may receive Lovenox,  with the last dose given Sunday morning.   The risks, benefits, and alternatives were discussed with the patient. There are risks associated with the surgery including, but not limited to, problems with anesthesia (death), infection, differences in leg length/angulation/rotation, fracture of bones, loosening or failure of implants, malunion, nonunion, hematoma (blood accumulation) which may require surgical drainage, blood clots, pulmonary embolism, nerve injury (foot drop), and blood vessel injury. The patient understands these risks and elects to proceed.    Jonette PesaBrian J Gaylynn Seiple, MD Cell [phone]    12/11/2017 7:00 PM

## 2017-12-11 NOTE — ED Triage Notes (Signed)
EMS reports fall to right knee, pain and inability to bare weight. Prior right Knee replacement Jan 18, 2017. Also had left Hip replace Sept 2017. Hx HTN, and CHF  BP 172/100 HR 90 Resp 16

## 2017-12-11 NOTE — ED Notes (Signed)
Pt taken to complete CT scan. Vitals will be reassessed and documented upon rtn to room. [employer]

## 2017-12-12 MED ORDER — SODIUM CHLORIDE 0.9 % IV SOLN
INTRAVENOUS | Status: DC
  Administered 2017-12-12 – 2017-12-17 (×5): via INTRAVENOUS

## 2017-12-12 MED ORDER — HYDROCODONE-ACETAMINOPHEN 5-325 MG PO TABS
1.0000 | ORAL_TABLET | Freq: Four times a day (QID) | ORAL | Status: AC | PRN
  Administered 2017-12-12 – 2017-12-13 (×2): 1 via ORAL
  Filled 2017-12-12 (×2): qty 1

## 2017-12-12 MED ORDER — DIPHENHYDRAMINE HCL 25 MG PO CAPS
25.0000 mg | ORAL_CAPSULE | Freq: Four times a day (QID) | ORAL | Status: DC | PRN
  Administered 2017-12-12 – 2017-12-13 (×3): 25 mg via ORAL
  Filled 2017-12-12 (×3): qty 1

## 2017-12-12 NOTE — Progress Notes (Signed)
    Subjective:   Procedure(s) (LRB): INTRAMEDULLARY (IM) NAIL FEMORAL (Right) Patient reports pain as 3 on 0-10 scale.   Denies CP or SOB.  Voiding without difficulty. Positive flatus. Objective: Vital signs in last 24 hours: Temp:  [97 F (36.1 C)-99.5 F (37.5 C)] 97 F (36.1 C) (12/23 0602) Pulse Rate:  [79-90] 85 (12/23 0602) Resp:  [13-21] 20 (12/22 1817) BP: (162-217)/(83-110) 166/88 (12/23 0602) SpO2:  [97 %-100 %] 100 % (12/23 0602)  Intake/Output from previous day: 12/22 0701 - 12/23 0700 In: -  Out: 600 [Urine:600] Intake/Output this shift: No intake/output data recorded.  Labs: Recent Labs    12/11/17 1038  HGB 13.8   Recent Labs    12/11/17 1038  WBC 6.2  RBC 4.07*  HCT 39.5  PLT 274   Recent Labs    12/11/17 1038  NA 138  K 3.9  CL 104  CO2 21*  BUN 21*  CREATININE 1.12  GLUCOSE 120*  CALCIUM 9.0   No results for input(s): LABPT, INR in the last 72 hours.  Physical Exam: Neurologically intact Intact pulses distally Compartment soft  Assessment/Plan:   Procedure(s) (LRB): INTRAMEDULLARY (IM) NAIL FEMORAL (Right) Plan on IM nail Monday NPO after midnight tonight No new recommendations  Maryelizabeth Eberle D for Dr. Venita Lickahari Lanita Stammen [facility] [phone] 12/12/2017, 10:16 AM

## 2017-12-12 NOTE — Plan of Care (Signed)
  Progressing Education: Knowledge of General Education information will improve 12/12/2017 1432 - Progressing by Darreld Mcleanox, Demetrios Byron, RN Health Behavior/Discharge Planning: Ability to manage health-related needs will improve 12/12/2017 1432 - Progressing by Darreld Mcleanox, Zakir Henner, RN Clinical Measurements: Ability to maintain clinical measurements within normal limits will improve 12/12/2017 1432 - Progressing by Darreld Mcleanox, Makael Stein, RN Will remain free from infection 12/12/2017 1432 - Progressing by Darreld Mcleanox, Kioni Stahl, RN Diagnostic test results will improve 12/12/2017 1432 - Progressing by Darreld Mcleanox, Breeley Bischof, RN Respiratory complications will improve 12/12/2017 1432 - Progressing by Darreld Mcleanox, Brittie Whisnant, RN Cardiovascular complication will be avoided 12/12/2017 1432 - Progressing by Darreld Mcleanox, Lorane Cousar, RN Activity: Risk for activity intolerance will decrease 12/12/2017 1432 - Progressing by Darreld Mcleanox, Princetta Uplinger, RN Nutrition: Adequate nutrition will be maintained 12/12/2017 1432 - Progressing by Darreld Mcleanox, Hazel Wrinkle, RN Coping: Level of anxiety will decrease 12/12/2017 1432 - Progressing by Darreld Mcleanox, Seniah Lawrence, RN Elimination: Will not experience complications related to bowel motility 12/12/2017 1432 - Progressing by Darreld Mcleanox, Saige Canton, RN Will not experience complications related to urinary retention 12/12/2017 1432 - Progressing by Darreld Mcleanox, Verlie Hellenbrand, RN Pain Managment: General experience of comfort will improve 12/12/2017 1432 - Progressing by Darreld Mcleanox, Sabin Gibeault, RN Safety: Ability to remain free from injury will improve 12/12/2017 1432 - Progressing by Darreld Mcleanox, Kasch Borquez, RN Skin Integrity: Risk for impaired skin integrity will decrease 12/12/2017 1432 - Progressing by Darreld Mcleanox, Jurgen Groeneveld, RN

## 2017-12-12 NOTE — Progress Notes (Signed)
PROGRESS NOTE    Andre Hoffman  WGN:[id number] DOB: 1971-10-19 DOA: 12/11/2017 PCP: Georgann HousekeeperHusain, Karrar, MD    Brief Narrative: Andre Hoffman is a 46 y.o. male with medical history significant for HTN, obesity, OA with previous left hip replacement in 2017 and total right knee replacement in January 2018 who [facility] with complaints of right knee pain after a fall. . Denies loss of conciousness, chest pain, or palpitations. States he was in his usual state of health prior to the fall.    Assessment & Plan:   Active Problems:   Other fracture of right femur, initial encounter for closed fracture [facility](HCC)   Stress fracture, right femur, initial encounter for fracture    Acute comminuted fracture of the distal right femur fracture: orthopedics consulted and plan for surgery in am.  Pain control.  PT/ot post surgery.  Bowel regimen.    Accelerated hypertension:  Resume norvasc, coreg.  Prn hydralazine.   Near syncope:  Unclear etiology.  Orthostatic hypotension.   DVT prophylaxis: scd's Code Status: full code.  Family Communication: none at bedside.  Disposition Plan: pending surgery and PT eval. .   Consultants:   Orthopedics.   Procedures:none    Antimicrobials: none.    Subjective: Pain well controlled.   Objective: Vitals:   12/11/17 2150 12/12/17 0036 12/12/17 0602 12/12/17 1713  BP: (!) 198/94 (!) 175/84 (!) 166/88 (!) 156/81  Pulse: 80 86 85 85  Resp:    18  Temp: 98.7 F (37.1 C)  (!) 97 F (36.1 C) 99.7 F (37.6 C)  TempSrc: Oral  Oral Oral  SpO2: 99% 98% 100% 99%  Weight:      Height:        Intake/Output Summary (Last 24 hours) at 12/12/2017 1718 Last data filed at 12/11/2017 2154 Gross per 24 hour  Intake -  Output 600 ml  Net -600 ml   Filed Weights   12/11/17 1010  Weight: (!) 149.7 kg (330 lb)    Examination:  General exam: Appears calm and comfortable  Respiratory system: Clear to auscultation. Respiratory effort  normal. Cardiovascular system: S1 & S2 heard, RRR. No JVD, murmurs, rubs, gallops or clicks. No pedal edema. Gastrointestinal system: Abdomen is nondistended, soft and nontender. No organomegaly or masses felt. Normal bowel sounds heard. Central nervous system: Alert and oriented. No focal neurological deficits. Extremities: ROM painful on the right.  Skin: No rashes, lesions or ulcers Psychiatry: Judgement and insight appear normal. Mood & affect appropriate.     Data Reviewed: I have personally reviewed following labs and imaging studies  CBC: Recent Labs  Lab 12/11/17 1038  WBC 6.2  NEUTROABS 4.0  HGB 13.8  HCT 39.5  MCV 97.1  PLT 274   Basic Metabolic Panel: Recent Labs  Lab 12/11/17 1038  NA 138  K 3.9  CL 104  CO2 21*  GLUCOSE 120*  BUN 21*  CREATININE 1.12  CALCIUM 9.0  MG 1.8   GFR: Estimated Creatinine Clearance: 127.3 mL/min (by C-G formula based on SCr of 1.12 mg/dL). Liver Function Tests: No results for input(s): AST, ALT, ALKPHOS, BILITOT, PROT, ALBUMIN in the last 168 hours. No results for input(s): LIPASE, AMYLASE in the last 168 hours. No results for input(s): AMMONIA in the last 168 hours. Coagulation Profile: No results for input(s): INR, PROTIME in the last 168 hours. Cardiac Enzymes: No results for input(s): CKTOTAL, CKMB, CKMBINDEX, TROPONINI in the last 168 hours. BNP (last 3 results) No results  for input(s): PROBNP in the last 8760 hours. HbA1C: No results for input(s): HGBA1C in the last 72 hours. CBG: No results for input(s): GLUCAP in the last 168 hours. Lipid Profile: No results for input(s): CHOL, HDL, LDLCALC, TRIG, CHOLHDL, LDLDIRECT in the last 72 hours. Thyroid Function Tests: No results for input(s): TSH, T4TOTAL, FREET4, T3FREE, THYROIDAB in the last 72 hours. Anemia Panel: No results for input(s): VITAMINB12, FOLATE, FERRITIN, TIBC, IRON, RETICCTPCT in the last 72 hours. Sepsis Labs: No results for input(s): PROCALCITON,  LATICACIDVEN in the last 168 hours.  No results found for this or any previous visit (from the past 240 hour(s)).       Radiology Studies: Ct Knee Right Wo Contrast  Result Date: 12/11/2017 CLINICAL DATA:  46 year old male with right knee periprosthetic fracture EXAM: CT OF THE RIGHT KNEE WITHOUT CONTRAST TECHNIQUE: Multidetector CT imaging of the RIGHT knee was performed according to the standard protocol. Multiplanar CT image reconstructions were also generated. COMPARISON:  Concurrently obtained radiographs 12/11/2017 FINDINGS: Comminuted and posteromedially displaced and tilted fracture through the distal femur just above the femoral condyles in the anterior aspect of the femoral components of the knee prosthesis. The distal fracture fragment is displaced by approximately 2.6 cm. The patella and tibia remain aligned with the femoral condyles. Moderate associated hemarthrosis and extensive surrounding soft tissue swelling. Streak artifact from the arthroplasty prosthesis slightly limits evaluation of fine osseous detail. No definite evidence of periprosthetic fracture along the tibial component. The visualized fibula is within normal limits. Atherosclerotic calcifications are present in the runoff arteries. IMPRESSION: 1. Comminuted and posteromedially displaced distal femoral periprosthetic fracture. Maximal displacement measures approximately 2.6 cm. 2. The patella and tibia remain aligned with the femoral condyles. 3. Moderate associated hemarthrosis and surrounding soft tissue swelling. 4. Atherosclerotic vascular calcifications. Electronically Signed   By: Malachy MoanHeath  McCullough M.D.   On: 12/11/2017 14:47   Dg Knee Complete 4 Views Right  Result Date: 12/11/2017 CLINICAL DATA:  Felt a right knee with generalized right knee pain. Knee replacement performed on 01/18/2017. EXAM: RIGHT KNEE - COMPLETE 4+ VIEW COMPARISON:  01/18/2017 FINDINGS: There is a comminuted fracture of the distal femoral  metaphysis just above the femoral component of the right knee prosthesis. Primary fracture is oblique transverse in orientation. The distal fracture component has displaced posteriorly by 2 cm. No other fractures. The knee prosthetic components remain well-seated and aligned. There is a joint effusion distending the suprapatellar joint capsule. Surrounding soft tissue swelling is noted. IMPRESSION: 1. Comminuted fracture of the distal right femur just above the femoral component of the right knee prosthesis. Distal fracture component is displaced posteriorly by 2 cm. 2. No other fractures.  No dislocation. Electronically Signed   By: Amie Portlandavid  Ormond M.D.   On: 12/11/2017 11:29   Dg Femur Min 2 Views Right  Result Date: 12/11/2017 CLINICAL DATA:  The patient suffered a periprosthetic distal right femur fracture in a fall today. Initial encounter. EXAM: RIGHT FEMUR 2 VIEWS COMPARISON:  Plain films right knee this same day. FINDINGS: Periprosthetic femur fracture is again identified. No other acute bony or joint abnormality is seen. Mild right hip osteoarthritis noted. IMPRESSION: Periprosthetic distal right femur fracture as seen on the comparison examination. No other acute abnormality. Electronically Signed   By: Drusilla Kannerhomas  Dalessio M.D.   On: 12/11/2017 14:47        Scheduled Meds: . amLODipine  10 mg Oral Daily  . carvedilol  25 mg Oral BID  . hydrALAZINE  25 mg Oral BID WC  . lidocaine  1 patch Transdermal Q24H  . senna  2 tablet Oral QHS   Continuous Infusions: . sodium chloride 75 mL/hr at 12/12/17 0939     LOS: 1 day    Time spent: 35 minutes.     Kathlen Mody, MD [facility] Pager [phone]  If 7PM-7AM, please contact night-coverage [email or web address] Password [email or web address] 12/12/2017, 5:18 PM

## 2017-12-13 ENCOUNTER — Other Ambulatory Visit: Payer: Self-pay

## 2017-12-13 ENCOUNTER — Inpatient Hospital Stay (HOSPITAL_COMMUNITY): Payer: 59 | Admitting: Certified Registered Nurse Anesthetist

## 2017-12-13 ENCOUNTER — Encounter (HOSPITAL_COMMUNITY)
Admission: EM | Disposition: A | Discharge status: Home Health | Payer: Self-pay | Source: Home / Self Care | Attending: Internal Medicine

## 2017-12-13 ENCOUNTER — Inpatient Hospital Stay (HOSPITAL_COMMUNITY): Payer: 59

## 2017-12-13 LAB — CBC WITH DIFFERENTIAL/PLATELET
BASOS PCT: 0 %
Basophils Absolute: 0 10*3/uL (ref 0.0–0.1)
EOS ABS: 0.1 10*3/uL (ref 0.0–0.7)
EOS PCT: 1 %
HCT: 29.8 % — ABNORMAL LOW (ref 39.0–52.0)
HEMOGLOBIN: 9.9 g/dL — AB (ref 13.0–17.0)
Lymphocytes Relative: 11 %
Lymphs Abs: 1.1 10*3/uL (ref 0.7–4.0)
MCH: 33.2 pg (ref 26.0–34.0)
MCHC: 33.2 g/dL (ref 30.0–36.0)
MCV: 100 fL (ref 78.0–100.0)
Monocytes Absolute: 0.7 10*3/uL (ref 0.1–1.0)
Monocytes Relative: 7 %
NEUTROS PCT: 81 %
Neutro Abs: 8.4 10*3/uL — ABNORMAL HIGH (ref 1.7–7.7)
PLATELETS: 201 10*3/uL (ref 150–400)
RBC: 2.98 MIL/uL — AB (ref 4.22–5.81)
RDW: 14.4 % (ref 11.5–15.5)
WBC: 10.3 10*3/uL (ref 4.0–10.5)

## 2017-12-13 LAB — MRSA PCR SCREENING: MRSA BY PCR: NEGATIVE

## 2017-12-13 SURGERY — INSERTION, INTRAMEDULLARY ROD, FEMUR
Anesthesia: General | Laterality: Right

## 2017-12-13 MED ORDER — SODIUM CHLORIDE 0.9 % IV BOLUS (SEPSIS)
500.0000 mL | Freq: Once | INTRAVENOUS | Status: AC
  Administered 2017-12-14: 500 mL via INTRAVENOUS

## 2017-12-13 MED ORDER — PHENYLEPHRINE 40 MCG/ML (10ML) SYRINGE FOR IV PUSH (FOR BLOOD PRESSURE SUPPORT)
PREFILLED_SYRINGE | INTRAVENOUS | Status: AC
  Filled 2017-12-13: qty 30

## 2017-12-13 MED ORDER — ACETAMINOPHEN 10 MG/ML IV SOLN
INTRAVENOUS | Status: AC
  Filled 2017-12-13: qty 100

## 2017-12-13 MED ORDER — HYDROMORPHONE HCL 1 MG/ML IJ SOLN: 0.2500 mg | INTRAMUSCULAR | Status: DC | PRN

## 2017-12-13 MED ORDER — CLINDAMYCIN PHOSPHATE 900 MG/50ML IV SOLN
900.0000 mg | INTRAVENOUS | Status: DC
  Filled 2017-12-13 (×2): qty 50

## 2017-12-13 MED ORDER — LIDOCAINE 2% (20 MG/ML) 5 ML SYRINGE
INTRAMUSCULAR | Status: AC
  Filled 2017-12-13: qty 10

## 2017-12-13 MED ORDER — MIDAZOLAM HCL 2 MG/2ML IJ SOLN
INTRAMUSCULAR | Status: AC
  Filled 2017-12-13: qty 2

## 2017-12-13 MED ORDER — LACTATED RINGERS IV SOLN
INTRAVENOUS | Status: DC | PRN
  Administered 2017-12-13: 20:00:00 via INTRAVENOUS

## 2017-12-13 MED ORDER — CEFAZOLIN SODIUM 1 G IJ SOLR
INTRAMUSCULAR | Status: AC
  Filled 2017-12-13: qty 20

## 2017-12-13 MED ORDER — POVIDONE-IODINE 10 % EX SWAB
2.0000 | Freq: Once | CUTANEOUS | Status: DC
Start: 2017-12-13 — End: 2017-12-13

## 2017-12-13 MED ORDER — MIDAZOLAM HCL 5 MG/5ML IJ SOLN
INTRAMUSCULAR | Status: DC | PRN
  Administered 2017-12-13: 2 mg via INTRAVENOUS

## 2017-12-13 MED ORDER — ONDANSETRON HCL 4 MG/2ML IJ SOLN: 4.0000 mg | Freq: Four times a day (QID) | INTRAMUSCULAR | Status: DC | PRN

## 2017-12-13 MED ORDER — ENOXAPARIN SODIUM 80 MG/0.8ML ~~LOC~~ SOLN: 75.0000 mg | SUBCUTANEOUS | Status: DC

## 2017-12-13 MED ORDER — ACETAMINOPHEN 10 MG/ML IV SOLN
INTRAVENOUS | Status: DC | PRN
  Administered 2017-12-13: 1000 mg via INTRAVENOUS

## 2017-12-13 MED ORDER — FENTANYL CITRATE (PF) 100 MCG/2ML IJ SOLN
INTRAMUSCULAR | Status: DC | PRN
  Administered 2017-12-13: 150 ug via INTRAVENOUS

## 2017-12-13 MED ORDER — ONDANSETRON HCL 4 MG/2ML IJ SOLN
INTRAMUSCULAR | Status: DC | PRN
  Administered 2017-12-13: 4 mg via INTRAVENOUS

## 2017-12-13 MED ORDER — ROCURONIUM BROMIDE 10 MG/ML (PF) SYRINGE
PREFILLED_SYRINGE | INTRAVENOUS | Status: AC
  Filled 2017-12-13: qty 20

## 2017-12-13 MED ORDER — PROPOFOL 10 MG/ML IV BOLUS
INTRAVENOUS | Status: AC
  Filled 2017-12-13: qty 20

## 2017-12-13 MED ORDER — SUGAMMADEX SODIUM 200 MG/2ML IV SOLN
INTRAVENOUS | Status: DC | PRN
  Administered 2017-12-13: 300 mg via INTRAVENOUS

## 2017-12-13 MED ORDER — OXYCODONE HCL 5 MG PO TABS: 5.0000 mg | ORAL_TABLET | Freq: Once | ORAL | Status: DC | PRN

## 2017-12-13 MED ORDER — CHLORHEXIDINE GLUCONATE 4 % EX LIQD
60.0000 mL | Freq: Once | CUTANEOUS | Status: DC
Start: 2017-12-13 — End: 2017-12-13

## 2017-12-13 MED ORDER — ROCURONIUM BROMIDE 100 MG/10ML IV SOLN
INTRAVENOUS | Status: DC | PRN
  Administered 2017-12-13: 50 mg via INTRAVENOUS

## 2017-12-13 MED ORDER — LIDOCAINE HCL (CARDIAC) 20 MG/ML IV SOLN
INTRAVENOUS | Status: DC | PRN
  Administered 2017-12-13: 80 mg via INTRAVENOUS

## 2017-12-13 MED ORDER — ONDANSETRON HCL 4 MG/2ML IJ SOLN
INTRAMUSCULAR | Status: AC
  Filled 2017-12-13: qty 4

## 2017-12-13 MED ORDER — PROPOFOL 10 MG/ML IV BOLUS
INTRAVENOUS | Status: DC | PRN
  Administered 2017-12-13: 250 mg via INTRAVENOUS

## 2017-12-13 MED ORDER — OXYCODONE HCL 5 MG/5ML PO SOLN: 5.0000 mg | Freq: Once | ORAL | Status: DC | PRN

## 2017-12-13 MED ORDER — PHENYLEPHRINE HCL 10 MG/ML IJ SOLN
INTRAVENOUS | Status: DC | PRN
  Administered 2017-12-13: 25 ug/min via INTRAVENOUS

## 2017-12-13 MED ORDER — DEXAMETHASONE SODIUM PHOSPHATE 10 MG/ML IJ SOLN
INTRAMUSCULAR | Status: AC
  Filled 2017-12-13: qty 1

## 2017-12-13 MED ORDER — FENTANYL CITRATE (PF) 250 MCG/5ML IJ SOLN
INTRAMUSCULAR | Status: AC
  Filled 2017-12-13: qty 5

## 2017-12-13 SURGICAL SUPPLY — 37 items
ALCOHOL ISOPROPYL (RUBBING) (MISCELLANEOUS) ×2 IMPLANT
BNDG COHESIVE 4X5 TAN STRL (GAUZE/BANDAGES/DRESSINGS) IMPLANT
CHLORAPREP W/TINT 26ML (MISCELLANEOUS) IMPLANT
COVER PERINEAL POST (MISCELLANEOUS) IMPLANT
COVER SURGICAL LIGHT HANDLE (MISCELLANEOUS) ×2 IMPLANT
DERMABOND ADVANCED (GAUZE/BANDAGES/DRESSINGS)
DERMABOND ADVANCED .7 DNX12 (GAUZE/BANDAGES/DRESSINGS) IMPLANT
DRAPE C-ARM 42X72 X-RAY (DRAPES) IMPLANT
DRAPE C-ARMOR (DRAPES) IMPLANT
DRAPE IMP U-DRAPE 54X76 (DRAPES) IMPLANT
DRAPE STERI IOBAN 125X83 (DRAPES) IMPLANT
DRAPE U-SHAPE 47X51 STRL (DRAPES) IMPLANT
DRAPE UNIVERSAL PACK (DRAPES) IMPLANT
DRSG MEPILEX BORDER 4X4 (GAUZE/BANDAGES/DRESSINGS) IMPLANT
ELECT REM PT RETURN 9FT ADLT (ELECTROSURGICAL)
ELECTRODE REM PT RTRN 9FT ADLT (ELECTROSURGICAL) IMPLANT
FACESHIELD WRAPAROUND (MASK) IMPLANT
GLOVE BIO SURGEON STRL SZ8.5 (GLOVE) IMPLANT
GLOVE BIOGEL PI IND STRL 8.5 (GLOVE) IMPLANT
GLOVE BIOGEL PI INDICATOR 8.5 (GLOVE)
GOWN STRL REUS W/ TWL LRG LVL3 (GOWN DISPOSABLE) IMPLANT
GOWN STRL REUS W/TWL 2XL LVL3 (GOWN DISPOSABLE) IMPLANT
GOWN STRL REUS W/TWL LRG LVL3 (GOWN DISPOSABLE)
KIT ROOM TURNOVER OR (KITS) ×2 IMPLANT
MANIFOLD NEPTUNE II (INSTRUMENTS) ×2 IMPLANT
MARKER SKIN DUAL TIP RULER LAB (MISCELLANEOUS) ×2 IMPLANT
NS IRRIG 1000ML POUR BTL (IV SOLUTION) ×2 IMPLANT
PACK GENERAL/GYN (CUSTOM PROCEDURE TRAY) IMPLANT
PAD ARMBOARD 7.5X6 YLW CONV (MISCELLANEOUS) IMPLANT
SUT MNCRL AB 3-0 PS2 18 (SUTURE) IMPLANT
SUT MNCRL AB 3-0 PS2 27 (SUTURE) IMPLANT
SUT MON AB 2-0 CT1 27 (SUTURE) IMPLANT
SUT MON AB 2-0 CT1 36 (SUTURE) IMPLANT
SUT VIC AB 1 CT1 27 (SUTURE)
SUT VIC AB 1 CT1 27XBRD ANBCTR (SUTURE) IMPLANT
TOWEL OR 17X24 6PK STRL BLUE (TOWEL DISPOSABLE) IMPLANT
TOWEL OR 17X26 10 PK STRL BLUE (TOWEL DISPOSABLE) IMPLANT

## 2017-12-13 NOTE — Progress Notes (Signed)
ANTICOAGULATION CONSULT NOTE - Initial Consult  Pharmacy Consult for lovenox Indication: VTE prophylaxis  Allergies  Allergen Reactions  . Lisinopril Swelling    Angioedema  . Penicillins Swelling    SWELLING REACTION UNSPECIFIED as a child  Has patient had a PCN reaction causing immediate rash, facial/tongue/throat swelling, SOB or lightheadedness with hypotension: Unknown Has patient had a PCN reaction causing severe rash involving mucus membranes or skin necrosis: Unknown Has patient had a PCN reaction that required hospitalization: Unknown Has patient had a PCN reaction occurring within the last 10 years: No If all of the above answers are "NO", then may proceed with Cephalosporin use.      Patient Measurements: Height: 6\' 2"  (188 cm) Weight: (!) 330 lb (149.7 kg) IBW/kg (Calculated) : 82.2   Vital Signs: Temp: 99.9 F (37.7 C) (12/24 2122) Temp Source: Oral (12/24 1430) BP: 137/67 (12/24 2122) Pulse Rate: 90 (12/24 2122)  Labs: Recent Labs    12/11/17 1038  HGB 13.8  HCT 39.5  PLT 274  CREATININE 1.12    Estimated Creatinine Clearance: 127.3 mL/min (by C-G formula based on SCr of 1.12 mg/dL).   Medical History: Past Medical History:  Diagnosis Date  . Arthritis    OSTEO  . CHF (congestive heart failure) (HCC)    Echo- 2014- EF 40-45%; mild LVH.Takes Lasix daily  . History of bronchitis    3 yrs ago  . History of gout    only once   . Hypertension    takes Coreg and Losartan daily  . Joint pain   . White coat hypertension    Assessment: 46 year old male admitted after fall with fx. Surgery cancelled after patient found to have fever in OR. Pharmacy consulted for VTE prophylaxis for the next two days. Will dose lovenox 0.5mg /kg/day given patients weight of ~150kg. Normal renal function.  Goal of Therapy:  Monitor platelets by anticoagulation protocol: Yes   Plan:  Lovenox 75mg  q24 hours x 2 doses  Sheppard CoilFrank Wilson PharmD., BCPS Clinical  Pharmacist 12/13/2017 9:38 PM

## 2017-12-13 NOTE — Plan of Care (Addendum)
Pt admitted after fall with a fx. He was in OR tonight. Dr. Veda CanningSwintek, ortho, called this NP. Surgery was cancelled because after pt was put under general anesthesia and temp probe applied, pt's fever was 101.991f. BP 145/72. HR 80-90s. O2 sat 96% on RA.  Will work pt up for fever with CXR, LAs, CBC with diff, CMP, UA/culture, bld cultures. Surgery mentioned PE, but NP doubts, because pt has not had CP, anxiety, tachycardia, or hypoxia.  Surgery should be Wed 12/26 if all goes well. Dr. Veda CanningSwintek said pt can have Lovenox for VTE tonight and tomorrow night, then hold. NPO after MN on 12/25. Will follow. KJKG, NP [facility] Update: Temp coming down. Rest VSS. CXR with low lung volumes, no opacity. Ordered IS. CBC with diff stable. No leukocytosis. CMP with slightly increased creat. Continue IVF and gave another bolus. LA 2, bolus, continue to cycle. Blood cx and urine pending.  KJKG, NP [facility]

## 2017-12-13 NOTE — OR Nursing (Signed)
Procedure cancelled per Dr.Swinteck prior to incision due to temperature of patient.

## 2017-12-13 NOTE — Anesthesia Preprocedure Evaluation (Addendum)
Anesthesia Evaluation  Patient identified by MRN, date of birth, ID band Patient awake    Reviewed: Allergy & Precautions, H&P , NPO status , Patient's Chart, lab work & pertinent test results  Airway Mallampati: II   Neck ROM: full    Dental   Pulmonary neg pulmonary ROS,    breath sounds clear to auscultation       Cardiovascular hypertension, +CHF   Rhythm:regular Rate:Normal     Neuro/Psych    GI/Hepatic   Endo/Other  Morbid obesity  Renal/GU      Musculoskeletal  (+) Arthritis , Osteoarthritis,    Abdominal   Peds  Hematology   Anesthesia Other Findings   Reproductive/Obstetrics                            Anesthesia Physical Anesthesia Plan  ASA: II  Anesthesia Plan: General   Post-op Pain Management:    Induction: Intravenous  PONV Risk Score and Plan: 1 and Ondansetron and Treatment may vary due to age or medical condition  Airway Management Planned: Oral ETT  Additional Equipment:   Intra-op Plan:   Post-operative Plan: Extubation in OR  Informed Consent: I have reviewed the patients History and Physical, chart, labs and discussed the procedure including the risks, benefits and alternatives for the proposed anesthesia with the patient or authorized representative who has indicated his/her understanding and acceptance.     Plan Discussed with: CRNA, Anesthesiologist and Surgeon  Anesthesia Plan Comments:        Anesthesia Quick Evaluation

## 2017-12-13 NOTE — Interval H&P Note (Signed)
History and Physical Interval Note:  12/13/2017 8:15 PM  Andre Hoffman  has presented today for surgery, with the diagnosis of periprosthetic/periimplant right femur fracture  The various methods of treatment have been discussed with the patient and family. After consideration of risks, benefits and other options for treatment, the patient has consented to  Procedure(s): INTRAMEDULLARY (IM) NAIL FEMORAL (Right) as a surgical intervention .  The patient's history has been reviewed, patient examined, no change in status, stable for surgery.  I have reviewed the patient's chart and labs.  Questions were answered to the patient's satisfaction.     Iline OvenBrian J Zyriah Mask

## 2017-12-13 NOTE — Progress Notes (Signed)
After successful induction of general anesthesia, temperature probe was placed, and patient found to be febrile (101.8). Will cancel surgery and have hospitalist work up fever. Will reschedule surgery for Wed. NPO after MN Tues night. Hold chemical DVT ppx after Tues am dose.  Discussed with patient's wife.

## 2017-12-13 NOTE — Progress Notes (Addendum)
[facility] PROGRESS NOTE  Andre Hoffman [id number] DOB: [birth year] DOA: 12/11/2017  PCP: Georgann HousekeeperHusain, Karrar, MD  Brief History/Interval Summary: 46 year old male with a past medical history of hypertension, obesity, osteoarthritis previous left hip replacement in 2017 and right knee replacement in January 2018 presented after sustaining a mechanical fall while at work.  This resulted in distal right femur fracture.  Reason for Visit: Periprosthetic fracture of the distal right femur  Consultants: Orthopedics  Procedures: None yet  Antibiotics: None  Subjective/Interval History: Patient denies any complaints this morning.  States that he is doing well.  Pain is well controlled.  He absolutely denies any passing out episode at work when he fell.  Denies any chest pain shortness of breath.  ROS: Denies any nausea or vomiting.  Objective:  Vital Signs  Vitals:   12/12/17 0602 12/12/17 1713 12/12/17 2137 12/13/17 0618  BP: (!) 166/88 (!) 156/81 (!) 164/90 140/80  Pulse: 85 85 97 95  Resp:  18 17 18   Temp: (!) 97 F (36.1 C) 99.7 F (37.6 C) 99.7 F (37.6 C) 100.2 F (37.9 C)  TempSrc: Oral Oral Oral Oral  SpO2: 100% 99% 100% 99%  Weight:      Height:       No intake or output data in the 24 hours ending 12/13/17 1320 Filed Weights   12/11/17 1010  Weight: (!) 149.7 kg (330 lb)    General appearance: alert, cooperative, appears stated age and no distress Head: Normocephalic, without obvious abnormality, atraumatic Resp: clear to auscultation bilaterally Cardio: regular rate and rhythm, S1, S2 normal, no murmur, click, rub or gallop GI: soft, non-tender; bowel sounds normal; no masses,  no organomegaly Extremities: Swelling noted around the right knee Neurologic: No focal deficits.  Lab Results:  Data Reviewed: I have personally reviewed following labs and imaging studies  CBC: Recent Labs  Lab 12/11/17 1038  WBC 6.2  NEUTROABS 4.0  HGB 13.8  HCT  39.5  MCV 97.1  PLT 274    Basic Metabolic Panel: Recent Labs  Lab 12/11/17 1038  NA 138  K 3.9  CL 104  CO2 21*  GLUCOSE 120*  BUN 21*  CREATININE 1.12  CALCIUM 9.0  MG 1.8    GFR: Estimated Creatinine Clearance: 127.3 mL/min (by C-G formula based on SCr of 1.12 mg/dL).   Recent Results (from the past 240 hour(s))  MRSA PCR Screening     Status: None   Collection Time: 12/13/17  9:51 AM  Result Value Ref Range Status   MRSA by PCR NEGATIVE NEGATIVE Final    Comment:        The GeneXpert MRSA Assay (FDA approved for NASAL specimens only), is one component of a comprehensive MRSA colonization surveillance program. It is not intended to diagnose MRSA infection nor to guide or monitor treatment for MRSA infections.       Radiology Studies: Ct Knee Right Wo Contrast  Result Date: 12/11/2017 CLINICAL DATA:  46 year old male with right knee periprosthetic fracture EXAM: CT OF THE RIGHT KNEE WITHOUT CONTRAST TECHNIQUE: Multidetector CT imaging of the RIGHT knee was performed according to the standard protocol. Multiplanar CT image reconstructions were also generated. COMPARISON:  Concurrently obtained radiographs 12/11/2017 FINDINGS: Comminuted and posteromedially displaced and tilted fracture through the distal femur just above the femoral condyles in the anterior aspect of the femoral components of the knee prosthesis. The distal fracture fragment is displaced by approximately 2.6 cm. The patella and tibia remain aligned with the  femoral condyles. Moderate associated hemarthrosis and extensive surrounding soft tissue swelling. Streak artifact from the arthroplasty prosthesis slightly limits evaluation of fine osseous detail. No definite evidence of periprosthetic fracture along the tibial component. The visualized fibula is within normal limits. Atherosclerotic calcifications are present in the runoff arteries. IMPRESSION: 1. Comminuted and posteromedially displaced distal  femoral periprosthetic fracture. Maximal displacement measures approximately 2.6 cm. 2. The patella and tibia remain aligned with the femoral condyles. 3. Moderate associated hemarthrosis and surrounding soft tissue swelling. 4. Atherosclerotic vascular calcifications. Electronically Signed   By: Malachy MoanHeath  McCullough M.D.   On: 12/11/2017 14:47   Dg Femur Min 2 Views Right  Result Date: 12/11/2017 CLINICAL DATA:  The patient suffered a periprosthetic distal right femur fracture in a fall today. Initial encounter. EXAM: RIGHT FEMUR 2 VIEWS COMPARISON:  Plain films right knee this same day. FINDINGS: Periprosthetic femur fracture is again identified. No other acute bony or joint abnormality is seen. Mild right hip osteoarthritis noted. IMPRESSION: Periprosthetic distal right femur fracture as seen on the comparison examination. No other acute abnormality. Electronically Signed   By: Drusilla Kannerhomas  Dalessio M.D.   On: 12/11/2017 14:47     Medications:  Scheduled: . amLODipine  10 mg Oral Daily  . carvedilol  25 mg Oral BID  . chlorhexidine  60 mL Topical Once  . hydrALAZINE  25 mg Oral BID WC  . lidocaine  1 patch Transdermal Q24H  . povidone-iodine  2 application Topical Once  . senna  2 tablet Oral QHS   Continuous: . sodium chloride 75 mL/hr at 12/12/17 0939  . clindamycin (CLEOCIN) IV     NWG:[id number]:diphenhydrAMINE, HYDROmorphone (DILAUDID) injection  Assessment/Plan:  Active Problems:   Other fracture of right femur, initial encounter for closed fracture (HCC)   Stress fracture, right femur, initial encounter for fracture    Acute comminuted periprosthetic fracture of the distal right femur Plan per orthopedics.  Mechanical fall at home Patient denies any dizziness or lightheadedness prior to his fall.  He was laughing hard and took a step, was not looking where he was going and tripped resulting in this fall.  No syncopal episode.  Does not need any further workup at this time.  History of  essential hypertension/Chronic Systolic CHF Continue home medications.  Monitor blood pressures closely.  Patient also mentions a history of CHF.  Last echocardiogram is from 2014 with an ejection fraction was noted to be 40-45%.  He has seen by Dr. Eldridge DaceVaranasi with cardiology previously. However does not follow up on a regular basis.  This apparently is managed by his PCP.  Takes diuretics on a daily basis.  Cardiac status appears to be stable.  EKG does not show any ischemic changes.  He has had 2 orthopedic procedures within the last one year without any complications.  Seems to be well compensated at this time.  Outpatient monitoring by PCP as before.  Check labs tomorrow.  Resume furosemide postoperatively.  Morbid Obesity Body mass index is 42.37 kg/m.   DVT Prophylaxis: SCDs    Code Status: Full code Family Communication: Discussed with patient Disposition Plan: Await surgical intervention.    LOS: 2 days   Osvaldo ShipperGokul Thomson Herbers  [facility] Pager [phone] 12/13/2017, 1:20 PM  If 7PM-7AM, please contact night-coverage at [email or web address], password [email or web address]

## 2017-12-13 NOTE — Transfer of Care (Signed)
Immediate Anesthesia Transfer of Care Note  Patient: Andre Hoffman  Procedure(s) Performed: INTRAMEDULLARY (IM) NAIL FEMORAL - Procedure cancelled (Right Hip)  Patient Location: PACU  Anesthesia Type:General  Level of Consciousness: awake, alert , oriented and patient cooperative  Airway & Oxygen Therapy: Patient Spontanous Breathing and Patient connected to nasal cannula oxygen  Post-op Assessment: Report given to RN and Post -op Vital signs reviewed and stable  Post vital signs: Reviewed and stable  Last Vitals:  Vitals:   12/13/17 1430 12/13/17 2106  BP: (!) 156/82 (!) 145/73  Pulse: 89 94  Resp: 17 19  Temp: 37.7 C (!) 38.8 C  SpO2: 100% 97%    Last Pain:  Vitals:   12/13/17 1605  TempSrc:   PainSc: 3          Complications: No apparent anesthesia complications

## 2017-12-13 NOTE — Anesthesia Procedure Notes (Signed)
Procedure Name: Intubation Date/Time: 12/13/2017 8:29 PM Performed by: Oletta Lamas, CRNA Pre-anesthesia Checklist: Patient identified, Emergency Drugs available, Suction available and Patient being monitored Patient Re-evaluated:Patient Re-evaluated prior to induction Oxygen Delivery Method: Circle System Utilized Preoxygenation: Pre-oxygenation with 100% oxygen Induction Type: IV induction Ventilation: Mask ventilation without difficulty Laryngoscope Size: Mac and 4 Grade View: Grade II Tube type: Oral Tube size: 7.5 mm Number of attempts: 1 Airway Equipment and Method: Stylet and Oral airway Placement Confirmation: ETT inserted through vocal cords under direct vision,  positive ETCO2 and breath sounds checked- equal and bilateral Secured at: 23 cm Tube secured with: Tape Dental Injury: Teeth and Oropharynx as per pre-operative assessment

## 2017-12-14 DIAGNOSIS — E860 Dehydration: Secondary | ICD-10-CM

## 2017-12-14 DIAGNOSIS — R509 Fever, unspecified: Secondary | ICD-10-CM

## 2017-12-14 DIAGNOSIS — D649 Anemia, unspecified: Secondary | ICD-10-CM

## 2017-12-14 LAB — URINALYSIS, ROUTINE W REFLEX MICROSCOPIC
Bacteria, UA: NONE SEEN
Bilirubin Urine: NEGATIVE
GLUCOSE, UA: NEGATIVE mg/dL
Hgb urine dipstick: NEGATIVE
KETONES UR: NEGATIVE mg/dL
LEUKOCYTES UA: NEGATIVE
Nitrite: NEGATIVE
PH: 6 (ref 5.0–8.0)
Protein, ur: 30 mg/dL — AB
SPECIFIC GRAVITY, URINE: 1.017 (ref 1.005–1.030)
SQUAMOUS EPITHELIAL / LPF: NONE SEEN

## 2017-12-14 LAB — COMPREHENSIVE METABOLIC PANEL WITH GFR
ALT: 17 U/L (ref 17–63)
AST: 25 U/L (ref 15–41)
Albumin: 3.2 g/dL — ABNORMAL LOW (ref 3.5–5.0)
Alkaline Phosphatase: 73 U/L (ref 38–126)
Anion gap: 9 (ref 5–15)
BUN: 13 mg/dL (ref 6–20)
CO2: 23 mmol/L (ref 22–32)
Calcium: 8.7 mg/dL — ABNORMAL LOW (ref 8.9–10.3)
Chloride: 104 mmol/L (ref 101–111)
Creatinine, Ser: 1.32 mg/dL — ABNORMAL HIGH (ref 0.61–1.24)
GFR calc Af Amer: >60 mL/min
GFR calc non Af Amer: >60 mL/min
Glucose, Bld: 258 mg/dL — ABNORMAL HIGH (ref 65–99)
Potassium: 3.6 mmol/L (ref 3.5–5.1)
Sodium: 136 mmol/L (ref 135–145)
Total Bilirubin: 1.4 mg/dL — ABNORMAL HIGH (ref 0.3–1.2)
Total Protein: 6.7 g/dL (ref 6.5–8.1)

## 2017-12-14 LAB — BASIC METABOLIC PANEL
ANION GAP: 7 (ref 5–15)
BUN: 13 mg/dL (ref 6–20)
CALCIUM: 8.6 mg/dL — AB (ref 8.9–10.3)
CO2: 24 mmol/L (ref 22–32)
Chloride: 107 mmol/L (ref 101–111)
Creatinine, Ser: 1.23 mg/dL (ref 0.61–1.24)
GFR calc Af Amer: >60 mL/min (ref >60)
GLUCOSE: 136 mg/dL — AB (ref 65–99)
Potassium: 3.9 mmol/L (ref 3.5–5.1)
Sodium: 138 mmol/L (ref 135–145)

## 2017-12-14 LAB — LACTIC ACID, PLASMA
Lactic Acid, Venous: 2 mmol/L (ref 0.5–1.9)
Lactic Acid, Venous: 1 mmol/L (ref 0.5–1.9)

## 2017-12-14 MED ORDER — SODIUM CHLORIDE 0.9 % IV BOLUS (SEPSIS)
500.0000 mL | Freq: Once | INTRAVENOUS | Status: AC
  Administered 2017-12-14: 500 mL via INTRAVENOUS

## 2017-12-14 MED ORDER — HYDRALAZINE HCL 25 MG PO TABS
25.0000 mg | ORAL_TABLET | Freq: Three times a day (TID) | ORAL | Status: DC
  Administered 2017-12-14 – 2017-12-20 (×19): 25 mg via ORAL
  Filled 2017-12-14 (×19): qty 1

## 2017-12-14 MED ORDER — ACETAMINOPHEN 325 MG PO TABS
650.0000 mg | ORAL_TABLET | Freq: Four times a day (QID) | ORAL | Status: DC | PRN
  Administered 2017-12-14 – 2017-12-16 (×5): 650 mg via ORAL
  Filled 2017-12-14 (×5): qty 2

## 2017-12-14 NOTE — Progress Notes (Signed)
   Subjective:  Patient reports pain as mild.  Doing well at this time.  Denies NS or chills.  Fever wu ongoing.  Objective:   VITALS:   Vitals:   12/13/17 2122 12/13/17 2231 12/14/17 0520 12/14/17 1025  BP: 137/67 139/63 (!) 157/70 (!) 170/88  Pulse: 90 100 90 87  Resp: 18 18 19    Temp: 99.9 F (37.7 C) (!) 100.7 F (38.2 C) 99.8 F (37.7 C)   TempSrc:  Oral Oral   SpO2: 96% 100% 100%   Weight:      Height:        Neurovascular intact Sensation intact distally Intact pulses distally Dorsiflexion/Plantar flexion intact Compartment soft   Lab Results  Component Value Date   WBC 10.3 12/13/2017   HGB 9.9 (L) 12/13/2017   HCT 29.8 (L) 12/13/2017   MCV 100.0 12/13/2017   PLT 201 12/13/2017   BMET    Component Value Date/Time   NA 136 12/13/2017 2324   K 3.6 12/13/2017 2324   CL 104 12/13/2017 2324   CO2 23 12/13/2017 2324   GLUCOSE 258 (H) 12/13/2017 2324   BUN 13 12/13/2017 2324   CREATININE 1.32 (H) 12/13/2017 2324   CALCIUM 8.7 (L) 12/13/2017 2324   GFRNONAA >60 12/13/2017 2324   GFRAA >60 12/13/2017 2324     Assessment/Plan: 1 Day Post-Op   Active Problems:   Other fracture of right femur, initial encounter for closed fracture (HCC)   Stress fracture, right femur, initial encounter for fracture  NWB RLE Awaiting final results of ongoing fever wu, which is negative thus far Will make NPO tonight in anticipation of surgical fixation tomorrow (Wednesday) SCDs for DVT ppx at this tmie.   Yolonda KidaJason Patrick Kanyah Matsushima 12/14/2017, 1:12 PM   Maryan RuedJason P Elyse Prevo, MD [phone]

## 2017-12-14 NOTE — Progress Notes (Signed)
[facility] PROGRESS NOTE  Mendel RyderJames II Steinmeyer ZOX:[id number] DOB: 1971/10/11 DOA: 12/11/2017  PCP: Georgann HousekeeperHusain, Karrar, MD  Brief History/Interval Summary: 46 year old male with a past medical history of hypertension, obesity, osteoarthritis previous left hip replacement in 2017 and right knee replacement in January 2018 presented after sustaining a mechanical fall while at work.  This resulted in distal right femur fracture.  Reason for Visit: Periprosthetic fracture of the distal right femur  Consultants: Orthopedics  Procedures: None yet  Antibiotics: None  Subjective/Interval History: Patient denies any complaints except for some pain in his right leg.  Disappointed that he could not undergo surgery yesterday.  Patient was noted to have a fever when he was in the operating room.  Patient denies any cough, dysuria, diarrhea, skin rashes.  Denies any headaches.  No runny nose.  No sick exposure.    ROS: Denies any nausea vomiting.  Objective:  Vital Signs  Vitals:   12/13/17 2115 12/13/17 2122 12/13/17 2231 12/14/17 0520  BP: (!) 145/72 137/67 139/63 (!) 157/70  Pulse: 89 90 100 90  Resp: 15 18 18 19   Temp:  99.9 F (37.7 C) (!) 100.7 F (38.2 C) 99.8 F (37.7 C)  TempSrc:   Oral Oral  SpO2: 97% 96% 100% 100%  Weight:      Height:        Intake/Output Summary (Last 24 hours) at 12/14/2017 0942 Last data filed at 12/14/2017 0850 Gross per 24 hour  Intake 1555 ml  Output 1900 ml  Net -345 ml   Filed Weights   12/11/17 1010  Weight: (!) 149.7 kg (330 lb)    General appearance: Awake alert.  In no distress. Resp: Lungs are clear to auscultation bilaterally. Cardio: S1-S2 is normal regular.  No S3-S4.  No rubs murmurs or bruit. GI: Abdomen is soft.  Nontender nondistended.  Bowel sounds are present.  No masses organomegaly Extremities: Right knee noted to be swollen.  No erythema. Neurologic: No obvious focal neurological deficits.  Lab Results:  Data  Reviewed: I have personally reviewed following labs and imaging studies  CBC: Recent Labs  Lab 12/11/17 1038 12/13/17 2324  WBC 6.2 10.3  NEUTROABS 4.0 8.4*  HGB 13.8 9.9*  HCT 39.5 29.8*  MCV 97.1 100.0  PLT 274 201    Basic Metabolic Panel: Recent Labs  Lab 12/11/17 1038 12/13/17 2324  NA 138 136  K 3.9 3.6  CL 104 104  CO2 21* 23  GLUCOSE 120* 258*  BUN 21* 13  CREATININE 1.12 1.32*  CALCIUM 9.0 8.7*  MG 1.8  --     GFR: Estimated Creatinine Clearance: 108 mL/min (A) (by C-G formula based on SCr of 1.32 mg/dL (H)).   Recent Results (from the past 240 hour(s))  MRSA PCR Screening     Status: None   Collection Time: 12/13/17  9:51 AM  Result Value Ref Range Status   MRSA by PCR NEGATIVE NEGATIVE Final    Comment:        The GeneXpert MRSA Assay (FDA approved for NASAL specimens only), is one component of a comprehensive MRSA colonization surveillance program. It is not intended to diagnose MRSA infection nor to guide or monitor treatment for MRSA infections.       Radiology Studies: Dg Chest Port 1 View  Result Date: 12/13/2017 CLINICAL DATA:  Fever EXAM: PORTABLE CHEST 1 VIEW COMPARISON:  None. FINDINGS: Low lung volumes. No consolidation or effusion. Borderline cardiomegaly. No pneumothorax. Advanced arthritis at the right shoulder.  IMPRESSION: Low lung volumes with borderline cardiomegaly. No focal pulmonary opacity is visualized. Electronically Signed   By: Jasmine PangKim  Fujinaga M.D.   On: 12/13/2017 21:32     Medications:  Scheduled: . amLODipine  10 mg Oral Daily  . carvedilol  25 mg Oral BID  . enoxaparin (LOVENOX) injection  75 mg Subcutaneous Q24H  . hydrALAZINE  25 mg Oral BID WC  . lidocaine  1 patch Transdermal Q24H  . senna  2 tablet Oral QHS   Continuous: . sodium chloride 75 mL/hr at 12/13/17 1800   ZOX:[id number]:diphenhydrAMINE, HYDROmorphone (DILAUDID) injection  Assessment/Plan:  Active Problems:   Other fracture of right femur, initial  encounter for closed fracture [facility](HCC)   Stress fracture, right femur, initial encounter for fracture   Fever/dehydration Patient noted to have a fever last night with a temperature of 101.8.  Surgery had to be canceled.  Etiology of the fever is not entirely clear.  Patient underwent infectious workup last night.  Chest x-ray did not show any pneumonia.  Patient denies any complaints whatsoever except for the pain in his right leg from fracture.  Lactic acid level noted to be 2.0 last night.  Subsequently 1.0.  WBC 10.3.  UA is pending although patient denies any dysuria.  Does mention dark urine.  Creatinine was noted to be mildly elevated likely because he was n.p.o. for all day yesterday.  He has been hydrated.  We will repeat labs later today.  Monitor urine output.  Normocytic anemia Drop in hemoglobin noted.  Hemoglobin actually quite similar to what it was back in January.  So his initial value could have been hemoconcentrated.  He could have also had some bleeding in his fracture location.  Hemodynamically stable.  We will repeat labs tomorrow.  Acute comminuted periprosthetic fracture of the distal right femur Plan per orthopedics.  Mechanical fall at home Patient denies any dizziness or lightheadedness prior to his fall.  He was laughing hard and took a step, was not looking where he was going, and tripped resulting in this fall.  No syncopal episode.  Does not need any further workup at this time.  History of essential hypertension/Chronic Systolic CHF Continue home medications.  Monitor blood pressures closely.  Patient also mentions a history of CHF.  Last echocardiogram is from 2014 with an ejection fraction was noted to be 40-45%.  He has seen by Dr. Eldridge DaceVaranasi with cardiology previously. However does not follow up on a regular basis.  This apparently is managed by his PCP.  Takes diuretics on a daily basis.  Cardiac status appears to be stable.  EKG does not show any ischemic changes.   He has had 2 orthopedic procedures within the last one year without any complications.  Seems to be well compensated at this time.  Outpatient monitoring by PCP as before. Resume furosemide postoperatively.  Morbid Obesity Body mass index is 42.37 kg/m.   DVT Prophylaxis: SCDs    Code Status: Full code Family Communication: Discussed with patient Disposition Plan: Await surgical intervention.    LOS: 3 days   Osvaldo ShipperGokul Ainsley Sanguinetti  [facility] Pager [phone] 12/14/2017, 9:42 AM  If 7PM-7AM, please contact night-coverage at [email or web address], password [email or web address]

## 2017-12-15 ENCOUNTER — Inpatient Hospital Stay (HOSPITAL_COMMUNITY): Payer: 59

## 2017-12-15 DIAGNOSIS — R609 Edema, unspecified: Secondary | ICD-10-CM

## 2017-12-15 LAB — HEPATIC FUNCTION PANEL
ALBUMIN: 2.7 g/dL — AB (ref 3.5–5.0)
ALT: 21 U/L (ref 17–63)
AST: 22 U/L (ref 15–41)
Alkaline Phosphatase: 75 U/L (ref 38–126)
BILIRUBIN DIRECT: 0.1 mg/dL (ref 0.1–0.5)
BILIRUBIN TOTAL: 1.1 mg/dL (ref 0.3–1.2)
Indirect Bilirubin: 1 mg/dL — ABNORMAL HIGH (ref 0.3–0.9)
Total Protein: 6.4 g/dL — ABNORMAL LOW (ref 6.5–8.1)

## 2017-12-15 LAB — CBC
HEMATOCRIT: 27.5 % — AB (ref 39.0–52.0)
HEMOGLOBIN: 8.9 g/dL — AB (ref 13.0–17.0)
MCH: 32.4 pg (ref 26.0–34.0)
MCHC: 32.4 g/dL (ref 30.0–36.0)
MCV: 100 fL (ref 78.0–100.0)
Platelets: 221 10*3/uL (ref 150–400)
RBC: 2.75 MIL/uL — ABNORMAL LOW (ref 4.22–5.81)
RDW: 14.1 % (ref 11.5–15.5)
WBC: 10.2 10*3/uL (ref 4.0–10.5)

## 2017-12-15 LAB — URINE CULTURE: CULTURE: NO GROWTH

## 2017-12-15 LAB — BASIC METABOLIC PANEL
Anion gap: 7 (ref 5–15)
BUN: 14 mg/dL (ref 6–20)
CHLORIDE: 108 mmol/L (ref 101–111)
CO2: 23 mmol/L (ref 22–32)
CREATININE: 1.12 mg/dL (ref 0.61–1.24)
Calcium: 8.5 mg/dL — ABNORMAL LOW (ref 8.9–10.3)
GFR calc Af Amer: >60 mL/min (ref >60)
GFR calc non Af Amer: >60 mL/min (ref >60)
Glucose, Bld: 131 mg/dL — ABNORMAL HIGH (ref 65–99)
Potassium: 3.9 mmol/L (ref 3.5–5.1)
Sodium: 138 mmol/L (ref 135–145)

## 2017-12-15 LAB — INFLUENZA PANEL BY PCR (TYPE A & B)
Influenza A By PCR: NEGATIVE
Influenza B By PCR: NEGATIVE

## 2017-12-15 LAB — HEPARIN LEVEL (UNFRACTIONATED)

## 2017-12-15 MED ORDER — OXYCODONE HCL 5 MG PO TABS
5.0000 mg | ORAL_TABLET | ORAL | Status: DC | PRN
  Administered 2017-12-15 – 2017-12-20 (×10): 5 mg via ORAL
  Filled 2017-12-15 (×11): qty 1

## 2017-12-15 MED ORDER — HEPARIN (PORCINE) IN NACL 100-0.45 UNIT/ML-% IJ SOLN
2400.0000 [IU]/h | INTRAMUSCULAR | Status: DC
  Administered 2017-12-15: 1650 [IU]/h via INTRAVENOUS
  Administered 2017-12-15: 1850 [IU]/h via INTRAVENOUS
  Administered 2017-12-16: 2250 [IU]/h via INTRAVENOUS
  Administered 2017-12-16: 1850 [IU]/h via INTRAVENOUS
  Filled 2017-12-15 (×4): qty 250

## 2017-12-15 MED ORDER — ACETAMINOPHEN 325 MG PO TABS
325.0000 mg | ORAL_TABLET | ORAL | Status: AC
  Administered 2017-12-15: 325 mg via ORAL
  Filled 2017-12-15: qty 1

## 2017-12-15 MED ORDER — HEPARIN BOLUS VIA INFUSION
5000.0000 [IU] | Freq: Once | INTRAVENOUS | Status: AC
  Administered 2017-12-15: 5000 [IU] via INTRAVENOUS
  Filled 2017-12-15: qty 5000

## 2017-12-15 MED ORDER — HEPARIN BOLUS VIA INFUSION
2000.0000 [IU] | Freq: Once | INTRAVENOUS | Status: AC
  Administered 2017-12-15: 2000 [IU] via INTRAVENOUS
  Filled 2017-12-15: qty 2000

## 2017-12-15 MED ORDER — SODIUM CHLORIDE 0.9 % IV BOLUS (SEPSIS)
250.0000 mL | Freq: Once | INTRAVENOUS | Status: AC
Start: 2017-12-15 — End: 2017-12-15
  Administered 2017-12-15: 250 mL via INTRAVENOUS

## 2017-12-15 NOTE — Progress Notes (Signed)
Patient stated that he has pain in joints in his fingers, wrists, and elbows. Hx of arthritis and gout.  Stated that this has happened once before and that it went away after a few days with only taking ibuprofen PRN. Hand grips bilaterally are moderate. Slight weakness due to pain. Informed Dr. Rito EhrlichKrishnan.

## 2017-12-15 NOTE — Progress Notes (Signed)
*  Preliminary Results* Bilateral lower extremity venous duplex completed. The right lower extremity is positive for acute deep vein thrombosis involving the right popliteal, posterior tibial, and peroneal veins. The left lower extremity is negative for deep vein thrombosis. There is no evidence of Baker's cyst bilaterally.  Preliminary results discussed with Dr. Rito EhrlichKrishnan.  12/15/2017 1:23 PM Gertie FeyMichelle Shawanda Sievert, BS, RVT, RDCS, RDMS

## 2017-12-15 NOTE — Progress Notes (Signed)
Spoke with OR concerning patient US results being positive for DVT.  Dr. Rito EhrlichKrishnan had called previously and informed. Order already placed by Dr. Rito EhrlichKrishnan for Heparin gtt per pharmacy

## 2017-12-15 NOTE — Progress Notes (Addendum)
[facility] PROGRESS NOTE  Andre Hoffman:[id number] DOB: 1971-01-22 DOA: 12/11/2017  PCP: Georgann HousekeeperHusain, Karrar, MD  Brief History/Interval Summary: 46 year old male with a past medical history of hypertension, obesity, osteoarthritis previous left hip replacement in 2017 and right knee replacement in January 2018 presented after sustaining a mechanical fall while at work.  This resulted in distal right femur fracture.  Reason for Visit: Periprosthetic fracture of the distal right femur  Consultants: Orthopedics  Procedures: None yet  Antibiotics: None  Subjective/Interval History: Patient again developed fever last night.  Continues to deny any symptoms.  Pain in the right leg is well controlled.  No cough.  Denies any urinary complaints.  No diarrhea.    ROS: Denies any nausea or vomiting  Objective:  Vital Signs  Vitals:   12/14/17 2225 12/15/17 0100 12/15/17 0524 12/15/17 0727  BP: (!) 149/70  (!) 159/80 (!) 153/73  Pulse:   88   Resp:   18   Temp: (!) 100.8 F (38.2 C) (!) 101.5 F (38.6 C) 99.8 F (37.7 C) 99.9 F (37.7 C)  TempSrc: Oral Oral Oral Oral  SpO2:   97%   Weight:      Height:        Intake/Output Summary (Last 24 hours) at 12/15/2017 0932 Last data filed at 12/15/2017 0816 Gross per 24 hour  Intake 2280 ml  Output 850 ml  Net 1430 ml   Filed Weights   12/11/17 1010  Weight: (!) 149.7 kg (330 lb)    General appearance: Awake alert.  In no distress. Resp: Lungs are clear to auscultation bilaterally.  No wheezing rales or rhonchi Cardio: S1-S2 is normal regular.  No S3-S4.  No rubs murmurs or bruit. GI: Abdomen is soft.  Nontender nondistended.  Bowel sounds are present.  No masses organomegaly  Extremities: Right knee continues to be swollen.  Does not appear to be any more warm to touch compared to the left. No skin rashes Neurologic: No focal neurological deficits.  Lab Results:  Data Reviewed: I have personally reviewed  following labs and imaging studies  CBC: Recent Labs  Lab 12/11/17 1038 12/13/17 2324 12/15/17 0735  WBC 6.2 10.3 10.2  NEUTROABS 4.0 8.4*  --   HGB 13.8 9.9* 8.9*  HCT 39.5 29.8* 27.5*  MCV 97.1 100.0 100.0  PLT 274 201 221    Basic Metabolic Panel: Recent Labs  Lab 12/11/17 1038 12/13/17 2324 12/14/17 1810 12/15/17 0735  NA 138 136 138 138  K 3.9 3.6 3.9 3.9  CL 104 104 107 108  CO2 21* 23 24 23   GLUCOSE 120* 258* 136* 131*  BUN 21* 13 13 14   CREATININE 1.12 1.32* 1.23 1.12  CALCIUM 9.0 8.7* 8.6* 8.5*  MG 1.8  --   --   --     GFR: Estimated Creatinine Clearance: 127.3 mL/min (by C-G formula based on SCr of 1.12 mg/dL).   Recent Results (from the past 240 hour(s))  MRSA PCR Screening     Status: None   Collection Time: 12/13/17  9:51 AM  Result Value Ref Range Status   MRSA by PCR NEGATIVE NEGATIVE Final    Comment:        The GeneXpert MRSA Assay (FDA approved for NASAL specimens only), is one component of a comprehensive MRSA colonization surveillance program. It is not intended to diagnose MRSA infection nor to guide or monitor treatment for MRSA infections.   Culture, Urine     Status: None  Collection Time: 12/14/17  8:50 AM  Result Value Ref Range Status   Specimen Description URINE, CATHETERIZED  Final   Special Requests NONE  Final   Culture NO GROWTH  Final   Report Status 12/15/2017 FINAL  Final      Radiology Studies: Dg Chest Port 1 View  Result Date: 12/13/2017 CLINICAL DATA:  Fever EXAM: PORTABLE CHEST 1 VIEW COMPARISON:  None. FINDINGS: Low lung volumes. No consolidation or effusion. Borderline cardiomegaly. No pneumothorax. Advanced arthritis at the right shoulder. IMPRESSION: Low lung volumes with borderline cardiomegaly. No focal pulmonary opacity is visualized. Electronically Signed   By: Jasmine Pang M.D.   On: 12/13/2017 21:32     Medications:  Scheduled: . amLODipine  10 mg Oral Daily  . carvedilol  25 mg Oral BID    . enoxaparin (LOVENOX) injection  75 mg Subcutaneous Q24H  . hydrALAZINE  25 mg Oral Q8H  . lidocaine  1 patch Transdermal Q24H  . senna  2 tablet Oral QHS   Continuous: . sodium chloride 100 mL/hr at 12/15/17 1610   RUE:[id number], diphenhydrAMINE, HYDROmorphone (DILAUDID) injection  Assessment/Plan:  Active Problems:   Other fracture of right femur, initial encounter for closed fracture (HCC)   Stress fracture, right femur, initial encounter for fracture   Fever/dehydration Patient again developed a temperature of 102.4 F yesterday.  Patient remains asymptomatic.  Infectious workup has been negative so far.  WBC remains normal.  UA did not show any abnormalities concerning for infection.  Influenza PCR was normal.  Discussed with Dr. Linna Caprice with orthopedics.  He does not feel that the source is the right lower extremity.  He raised concern for PE.  However considering that the patient does not have any symptoms suggestive of same, PE is thought to be unlikely.  Patient has normal heart rate, is not hypoxic, does not complain of shortness of breath or chest pain and does not have any cough.  Lower extremity Doppler study is ordered to rule out DVT.  Continue to monitor for now.  Blood cultures remain negative.    Normocytic anemia Drop in hemoglobin noted.  Hemoglobin actually quite similar to what it was back in January.  So his initial value could have been hemoconcentrated.  He could have also had some bleeding in his fracture location.  Hemodynamically stable.  Hemoglobin noted to be slightly lower on today's labs.  No overt bleeding.  Acute comminuted periprosthetic fracture of the distal right femur Plan per orthopedics.  Mechanical fall at home Patient denies any dizziness or lightheadedness prior to his fall.  He was laughing hard and took a step, was not looking where he was going, and tripped resulting in this fall.  No syncopal episode.  Does not need any further workup  at this time.  History of essential hypertension/Chronic Systolic CHF Continue home medications.  Monitor blood pressures closely.  Patient also mentions a history of CHF.  Last echocardiogram is from 2014 with an ejection fraction was noted to be 40-45%.  He has seen by Dr. Eldridge Dace with cardiology previously. However does not follow up on a regular basis.  This apparently is managed by his PCP.  Takes diuretics on a daily basis.  Cardiac status appears to be stable.  EKG does not show any ischemic changes.  He has had 2 orthopedic procedures within the last one year without any complications.  Seems to be well compensated at this time.  Outpatient monitoring by PCP as before. Resume furosemide postoperatively.  Morbid Obesity Body mass index is 42.37 kg/m.  ADDENDUM Doppler study is positive for DVT in the right lower extremity.  This would explain his fever.  We will initiate anticoagulation.  IV heparin for now.  Once he has had a surgery he can be transitioned to oral anticoagulants.   DVT Prophylaxis: SCDs    Code Status: Full code Family Communication: Discussed with patient Disposition Plan: Workup in progress for fever.    LOS: 4 days   Andre Hoffman  [facility] Pager [phone] 12/15/2017, 9:32 AM  If 7PM-7AM, please contact night-coverage at [email or web address], password [email or web address]

## 2017-12-15 NOTE — Progress Notes (Signed)
ANTICOAGULATION CONSULT NOTE - Initial Consult  Pharmacy Consult:  Heparin Indication:  Acute RLE DVT  Allergies  Allergen Reactions  . Lisinopril Swelling    Angioedema  . Penicillins Swelling    SWELLING REACTION UNSPECIFIED as a child  Has patient had a PCN reaction causing immediate rash, facial/tongue/throat swelling, SOB or lightheadedness with hypotension: Unknown Has patient had a PCN reaction causing severe rash involving mucus membranes or skin necrosis: Unknown Has patient had a PCN reaction that required hospitalization: Unknown Has patient had a PCN reaction occurring within the last 10 years: No If all of the above answers are "NO", then may proceed with Cephalosporin use.      Patient Measurements: Height: 6\' 2"  (188 cm) Weight: (!) 330 lb (149.7 kg) IBW/kg (Calculated) : 82.2 Heparin Dosing Weight: 117 kg  Vital Signs: Temp: 102.2 F (39 C) (12/26 1900) Temp Source: Oral (12/26 1900) BP: 146/64 (12/26 1900) Pulse Rate: 91 (12/26 1900)  Labs: Recent Labs    12/13/17 2324 12/14/17 1810 12/15/17 0735 12/15/17 2059  HGB 9.9*  --  8.9*  --   HCT 29.8*  --  27.5*  --   PLT 201  --  221  --   HEPARINUNFRC  --   --   --  <0.10*  CREATININE 1.32* 1.23 1.12  --     Estimated Creatinine Clearance: 127.3 mL/min (by C-G formula based on SCr of 1.12 mg/dL).   Medical History: Past Medical History:  Diagnosis Date  . Arthritis    OSTEO  . CHF (congestive heart failure) (HCC)    Echo- 2014- EF 40-45%; mild LVH.Takes Lasix daily  . History of bronchitis    3 yrs ago  . History of gout    only once   . Hypertension    takes Coreg and Losartan daily  . Joint pain   . White coat hypertension      Assessment: [age] YOM presented s/p fall with femur fracture.  Prophylactic Lovenox ordered for 2 doses prior to surgery today 12/15/17, but med was not charted as given.  Now with acute RLE DVT and Pharmacy consulted to initiate IV heparin.  Baseline labs  reviewed. Heparin drip 1650 uts/hr running with no issues per RN with HL < 0.1.    Goal of Therapy:  Heparin level 0.3-0.7 units/ml Monitor platelets by anticoagulation protocol: Yes    Plan:  Heparin 2000 units IV bolus x 1, then  Increase Heparin gtt to 1850 units/hr Check 6 hr heparin level Daily heparin level and CBC   Leota SauersLisa Maisa Bedingfield Pharm.D. CPP, BCPS Clinical Pharmacist [phone] 12/15/2017 10:33 PM

## 2017-12-15 NOTE — Progress Notes (Signed)
Diagnosed with RLE DVT. Hospitalist wants to wait at least 24 hrs on heparin gtt. Will plan for surgery Friday am. NPO after MN Thursday night.  Hold heparin gtt after 1 am on Friday am.

## 2017-12-15 NOTE — Progress Notes (Signed)
ANTICOAGULATION CONSULT NOTE - Initial Consult  Pharmacy Consult:  Heparin Indication:  Acute RLE DVT  Allergies  Allergen Reactions  . Lisinopril Swelling    Angioedema  . Penicillins Swelling    SWELLING REACTION UNSPECIFIED as a child  Has patient had a PCN reaction causing immediate rash, facial/tongue/throat swelling, SOB or lightheadedness with hypotension: Unknown Has patient had a PCN reaction causing severe rash involving mucus membranes or skin necrosis: Unknown Has patient had a PCN reaction that required hospitalization: Unknown Has patient had a PCN reaction occurring within the last 10 years: No If all of the above answers are "NO", then may proceed with Cephalosporin use.      Patient Measurements: Height: 6\' 2"  (188 cm) Weight: (!) 330 lb (149.7 kg) IBW/kg (Calculated) : 82.2 Heparin Dosing Weight: 117 kg  Vital Signs: Temp: 99.9 F (37.7 C) (12/26 0727) Temp Source: Oral (12/26 0727) BP: 153/73 (12/26 0727) Pulse Rate: 88 (12/26 0524)  Labs: Recent Labs    12/13/17 2324 12/14/17 1810 12/15/17 0735  HGB 9.9*  --  8.9*  HCT 29.8*  --  27.5*  PLT 201  --  221  CREATININE 1.32* 1.23 1.12    Estimated Creatinine Clearance: 127.3 mL/min (by C-G formula based on SCr of 1.12 mg/dL).   Medical History: Past Medical History:  Diagnosis Date  . Arthritis    OSTEO  . CHF (congestive heart failure) (HCC)    Echo- 2014- EF 40-45%; mild LVH.Takes Lasix daily  . History of bronchitis    3 yrs ago  . History of gout    only once   . Hypertension    takes Coreg and Losartan daily  . Joint pain   . White coat hypertension      Assessment: [age] YOM presented s/p fall with femur fracture.  Prophylactic Lovenox ordered for 2 doses prior to surgery today 12/15/17, but med was not charted as given.  Now with acute RLE DVT and Pharmacy consulted to initiate IV heparin.  Baseline labs reviewed.   Goal of Therapy:  Heparin level 0.3-0.7 units/ml Monitor  platelets by anticoagulation protocol: Yes    Plan:  Heparin 5000 units IV bolus x 1, then  Heparin gtt at 1650 units/hr Check 6 hr heparin level Daily heparin level and CBC   Tyqwan Pink D. Laney Potashang, PharmD, BCPS Pager:  [phone] 12/15/2017, 1:54 PM

## 2017-12-16 DIAGNOSIS — I82491 Acute embolism and thrombosis of other specified deep vein of right lower extremity: Secondary | ICD-10-CM

## 2017-12-16 DIAGNOSIS — M130 Polyarthritis, unspecified: Secondary | ICD-10-CM

## 2017-12-16 LAB — COMPREHENSIVE METABOLIC PANEL
ALBUMIN: 2.7 g/dL — AB (ref 3.5–5.0)
ALK PHOS: 77 U/L (ref 38–126)
ALT: 19 U/L (ref 17–63)
ANION GAP: 8 (ref 5–15)
AST: 20 U/L (ref 15–41)
BILIRUBIN TOTAL: 1 mg/dL (ref 0.3–1.2)
BUN: 13 mg/dL (ref 6–20)
CALCIUM: 8.5 mg/dL — AB (ref 8.9–10.3)
CO2: 23 mmol/L (ref 22–32)
Chloride: 105 mmol/L (ref 101–111)
Creatinine, Ser: 1.09 mg/dL (ref 0.61–1.24)
GFR calc non Af Amer: >60 mL/min (ref >60)
Glucose, Bld: 130 mg/dL — ABNORMAL HIGH (ref 65–99)
POTASSIUM: 3.9 mmol/L (ref 3.5–5.1)
SODIUM: 136 mmol/L (ref 135–145)
TOTAL PROTEIN: 6.6 g/dL (ref 6.5–8.1)

## 2017-12-16 LAB — IRON AND TIBC
IRON: 9 ug/dL — AB (ref 45–182)
Saturation Ratios: 4 % — ABNORMAL LOW (ref 17.9–39.5)
TIBC: 221 ug/dL — AB (ref 250–450)
UIBC: 212 ug/dL

## 2017-12-16 LAB — URIC ACID: Uric Acid, Serum: 6.7 mg/dL (ref 4.4–7.6)

## 2017-12-16 LAB — FERRITIN: Ferritin: 680 ng/mL — ABNORMAL HIGH (ref 24–336)

## 2017-12-16 LAB — CBC
HCT: 26 % — ABNORMAL LOW (ref 39.0–52.0)
HEMOGLOBIN: 9.2 g/dL — AB (ref 13.0–17.0)
MCH: 35 pg — AB (ref 26.0–34.0)
MCHC: 35.4 g/dL (ref 30.0–36.0)
MCV: 98.9 fL (ref 78.0–100.0)
Platelets: 283 10*3/uL (ref 150–400)
RBC: 2.63 MIL/uL — ABNORMAL LOW (ref 4.22–5.81)
RDW: 14 % (ref 11.5–15.5)
WBC: 11.1 10*3/uL — ABNORMAL HIGH (ref 4.0–10.5)

## 2017-12-16 LAB — RETICULOCYTES
RBC.: 2.63 MIL/uL — ABNORMAL LOW (ref 4.22–5.81)
RETIC COUNT ABSOLUTE: 86.8 10*3/uL (ref 19.0–186.0)
Retic Ct Pct: 3.3 % — ABNORMAL HIGH (ref 0.4–3.1)

## 2017-12-16 LAB — FOLATE: FOLATE: 6.3 ng/mL (ref >5.9)

## 2017-12-16 LAB — HEPARIN LEVEL (UNFRACTIONATED)

## 2017-12-16 LAB — SEDIMENTATION RATE: SED RATE: 118 mm/h — AB (ref 0–16)

## 2017-12-16 LAB — VITAMIN B12: Vitamin B-12: 486 pg/mL (ref 180–914)

## 2017-12-16 LAB — C-REACTIVE PROTEIN: CRP: 27 mg/dL — AB (ref <1.0)

## 2017-12-16 MED ORDER — IBUPROFEN 200 MG PO TABS
600.0000 mg | ORAL_TABLET | Freq: Three times a day (TID) | ORAL | Status: AC
  Administered 2017-12-16 (×3): 600 mg via ORAL
  Filled 2017-12-16 (×3): qty 3

## 2017-12-16 MED ORDER — HEPARIN BOLUS VIA INFUSION
3000.0000 [IU] | Freq: Once | INTRAVENOUS | Status: AC
  Administered 2017-12-16: 3000 [IU] via INTRAVENOUS
  Filled 2017-12-16: qty 3000

## 2017-12-16 MED ORDER — PREDNISONE 20 MG PO TABS
20.0000 mg | ORAL_TABLET | Freq: Two times a day (BID) | ORAL | Status: DC
  Administered 2017-12-16 – 2017-12-18 (×5): 20 mg via ORAL
  Filled 2017-12-16 (×5): qty 1

## 2017-12-16 NOTE — Anesthesia Postprocedure Evaluation (Signed)
Anesthesia Post Note  Patient: Andre Hoffman  Procedure(s) Performed: INTRAMEDULLARY (IM) NAIL FEMORAL (canceled)     Patient location during evaluation: PACU Anesthesia Type: General Level of consciousness: awake and alert Pain management: pain level controlled Vital Signs Assessment: post-procedure vital signs reviewed and stable Respiratory status: spontaneous breathing, nonlabored ventilation, respiratory function stable and patient connected to nasal cannula oxygen Cardiovascular status: blood pressure returned to baseline and stable Postop Assessment: no apparent nausea or vomiting Anesthetic complications: no    Last Vitals:  Vitals:   12/15/17 1900 12/16/17 0459  BP: (!) 146/64 (!) 157/77  Pulse: 91 84  Resp: 18 20  Temp: (!) 39 C (!) 38.4 C  SpO2: 96% 98%    Last Pain:  Vitals:   12/16/17 0600  TempSrc:   PainSc: Asleep                 Theodosia Bahena S

## 2017-12-16 NOTE — Progress Notes (Signed)
ANTICOAGULATION CONSULT NOTE  Pharmacy Consult:  Heparin Indication:  Acute RLE DVT  Allergies  Allergen Reactions  . Lisinopril Swelling    Angioedema  . Penicillins Swelling    SWELLING REACTION UNSPECIFIED as a child  Has patient had a PCN reaction causing immediate rash, facial/tongue/throat swelling, SOB or lightheadedness with hypotension: Unknown Has patient had a PCN reaction causing severe rash involving mucus membranes or skin necrosis: Unknown Has patient had a PCN reaction that required hospitalization: Unknown Has patient had a PCN reaction occurring within the last 10 years: No If all of the above answers are "NO", then may proceed with Cephalosporin use.      Patient Measurements: Height: 6\' 2"  (188 cm) Weight: (!) 330 lb (149.7 kg) IBW/kg (Calculated) : 82.2 Heparin Dosing Weight: 117 kg  Vital Signs: Temp: 99.9 F (37.7 C) (12/27 1630) Temp Source: Oral (12/27 1630) BP: 181/81 (12/27 1630) Pulse Rate: 94 (12/27 1630)  Labs: Recent Labs    12/13/17 2324 12/14/17 1810 12/15/17 0735 12/15/17 2059 12/16/17 0816 12/16/17 1550  HGB 9.9*  --  8.9*  --  9.2*  --   HCT 29.8*  --  27.5*  --  26.0*  --   PLT 201  --  221  --  283  --   HEPARINUNFRC  --   --   --  <0.10* <0.10* <0.10*  CREATININE 1.32* 1.23 1.12  --  1.09  --     Estimated Creatinine Clearance: 130.8 mL/min (by C-G formula based on SCr of 1.09 mg/dL).   Assessment: [age] YOM presented s/p fall with femur fracture.  Now with acute RLE DVT and Pharmacy consulted to manage IV heparin.  Heparin level is undetectable.  No issue with heparin infusion per RN.  No bleeding reported.  To be off 0100 for surgery tomorrow  Goal of Therapy:  Heparin level 0.3-0.7 units/ml Monitor platelets by anticoagulation protocol: Yes   Plan:  Increase heparin gtt to 2400 units/hr.  Stop on 12/28 at 0100 per Ortho. F/u hep restart No need for another level prior to surgery  Isaac BlissMichael Claudett Bayly, PharmD, BCPS,  BCCCP Clinical Pharmacist Clinical phone for 12/16/2017 from 1430 [phone]: [phone] If after 2300, please call main pharmacy at: [phone] 12/16/2017 7:05 PM

## 2017-12-16 NOTE — Progress Notes (Signed)
ANTICOAGULATION CONSULT NOTE  Pharmacy Consult:  Heparin Indication:  Acute RLE DVT  Allergies  Allergen Reactions  . Lisinopril Swelling    Angioedema  . Penicillins Swelling    SWELLING REACTION UNSPECIFIED as a child  Has patient had a PCN reaction causing immediate rash, facial/tongue/throat swelling, SOB or lightheadedness with hypotension: Unknown Has patient had a PCN reaction causing severe rash involving mucus membranes or skin necrosis: Unknown Has patient had a PCN reaction that required hospitalization: Unknown Has patient had a PCN reaction occurring within the last 10 years: No If all of the above answers are "NO", then may proceed with Cephalosporin use.      Patient Measurements: Height: 6\' 2"  (188 cm) Weight: (!) 330 lb (149.7 kg) IBW/kg (Calculated) : 82.2 Heparin Dosing Weight: 117 kg  Vital Signs: Temp: 101.2 F (38.4 C) (12/27 0459) Temp Source: Oral (12/27 0459) BP: 157/77 (12/27 0459) Pulse Rate: 84 (12/27 0459)  Labs: Recent Labs    12/13/17 2324 12/14/17 1810 12/15/17 0735 12/15/17 2059 12/16/17 0816  HGB 9.9*  --  8.9*  --  9.2*  HCT 29.8*  --  27.5*  --  26.0*  PLT 201  --  221  --  283  HEPARINUNFRC  --   --   --  <0.10* <0.10*  CREATININE 1.32* 1.23 1.12  --  1.09    Estimated Creatinine Clearance: 130.8 mL/min (by C-G formula based on SCr of 1.09 mg/dL).   Medical History: Past Medical History:  Diagnosis Date  . Arthritis    OSTEO  . CHF (congestive heart failure) (HCC)    Echo- 2014- EF 40-45%; mild LVH.Takes Lasix daily  . History of bronchitis    3 yrs ago  . History of gout    only once   . Hypertension    takes Coreg and Losartan daily  . Joint pain   . White coat hypertension      Assessment: [age] YOM presented s/p fall with femur fracture.  Now with acute RLE DVT and Pharmacy consulted to manage IV heparin.  Heparin level is undetectable.  No issue with heparin infusion per RN.  No bleeding reported.   Goal  of Therapy:  Heparin level 0.3-0.7 units/ml Monitor platelets by anticoagulation protocol: Yes    Plan:  Heparin 3000 units IV bolus x 1, then  Increase heparin gtt to 2250 units/hr.  Stop on 12/28 at 0100 per Ortho. Check 6 hr heparin level Daily heparin level and CBC   Tonga Prout D. Laney Potashang, PharmD, BCPS Pager:  [phone] 12/16/2017, 10:32 AM

## 2017-12-16 NOTE — Progress Notes (Signed)
[facility] PROGRESS NOTE  HILLERY BHALLA ZOX:[id number] DOB: 03-30-1971 DOA: 12/11/2017  PCP: Georgann Housekeeper, MD  Brief History/Interval Summary: 46 year old male with a past medical history of hypertension, obesity, osteoarthritis previous left hip replacement in 2017 and right knee replacement in January 2018 presented after sustaining a mechanical fall while at work.  This resulted in distal right femur fracture.  Subsequently patient developed fever.  No etiology was found initially.  Lower extremity Doppler studies positive for DVT.  Now also experiencing polyarthritis.  Patient with history of gout.  Reason for Visit: Periprosthetic fracture of the distal right femur  Consultants: Orthopedics  Procedures:   Lower extremity venous Doppler Final Interpretation Right: There is evidence of acute DVT in the Popliteal vein, Posterior Tibial vein, and Peroneal vein. No cystic structure found in the popliteal fossa. Left: There is no evidence of deep vein thrombosis in the lower extremity. No cystic structure found in the popliteal fossa.  Antibiotics: None  Subjective/Interval History: Patient continues to have fever.  He states that his wrist joints and elbow joints are feeling stiff and has pain in these joints.  He reports a history of gout but does not take any definitive medication for this.    ROS: Denies any nausea or vomiting.  Objective:  Vital Signs  Vitals:   12/15/17 0955 12/15/17 1847 12/15/17 1900 12/16/17 0459  BP: (!) 161/80  (!) 146/64 (!) 157/77  Pulse: 86  91 84  Resp: 16  18 20   Temp: 99.7 F (37.6 C) (!) 101.6 F (38.7 C) (!) 102.2 F (39 C) (!) 101.2 F (38.4 C)  TempSrc: Oral Oral Oral Oral  SpO2: 99%  96% 98%  Weight:      Height:        Intake/Output Summary (Last 24 hours) at 12/16/2017 1301 Last data filed at 12/16/2017 0732 Gross per 24 hour  Intake 1322.91 ml  Output 2000 ml  Net -677.09 ml   Filed Weights   12/11/17 1010    Weight: (!) 149.7 kg (330 lb)    General appearance: Awake alert.  In no distress. Resp: Lungs are clear to auscultation bilaterally.  No wheezing rales or rhonchi. Cardio: S1-S2 is normal regular.  No S3 or S4.  No rubs murmurs or bruit GI: Abdomen remains soft.  Nontender nondistended  Extremities/musculoskeletal: Patient noted to have swelling of both his wrist joints and elbow joints.  Range of motion is restricted.  Warm to touch.  Right knee continues to be swollen.  Does not appear to be any more warm to touch compared to the left. No skin rashes Neurologic: No focal neurological deficits.  Lab Results:  Data Reviewed: I have personally reviewed following labs and imaging studies  CBC: Recent Labs  Lab 12/11/17 1038 12/13/17 2324 12/15/17 0735 12/16/17 0816  WBC 6.2 10.3 10.2 11.1*  NEUTROABS 4.0 8.4*  --   --   HGB 13.8 9.9* 8.9* 9.2*  HCT 39.5 29.8* 27.5* 26.0*  MCV 97.1 100.0 100.0 98.9  PLT 274 201 221 283    Basic Metabolic Panel: Recent Labs  Lab 12/11/17 1038 12/13/17 2324 12/14/17 1810 12/15/17 0735 12/16/17 0816  NA 138 136 138 138 136  K 3.9 3.6 3.9 3.9 3.9  CL 104 104 107 108 105  CO2 21* 23 24 23 23   GLUCOSE 120* 258* 136* 131* 130*  BUN 21* 13 13 14 13   CREATININE 1.12 1.32* 1.23 1.12 1.09  CALCIUM 9.0 8.7* 8.6* 8.5* 8.5*  MG 1.8  --   --   --   --     GFR: Estimated Creatinine Clearance: 130.8 mL/min (by C-G formula based on SCr of 1.09 mg/dL).   Recent Results (from the past 240 hour(s))  MRSA PCR Screening     Status: None   Collection Time: 12/13/17  9:51 AM  Result Value Ref Range Status   MRSA by PCR NEGATIVE NEGATIVE Final    Comment:        The GeneXpert MRSA Assay (FDA approved for NASAL specimens only), is one component of a comprehensive MRSA colonization surveillance program. It is not intended to diagnose MRSA infection nor to guide or monitor treatment for MRSA infections.   Culture, blood (routine x 2)      Status: None (Preliminary result)   Collection Time: 12/13/17 10:51 PM  Result Value Ref Range Status   Specimen Description BLOOD RIGHT ANTECUBITAL  Final   Special Requests   Final    BOTTLES DRAWN AEROBIC AND ANAEROBIC Blood Culture adequate volume   Culture NO GROWTH 2 DAYS  Final   Report Status PENDING  Incomplete  Culture, blood (routine x 2)     Status: None (Preliminary result)   Collection Time: 12/13/17 11:04 PM  Result Value Ref Range Status   Specimen Description BLOOD RIGHT HAND  Final   Special Requests   Final    BOTTLES DRAWN AEROBIC ONLY Blood Culture adequate volume   Culture NO GROWTH 2 DAYS  Final   Report Status PENDING  Incomplete  Culture, Urine     Status: None   Collection Time: 12/14/17  8:50 AM  Result Value Ref Range Status   Specimen Description URINE, CATHETERIZED  Final   Special Requests NONE  Final   Culture NO GROWTH  Final   Report Status 12/15/2017 FINAL  Final      Radiology Studies: No results found.   Medications:  Scheduled: . amLODipine  10 mg Oral Daily  . carvedilol  25 mg Oral BID  . hydrALAZINE  25 mg Oral Q8H  . ibuprofen  600 mg Oral TID WC  . lidocaine  1 patch Transdermal Q24H  . senna  2 tablet Oral QHS   Continuous: . sodium chloride 75 mL/hr at 12/16/17 0831  . heparin 2,250 Units/hr (12/16/17 1038)   ZOX:[id number]:acetaminophen, diphenhydrAMINE, HYDROmorphone (DILAUDID) injection, oxyCODONE  Assessment/Plan:  Active Problems:   Other fracture of right femur, initial encounter for closed fracture (HCC)   Stress fracture, right femur, initial encounter for fracture   Fever He continues to be febrile.  Infectious workup has been negative.  However he was found to have right lower extremity DVT which could be responsible for the fever.  He also appears to be experiencing an acute gout flare which could be another reason for fever.  Blood cultures have been negative so far.  Treat with anti-inflammatory drugs for  now.  Right lower extremity DVT Found to have distal in the right lower extremity.  Most likely due to his recent injury and immobilization.  Patient started on IV heparin.  Discussed with vascular surgery yesterday (Dr. Imogene Burnhen).  No role for IVC filter.  Patient heparin may be stopped for surgery and then resumed.  And then he can be transitioned to oral anticoagulation.  Polyarthritis Patient mentions history of gout.  Does not take allopurinol or any other medications for same.  Uric acid level noted to be 6.7.  Motrin ordered for 3 doses. Hesitate using it  long-term due to his history of CHF.  Patient continues to have symptoms.  We will initiate steroids.  Normocytic anemia Patient did have a drop in hemoglobin.  Hemoglobin actually quite similar to what it was back in January.  So his initial value could have been hemoconcentrated.  He could have also had some bleeding in his fracture location.  Otherwise no other overt bleeding has been noted.  Hemoglobin is stable today.  Anemia panel showed ferritin of 680.  B12 486.  Folate 6.3.  Continue to monitor.   Acute comminuted periprosthetic fracture of the distal right femur Plan is for surgical intervention tomorrow morning.  Mechanical fall at home Patient denies any dizziness or lightheadedness prior to his fall.  He was laughing hard and took a step, was not looking where he was going, and tripped resulting in this fall.  No syncopal episode.  Does not need any further workup at this time.  History of essential hypertension/Chronic Systolic CHF Continue home medications.  Monitor blood pressures closely.  Patient also mentions a history of CHF.  Last echocardiogram is from 2014 with an ejection fraction was noted to be 40-45%.  He has seen by Dr. Eldridge DaceVaranasi with cardiology previously. However does not follow up on a regular basis.  This apparently is managed by his PCP.  Takes diuretics on a daily basis.  Cardiac status appears to be stable.   EKG does not show any ischemic changes.  He has had 2 orthopedic procedures within the last one year without any complications.  Seems to be well compensated at this time.  Outpatient monitoring by PCP as before. Resume furosemide postoperatively.  Morbid Obesity Body mass index is 42.37 kg/m.  DVT Prophylaxis: SCDs    Code Status: Full code Family Communication: Discussed with patient Disposition Plan: Management as outlined above.  Hopefully he can undergo surgery tomorrow.    LOS: 5 days   Osvaldo ShipperGokul Hazelgrace Bonham  [facility] Pager [phone] 12/16/2017, 1:01 PM  If 7PM-7AM, please contact night-coverage at [email or web address], password [email or web address]

## 2017-12-16 NOTE — Care Management (Signed)
Received a call from Jonne PlyCindy Felix [phone] Workers Comp case Production designer, theatre/television/filmManager , Workers Comp will cover hospital bill up until the end of today , but nothing going forward . Ms Rulon EisenmengerFelix stated the insurance adjustor Tammy has notified patient.  I went to speak with patient to make sure he was aware. Patient unaware .Patient called risk management at his work . Explained patient needs to give his private insurance company name and number to admitting , so after today hospital will bill private insurance.   Patient voiced understanding. NCM called Ms Rulon EisenmengerFelix back and asked her to call patient directly. She will have Adjustor Tammy call patient.

## 2017-12-17 ENCOUNTER — Inpatient Hospital Stay (HOSPITAL_COMMUNITY): Payer: 59 | Admitting: Anesthesiology

## 2017-12-17 ENCOUNTER — Inpatient Hospital Stay (HOSPITAL_COMMUNITY): Payer: 59

## 2017-12-17 ENCOUNTER — Encounter (HOSPITAL_COMMUNITY)
Admission: EM | Disposition: A | Discharge status: Home Health | Payer: Self-pay | Source: Home / Self Care | Attending: Internal Medicine

## 2017-12-17 ENCOUNTER — Encounter (HOSPITAL_COMMUNITY): Payer: Self-pay | Admitting: Anesthesiology

## 2017-12-17 DIAGNOSIS — S72401A Unspecified fracture of lower end of right femur, initial encounter for closed fracture: Principal | ICD-10-CM

## 2017-12-17 DIAGNOSIS — I824Z1 Acute embolism and thrombosis of unspecified deep veins of right distal lower extremity: Secondary | ICD-10-CM

## 2017-12-17 DIAGNOSIS — I82431 Acute embolism and thrombosis of right popliteal vein: Secondary | ICD-10-CM

## 2017-12-17 DIAGNOSIS — I82441 Acute embolism and thrombosis of right tibial vein: Secondary | ICD-10-CM

## 2017-12-17 HISTORY — PX: FEMUR IM NAIL: SHX1597

## 2017-12-17 LAB — GLUCOSE, CAPILLARY: Glucose-Capillary: 165 mg/dL — ABNORMAL HIGH (ref 65–99)

## 2017-12-17 LAB — CBC
HCT: 26.4 % — ABNORMAL LOW (ref 39.0–52.0)
Hemoglobin: 8.7 g/dL — ABNORMAL LOW (ref 13.0–17.0)
MCH: 32.3 pg (ref 26.0–34.0)
MCHC: 33 g/dL (ref 30.0–36.0)
MCV: 98.1 fL (ref 78.0–100.0)
PLATELETS: 335 10*3/uL (ref 150–400)
RBC: 2.69 MIL/uL — ABNORMAL LOW (ref 4.22–5.81)
RDW: 14.2 % (ref 11.5–15.5)
WBC: 10.4 10*3/uL (ref 4.0–10.5)

## 2017-12-17 LAB — SURGICAL PCR SCREEN
MRSA, PCR: NEGATIVE
Staphylococcus aureus: NEGATIVE

## 2017-12-17 LAB — BASIC METABOLIC PANEL
Anion gap: 9 (ref 5–15)
BUN: 13 mg/dL (ref 6–20)
CALCIUM: 8.7 mg/dL — AB (ref 8.9–10.3)
CO2: 23 mmol/L (ref 22–32)
CREATININE: 0.98 mg/dL (ref 0.61–1.24)
Chloride: 105 mmol/L (ref 101–111)
GFR calc Af Amer: >60 mL/min (ref >60)
Glucose, Bld: 159 mg/dL — ABNORMAL HIGH (ref 65–99)
Potassium: 4.2 mmol/L (ref 3.5–5.1)
SODIUM: 137 mmol/L (ref 135–145)

## 2017-12-17 LAB — PREPARE RBC (CROSSMATCH)

## 2017-12-17 SURGERY — INSERTION, INTRAMEDULLARY ROD, FEMUR, RETROGRADE
Anesthesia: General | Site: Leg Upper | Laterality: Right

## 2017-12-17 MED ORDER — ROCURONIUM BROMIDE 10 MG/ML (PF) SYRINGE
PREFILLED_SYRINGE | INTRAVENOUS | Status: AC
  Filled 2017-12-17: qty 5

## 2017-12-17 MED ORDER — HYDROMORPHONE HCL 1 MG/ML IJ SOLN
INTRAMUSCULAR | Status: AC
  Administered 2017-12-17: 1 mg
  Filled 2017-12-17: qty 1

## 2017-12-17 MED ORDER — METOCLOPRAMIDE HCL 5 MG PO TABS: 5.0000 mg | ORAL_TABLET | Freq: Three times a day (TID) | ORAL | Status: DC | PRN

## 2017-12-17 MED ORDER — MIDAZOLAM HCL 5 MG/5ML IJ SOLN
INTRAMUSCULAR | Status: DC | PRN
  Administered 2017-12-17: 2 mg via INTRAVENOUS

## 2017-12-17 MED ORDER — LACTATED RINGERS IV SOLN
INTRAVENOUS | Status: DC | PRN
  Administered 2017-12-17: 07:00:00 via INTRAVENOUS

## 2017-12-17 MED ORDER — PROMETHAZINE HCL 25 MG/ML IJ SOLN: 6.2500 mg | INTRAMUSCULAR | Status: DC | PRN

## 2017-12-17 MED ORDER — CEFAZOLIN SODIUM-DEXTROSE 2-4 GM/100ML-% IV SOLN
INTRAVENOUS | Status: AC
  Filled 2017-12-17: qty 100

## 2017-12-17 MED ORDER — ACETAMINOPHEN 325 MG PO TABS
650.0000 mg | ORAL_TABLET | Freq: Four times a day (QID) | ORAL | Status: DC | PRN
  Administered 2017-12-17 – 2017-12-19 (×2): 650 mg via ORAL
  Filled 2017-12-17 (×2): qty 2

## 2017-12-17 MED ORDER — PROPOFOL 10 MG/ML IV BOLUS
INTRAVENOUS | Status: DC | PRN
  Administered 2017-12-17: 200 mg via INTRAVENOUS

## 2017-12-17 MED ORDER — FENTANYL CITRATE (PF) 250 MCG/5ML IJ SOLN
INTRAMUSCULAR | Status: AC
  Filled 2017-12-17: qty 5

## 2017-12-17 MED ORDER — ROCURONIUM BROMIDE 100 MG/10ML IV SOLN
INTRAVENOUS | Status: DC | PRN
  Administered 2017-12-17: 20 mg via INTRAVENOUS
  Administered 2017-12-17: 60 mg via INTRAVENOUS
  Administered 2017-12-17: 20 mg via INTRAVENOUS

## 2017-12-17 MED ORDER — ACETAMINOPHEN 650 MG RE SUPP: 650.0000 mg | Freq: Four times a day (QID) | RECTAL | Status: DC | PRN

## 2017-12-17 MED ORDER — LIDOCAINE 2% (20 MG/ML) 5 ML SYRINGE
INTRAMUSCULAR | Status: AC
  Filled 2017-12-17: qty 5

## 2017-12-17 MED ORDER — FENTANYL CITRATE (PF) 100 MCG/2ML IJ SOLN
INTRAMUSCULAR | Status: DC | PRN
  Administered 2017-12-17: 100 ug via INTRAVENOUS
  Administered 2017-12-17 (×3): 50 ug via INTRAVENOUS

## 2017-12-17 MED ORDER — DEXAMETHASONE SODIUM PHOSPHATE 10 MG/ML IJ SOLN
INTRAMUSCULAR | Status: AC
  Filled 2017-12-17: qty 1

## 2017-12-17 MED ORDER — HYDROMORPHONE HCL 1 MG/ML IJ SOLN
0.2500 mg | INTRAMUSCULAR | Status: DC | PRN
  Administered 2017-12-17 (×4): 0.5 mg via INTRAVENOUS

## 2017-12-17 MED ORDER — ONDANSETRON HCL 4 MG/2ML IJ SOLN
INTRAMUSCULAR | Status: DC | PRN
  Administered 2017-12-17: 4 mg via INTRAVENOUS

## 2017-12-17 MED ORDER — ONDANSETRON HCL 4 MG/2ML IJ SOLN: 4.0000 mg | Freq: Four times a day (QID) | INTRAMUSCULAR | Status: DC | PRN

## 2017-12-17 MED ORDER — ONDANSETRON HCL 4 MG PO TABS: 4.0000 mg | ORAL_TABLET | Freq: Four times a day (QID) | ORAL | Status: DC | PRN

## 2017-12-17 MED ORDER — HYDROCODONE-ACETAMINOPHEN 5-325 MG PO TABS
ORAL_TABLET | ORAL | Status: AC
  Filled 2017-12-17: qty 2

## 2017-12-17 MED ORDER — CEFAZOLIN SODIUM-DEXTROSE 1-4 GM/50ML-% IV SOLN
INTRAVENOUS | Status: AC
  Filled 2017-12-17: qty 50

## 2017-12-17 MED ORDER — METOCLOPRAMIDE HCL 5 MG/ML IJ SOLN: 5.0000 mg | Freq: Three times a day (TID) | INTRAMUSCULAR | Status: DC | PRN

## 2017-12-17 MED ORDER — LACTATED RINGERS IV SOLN
INTRAVENOUS | Status: DC | PRN
  Administered 2017-12-17 (×2): via INTRAVENOUS

## 2017-12-17 MED ORDER — POLYETHYLENE GLYCOL 3350 17 G PO PACK
17.0000 g | PACK | Freq: Every day | ORAL | Status: DC
  Administered 2017-12-17 – 2017-12-20 (×4): 17 g via ORAL
  Filled 2017-12-17 (×4): qty 1

## 2017-12-17 MED ORDER — ONDANSETRON HCL 4 MG/2ML IJ SOLN
INTRAMUSCULAR | Status: AC
  Filled 2017-12-17: qty 2

## 2017-12-17 MED ORDER — EPHEDRINE SULFATE 50 MG/ML IJ SOLN
INTRAMUSCULAR | Status: AC
  Filled 2017-12-17: qty 1

## 2017-12-17 MED ORDER — HYDROMORPHONE HCL 1 MG/ML IJ SOLN
INTRAMUSCULAR | Status: AC
  Filled 2017-12-17: qty 1

## 2017-12-17 MED ORDER — CEFAZOLIN SODIUM-DEXTROSE 2-4 GM/100ML-% IV SOLN
2.0000 g | Freq: Four times a day (QID) | INTRAVENOUS | Status: AC
  Administered 2017-12-17 (×2): 2 g via INTRAVENOUS
  Filled 2017-12-17 (×2): qty 100

## 2017-12-17 MED ORDER — DEXAMETHASONE SODIUM PHOSPHATE 10 MG/ML IJ SOLN
INTRAMUSCULAR | Status: DC | PRN
  Administered 2017-12-17: 10 mg via INTRAVENOUS

## 2017-12-17 MED ORDER — HYDROCODONE-ACETAMINOPHEN 5-325 MG PO TABS
1.0000 | ORAL_TABLET | Freq: Four times a day (QID) | ORAL | Status: DC | PRN
  Administered 2017-12-17 – 2017-12-20 (×11): 2 via ORAL
  Filled 2017-12-17 (×11): qty 2

## 2017-12-17 MED ORDER — 0.9 % SODIUM CHLORIDE (POUR BTL) OPTIME
TOPICAL | Status: DC | PRN
  Administered 2017-12-17: 1000 mL

## 2017-12-17 MED ORDER — SUGAMMADEX SODIUM 500 MG/5ML IV SOLN
INTRAVENOUS | Status: AC
  Filled 2017-12-17: qty 5

## 2017-12-17 MED ORDER — MORPHINE SULFATE (PF) 2 MG/ML IV SOLN: 0.5000 mg | INTRAVENOUS | Status: DC | PRN

## 2017-12-17 MED ORDER — MIDAZOLAM HCL 2 MG/2ML IJ SOLN
INTRAMUSCULAR | Status: AC
  Filled 2017-12-17: qty 2

## 2017-12-17 MED ORDER — CEFAZOLIN SODIUM 10 G IJ SOLR
INTRAMUSCULAR | Status: DC | PRN
  Administered 2017-12-17: 3 g via INTRAVENOUS

## 2017-12-17 MED ORDER — ENOXAPARIN SODIUM 150 MG/ML ~~LOC~~ SOLN
1.0000 mg/kg | Freq: Two times a day (BID) | SUBCUTANEOUS | Status: DC
  Administered 2017-12-18 – 2017-12-19 (×4): 150 mg via SUBCUTANEOUS
  Filled 2017-12-17 (×5): qty 1

## 2017-12-17 MED ORDER — PHENOL 1.4 % MT LIQD: 1.0000 | OROMUCOSAL | Status: DC | PRN

## 2017-12-17 MED ORDER — MENTHOL 3 MG MT LOZG: 1.0000 | LOZENGE | OROMUCOSAL | Status: DC | PRN

## 2017-12-17 MED ORDER — PROPOFOL 10 MG/ML IV BOLUS
INTRAVENOUS | Status: AC
  Filled 2017-12-17: qty 40

## 2017-12-17 MED ORDER — LIDOCAINE HCL (CARDIAC) 20 MG/ML IV SOLN
INTRAVENOUS | Status: DC | PRN
  Administered 2017-12-17: 100 mg via INTRAVENOUS

## 2017-12-17 MED ORDER — SUGAMMADEX SODIUM 500 MG/5ML IV SOLN
INTRAVENOUS | Status: DC | PRN
  Administered 2017-12-17: 300 mg via INTRAVENOUS

## 2017-12-17 MED ORDER — SODIUM CHLORIDE 0.9 % IV SOLN: Freq: Once | INTRAVENOUS | Status: DC

## 2017-12-17 SURGICAL SUPPLY — 74 items
ALCOHOL ISOPROPYL (RUBBING) (MISCELLANEOUS) ×2 IMPLANT
BANDAGE ESMARK 6X9 LF (GAUZE/BANDAGES/DRESSINGS) ×1 IMPLANT
BIT DRILL CALIBRATED 4.3MMX365 (DRILL) ×1 IMPLANT
BIT DRILL CROWE PNT TWST 4.5MM (DRILL) ×1 IMPLANT
BNDG COHESIVE 4X5 TAN STRL (GAUZE/BANDAGES/DRESSINGS) ×2 IMPLANT
BNDG COHESIVE 6X5 TAN STRL LF (GAUZE/BANDAGES/DRESSINGS) ×2 IMPLANT
BNDG ELASTIC 6X15 VLCR STRL LF (GAUZE/BANDAGES/DRESSINGS) ×2 IMPLANT
BNDG ESMARK 6X9 LF (GAUZE/BANDAGES/DRESSINGS) ×2
CANISTER WOUNDNEG PRESSURE 500 (CANNISTER) ×2 IMPLANT
CHLORAPREP W/TINT 26ML (MISCELLANEOUS) ×4 IMPLANT
COVER PERINEAL POST (MISCELLANEOUS) IMPLANT
COVER SURGICAL LIGHT HANDLE (MISCELLANEOUS) ×2 IMPLANT
DERMABOND ADHESIVE PROPEN (GAUZE/BANDAGES/DRESSINGS) ×1
DERMABOND ADVANCED (GAUZE/BANDAGES/DRESSINGS) ×2
DERMABOND ADVANCED .7 DNX12 (GAUZE/BANDAGES/DRESSINGS) ×2 IMPLANT
DERMABOND ADVANCED .7 DNX6 (GAUZE/BANDAGES/DRESSINGS) ×1 IMPLANT
DRAPE C-ARM 42X72 X-RAY (DRAPES) ×2 IMPLANT
DRAPE C-ARMOR (DRAPES) ×2 IMPLANT
DRAPE IMP U-DRAPE 54X76 (DRAPES) ×4 IMPLANT
DRAPE ORTHO SPLIT 77X108 STRL (DRAPES) ×2
DRAPE STERI IOBAN 125X83 (DRAPES) ×2 IMPLANT
DRAPE SURG ORHT 6 SPLT 77X108 (DRAPES) ×2 IMPLANT
DRAPE U-SHAPE 47X51 STRL (DRAPES) ×4 IMPLANT
DRAPE UNIVERSAL PACK (DRAPES) ×2 IMPLANT
DRESSING PREVENA PLUS CUSTOM (GAUZE/BANDAGES/DRESSINGS) ×1 IMPLANT
DRILL CALIBRATED 4.3MMX365 (DRILL) ×2
DRILL CROWE POINT TWIST 4.5MM (DRILL) ×2
DRSG AQUACEL AG ADV 3.5X14 (GAUZE/BANDAGES/DRESSINGS) ×2 IMPLANT
DRSG MEPILEX BORDER 4X4 (GAUZE/BANDAGES/DRESSINGS) ×2 IMPLANT
DRSG PREVENA PLUS CUSTOM (GAUZE/BANDAGES/DRESSINGS) ×2
ELECT REM PT RETURN 9FT ADLT (ELECTROSURGICAL) ×2
ELECTRODE REM PT RTRN 9FT ADLT (ELECTROSURGICAL) ×1 IMPLANT
FACESHIELD WRAPAROUND (MASK) ×4 IMPLANT
GLOVE BIO SURGEON STRL SZ8.5 (GLOVE) ×4 IMPLANT
GLOVE BIOGEL PI IND STRL 7.5 (GLOVE) ×1 IMPLANT
GLOVE BIOGEL PI IND STRL 8.5 (GLOVE) ×2 IMPLANT
GLOVE BIOGEL PI INDICATOR 7.5 (GLOVE) ×1
GLOVE BIOGEL PI INDICATOR 8.5 (GLOVE) ×2
GLOVE SURG SS PI 6.5 STRL IVOR (GLOVE) ×4 IMPLANT
GOWN STRL REUS W/ TWL LRG LVL3 (GOWN DISPOSABLE) ×1 IMPLANT
GOWN STRL REUS W/TWL 2XL LVL3 (GOWN DISPOSABLE) ×2 IMPLANT
GOWN STRL REUS W/TWL LRG LVL3 (GOWN DISPOSABLE) ×1
GUIDEPIN 3.2X17.5 THRD DISP (PIN) ×2 IMPLANT
GUIDEWIRE BEAD TIP (WIRE) ×4 IMPLANT
HANDPIECE INTERPULSE COAX TIP (DISPOSABLE)
KIT ROOM TURNOVER OR (KITS) ×2 IMPLANT
MANIFOLD NEPTUNE II (INSTRUMENTS) ×2 IMPLANT
MARKER SKIN DUAL TIP RULER LAB (MISCELLANEOUS) ×2 IMPLANT
NAIL FEM RETRO 13.5X400 (Nail) ×2 IMPLANT
NS IRRIG 1000ML POUR BTL (IV SOLUTION) ×2 IMPLANT
PACK GENERAL/GYN (CUSTOM PROCEDURE TRAY) ×2 IMPLANT
PAD ARMBOARD 7.5X6 YLW CONV (MISCELLANEOUS) ×4 IMPLANT
REAMER ONE STEP 12.2MM (TRAUMA) ×2 IMPLANT
SCREW CORT TI DBL LEAD 5X36 (Screw) ×2 IMPLANT
SCREW CORT TI DBL LEAD 5X85 (Screw) ×2 IMPLANT
SCREW CORT TI DBL LEAD 5X90 (Screw) ×2 IMPLANT
SCREW CORT TI DBL LEAD 5X95 (Screw) ×2 IMPLANT
SCREW CORT TI-DBLE LEAD 5X100 (Screw) ×2 IMPLANT
SET HNDPC FAN SPRY TIP SCT (DISPOSABLE) IMPLANT
STOCKINETTE IMPERVIOUS LG (DRAPES) ×2 IMPLANT
SUT MNCRL AB 3-0 PS2 18 (SUTURE) ×2 IMPLANT
SUT MNCRL AB 3-0 PS2 27 (SUTURE) IMPLANT
SUT MON AB 2-0 CT1 27 (SUTURE) IMPLANT
SUT MON AB 2-0 CT1 36 (SUTURE) ×2 IMPLANT
SUT VIC AB 0 CT1 27 (SUTURE) ×3
SUT VIC AB 0 CT1 27XBRD ANBCTR (SUTURE) ×3 IMPLANT
SUT VIC AB 1 CT1 27 (SUTURE) ×2
SUT VIC AB 1 CT1 27XBRD ANBCTR (SUTURE) ×2 IMPLANT
SUT VIC AB 1 CTX 27 (SUTURE) ×2 IMPLANT
SUT VIC AB 2-0 CT1 27 (SUTURE) ×1
SUT VIC AB 2-0 CT1 TAPERPNT 27 (SUTURE) ×1 IMPLANT
SUT VLOC 180 0 24IN GS25 (SUTURE) ×4 IMPLANT
TOWEL OR 17X24 6PK STRL BLUE (TOWEL DISPOSABLE) IMPLANT
TOWEL OR 17X26 10 PK STRL BLUE (TOWEL DISPOSABLE) ×2 IMPLANT

## 2017-12-17 NOTE — Progress Notes (Signed)
Orthopedic Tech Progress Note Patient Details:  Andre Hoffman [birth year] [id number]      Post Interventions Patient Tolerated: Well Instructions Provided: Care of device Trapeze bar patient helper  Nikki DomCrawford, Patriciaann Rabanal 12/17/2017, 1:02 PM

## 2017-12-17 NOTE — Interval H&P Note (Signed)
History and Physical Interval Note:  12/17/2017 7:10 AM  Andre Hoffman  has presented today for surgery, with the diagnosis of femur fracture  The various methods of treatment have been discussed with the patient and family. After consideration of risks, benefits and other options for treatment, the patient has consented to  Procedure(s): INTRAMEDULLARY (IM) RETROGRADE FEMORAL NAILING (Right) as a surgical intervention .  The patient's history has been reviewed, patient examined, no change in status, stable for surgery.  I have reviewed the patient's chart and labs.  Questions were answered to the patient's satisfaction.    Found to have RLE DVT in the interim. Received 24 hrs heparin gtt, now hold for 6 hrs. Proceed with surgery. Increased risk for bleeding complications due to anticoagulants.   Iline OvenBrian J Rhonin Trott

## 2017-12-17 NOTE — Progress Notes (Signed)
ANTICOAGULATION CONSULT NOTE - Initial Consult  Pharmacy Consult for enoxaparin Indication: DVT  Allergies  Allergen Reactions  . Lisinopril Swelling    Angioedema  . Penicillins Swelling    SWELLING REACTION UNSPECIFIED as a child  Has patient had a PCN reaction causing immediate rash, facial/tongue/throat swelling, SOB or lightheadedness with hypotension: Unknown Has patient had a PCN reaction causing severe rash involving mucus membranes or skin necrosis: Unknown Has patient had a PCN reaction that required hospitalization: Unknown Has patient had a PCN reaction occurring within the last 10 years: No If all of the above answers are "NO", then may proceed with Cephalosporin use.      Patient Measurements: Height: 6\' 2"  (188 cm) Weight: (!) 330 lb (149.7 kg) IBW/kg (Calculated) : 82.2  Vital Signs: Temp: 99 F (37.2 C) (12/28 1526) Temp Source: Oral (12/28 1526) BP: 162/74 (12/28 1526) Pulse Rate: 84 (12/28 1526)  Labs: Recent Labs    12/15/17 0735 12/15/17 2059 12/16/17 0816 12/16/17 1550 12/17/17 0618  HGB 8.9*  --  9.2*  --  8.7*  HCT 27.5*  --  26.0*  --  26.4*  PLT 221  --  283  --  335  HEPARINUNFRC  --  <0.10* <0.10* <0.10*  --   CREATININE 1.12  --  1.09  --  0.98    Estimated Creatinine Clearance: 145.5 mL/min (by C-G formula based on SCr of 0.98 mg/dL).   Medical History: Past Medical History:  Diagnosis Date  . Arthritis    OSTEO  . CHF (congestive heart failure) (HCC)    Echo- 2014- EF 40-45%; mild LVH.Takes Lasix daily  . History of bronchitis    3 yrs ago  . History of gout    only once   . Hypertension    takes Coreg and Losartan daily  . Joint pain   . White coat hypertension     Medications:  Scheduled:  . amLODipine  10 mg Oral Daily  . carvedilol  25 mg Oral BID  . [START ON 12/18/2017] enoxaparin (LOVENOX) injection  1 mg/kg Subcutaneous Q12H  . hydrALAZINE  25 mg Oral Q8H  . HYDROcodone-acetaminophen      . HYDROmorphone       . lidocaine  1 patch Transdermal Q24H  . polyethylene glycol  17 g Oral Daily  . predniSONE  20 mg Oral BID  . senna  2 tablet Oral QHS    Assessment: 46 yom who underwent open reduction internal fixation and subsequently developed DVT in R leg (acute DVT in the Popliteal vein, Posterior Tibial vein, and Peroneal vein). Underwent retrograde intramedullary fixation of R femur and application of incisional wound VAC (AET 12/28 1052). Plan to start enoxaparin 1 mg/kg every 12 hours starting tomorrow 24 hrs after surgery and transition to Xarelto at discharge.   Renal function is stable (Scr 0.98; normCrCl ~95 mL/min). Hgb is 8.7 with platelets within normal limits - did received 1PRBC today prior procedure. No notes referencing bleeding.   Goal of Therapy:  Monitor platelets by anticoagulation protocol: Yes   Plan:  Will start enoxaparin 150 mg (1 mg/kg) every 12 hours starting 12/29 at 1100 Monitor renal function, CBC, and s/sx of bleeding Follow up plan to transition to Xarelto at time of discharge  Girard CooterKimberly Perkins, PharmD Clinical Pharmacist  Pager: [phone] Phone: [phone] 12/17/2017,7:11 PM

## 2017-12-17 NOTE — Op Note (Signed)
OPERATIVE REPORT   12/17/2017  10:16 AM  PATIENT:  Andre Hoffman   SURGEON:  Jonette PesaBrian J Myangel Summons, MD  ASSISTANT: April Green, RNFA.   PREOPERATIVE DIAGNOSIS: Periprosthetic right distal femur fracture.  POSTOPERATIVE DIAGNOSIS:  Same.  PROCEDURE: 1.  Retrograde intramedullary fixation of right femur. 2.  Application of incisional wound VAC.  ANESTHESIA:   GETA.  ANTIBIOTICS: 3 g Ancef.  IMPLANTS:  Biomet retrograde [place] nail, size 13.5 x 400 mm. 5 mm distal interlocking screw x4, proximal interlocking screw x1.  SPECIMENS: None.  COMPLICATIONS: None.  DISPOSITION: Stable to PACU.  SURGICAL INDICATIONS:  Andre Hoffman is a 46 y.o. male with a diagnosis of periprosthetic right distal femur fracture.  Surgery was attempted 2 nights ago, but after induction of anesthesia, the patient became febrile.  Workup revealed right lower extremity DVT.  He was started on a heparin drip for greater than 24 hours.  Heparin drip was held 6 hours before surgery.  The risks, benefits, and alternatives were discussed with the patient preoperatively including but not limited to the risks of infection, bleeding, venous thromboembolism, nerve / blood vessel injury, malunion, nonunion, stiffness, cardiopulmonary complications, the need for repeat surgery, among others, and the patient was willing to proceed.  PROCEDURE IN DETAIL: Identified the patient holding area using 2 identifiers.  The surgical site was marked by myself.  Patient was taken to the operating room, placed supine on the operating table.  General anesthesia was induced.  All bony prominences were well-padded.  A bump was placed under the right hip.  The right lower extremity was prepped and draped in the normal sterile surgical fashion.  A timeout was called, verifying site and site of surgery.  I used a #10 blade to sharply excise his previous anterior knee incision.  Full-thickness skin flaps were created.  Medial parapatellar  arthrotomy was made.  The supracondylar fracture was identified.  Fracture was located at the tip of the flange and proceeded proximally.  There was medial and lateral comminution.  There was no evidence of infection in the knee.  Components were well fixed.  I reestablish the medial and lateral gutters.  I sharply excised infrapatellar scar.  The knee was bent up and placed on a triangle.  The fracture was reduced with traction and rotation.  I placed a guidepin for retrograde femoral nail.  The pin was placed under live AP and lateral fluoroscopic control.  I used the entry reamer.  I placed the guidewire to the level of the lesser trochanter approximately.  I measured the length of the nail.  I reamed up to a size 15 mm reamer with chatter.  Therefore I selected a 13.5 mm nail.  The nail was assembled on the jig on the back table.  The nail was impacted into place.  Reduction and nail placement were confirmed on AP and lateral fluoroscopy views.  He did have some shortening due to the comminution of the fracture.  I proceeded with placing 2 transverse interlocking screws from lateral to medial.  I was able to place these within the previous skin incision.  I then placed 2 oblique interlocking screws.  Again, the screws were able to be placed within the incision.  The knee was brought to full extension.  Using perfect circle technique, I placed a single proximal interlocking screw.  Final AP and lateral fluoroscopy views were used to confirm fracture reduction and hardware placement.  The wounds were copiously irrigated with saline using  pulse lavage.  The arthrotomy was closed with #1 Vicryl and #0 V loc.  Deep fatty tissue was closed with 2-0 Vicryl suture.  Deep dermal layer was closed with 2-0 Vicryl suture.  Deep dermal layer was closed with 2-0 Monocryl suture.  Skin was reapproximated with 3-0 Monocryl running subcuticular stitch.  Praveena incisional wound VAC was applied according to manufacturer  instructions.  POSTOPERATIVE PLAN: Postoperatively, the patient be readmitted to the hospitalist service.  He will be touchdown weightbearing right lower extremity with a walker.  He can have unrestricted range of motion of the knee.  I have discussed the patient's anticoagulation with the hospitalist, who plans to obtain hematology/oncology consult due to the patient's persistent low heparin levels.  Anticoagulation can be resumed 24 hours postoperatively without bolus.  He will work with physical therapy.  He will undergo disposition planning.  Wound VAC will be converted to a portable Prevena unit upon discharge.  He will need to return to the office 7 days after discharge for wound VAC removal.

## 2017-12-17 NOTE — Consult Note (Signed)
[facility] CONSULT NOTE  Patient Care Team: Georgann HousekeeperHusain, Karrar, MD as PCP - General (Internal Medicine)  CHIEF COMPLAINTS/PURPOSE OF CONSULTATION:  DVT after recent fracture  HISTORY OF PRESENTING ILLNESS:  Andre Hoffman 46 y.o. male is here because of recent diagnosis of DVT involving the popliteal vein, posterior tibial vein and peroneal veins of his right leg.  He has a prior history of left hip replacement and right knee replacement surgeries.  He fell at work and fractured his right femur.  He underwent open reduction internal fixation and subsequently developed DVT involving the right leg.  He was initially put on heparin drip but it was felt that it was not therapeutic on heparin.  We are consulted to discuss what other options he may have.  I reviewed her records extensively and collaborated the history with the patient.   MEDICAL HISTORY:  Past Medical History:  Diagnosis Date  . Arthritis    OSTEO  . CHF (congestive heart failure) (HCC)    Echo- 2014- EF 40-45%; mild LVH.Takes Lasix daily  . History of bronchitis    3 yrs ago  . History of gout    only once   . Hypertension    takes Coreg and Losartan daily  . Joint pain   . White coat hypertension     SURGICAL HISTORY: Past Surgical History:  Procedure Laterality Date  . KNEE ARTHROPLASTY Right 01/18/2017   Procedure: RIGHT TOTAL KNEE ARTHROPLASTY WITH COMPUTER NAVIGATION;  Surgeon: Samson FredericBrian Swinteck, MD;  Location: [facility] OR;  Service: Orthopedics;  Laterality: Right;  . TOTAL HIP ARTHROPLASTY Left 08/31/2016   Procedure: LEFT TOTAL HIP ARTHROPLASTY ANTERIOR APPROACH;  Surgeon: Samson FredericBrian Swinteck, MD;  Location: [facility] OR;  Service: Orthopedics;  Laterality: Left;  Requesting RNFA  . TOTAL KNEE ARTHROPLASTY Right 01/18/2017  . TRICEPS TENDON REPAIR Bilateral     SOCIAL HISTORY: Social History   Socioeconomic History  . Marital status: Married    Spouse name: Not on file  . Number of children: Not on file  .  Years of education: Not on file  . Highest education level: Not on file  Social Needs  . Financial resource strain: Not on file  . Food insecurity - worry: Not on file  . Food insecurity - inability: Not on file  . Transportation needs - medical: Not on file  . Transportation needs - non-medical: Not on file  Occupational History  . Not on file  Tobacco Use  . Smoking status: Never Smoker  . Smokeless tobacco: Never Used  Substance and Sexual Activity  . Alcohol use: No  . Drug use: No  . Sexual activity: Not on file  Other Topics Concern  . Not on file  Social History Narrative  . Not on file    FAMILY HISTORY: Family History  Problem Relation Age of Onset  . Other Father        MVA  . Hypertension Paternal Grandfather   . Other Paternal Grandfather        cardiac arrest  . Diabetes Paternal Grandfather   . Heart attack Paternal Grandfather     ALLERGIES:  is allergic to lisinopril and penicillins.  MEDICATIONS:  Current Facility-Administered Medications  Medication Dose Route Frequency Provider Last Rate Last Dose  . 0.9 %  sodium chloride infusion   Intravenous Continuous Osvaldo ShipperKrishnan, Gokul, MD 75 mL/hr at 12/16/17 2144    . acetaminophen (TYLENOL) tablet 650 mg  650 mg Oral Q6H PRN Samson FredericSwinteck, Brian, MD  Or  . acetaminophen (TYLENOL) suppository 650 mg  650 mg Rectal Q6H PRN Swinteck, Arlys John, MD      . amLODipine (NORVASC) tablet 10 mg  10 mg Oral Daily Dow Adolph N, DO   10 mg at 12/17/17 1326  . carvedilol (COREG) tablet 25 mg  25 mg Oral BID Dow Adolph N, DO   25 mg at 12/16/17 2220  . ceFAZolin (ANCEF) 1-4 GM/50ML-% IVPB           . ceFAZolin (ANCEF) 2-4 GM/100ML-% IVPB           . ceFAZolin (ANCEF) IVPB 2g/100 mL premix  2 g Intravenous Q6H Swinteck, Arlys John, MD 200 mL/hr at 12/17/17 1514 2 g at 12/17/17 1514  . diphenhydrAMINE (BENADRYL) capsule 25 mg  25 mg Oral Q6H PRN Kathlen Mody, MD   25 mg at 12/13/17 0037  . hydrALAZINE (APRESOLINE) tablet 25 mg   25 mg Oral Q8H Osvaldo Shipper, MD   25 mg at 12/17/17 1326  . HYDROcodone-acetaminophen (NORCO/VICODIN) 5-325 MG per tablet 1-2 tablet  1-2 tablet Oral Q6H PRN Samson Frederic, MD   2 tablet at 12/17/17 1117  . HYDROcodone-acetaminophen (NORCO/VICODIN) 5-325 MG per tablet           . HYDROmorphone (DILAUDID) 1 MG/ML injection           . HYDROmorphone (DILAUDID) injection 1 mg  1 mg Intravenous Q2H PRN Dow Adolph N, DO   1 mg at 12/17/17 1540  . lidocaine (LIDODERM) 5 % 1 patch  1 patch Transdermal Q24H Dow Adolph N, DO   1 patch at 12/16/17 1656  . menthol-cetylpyridinium (CEPACOL) lozenge 3 mg  1 lozenge Oral PRN Swinteck, Arlys John, MD       Or  . phenol (CHLORASEPTIC) mouth spray 1 spray  1 spray Mouth/Throat PRN Swinteck, Arlys John, MD      . metoCLOPramide (REGLAN) tablet 5-10 mg  5-10 mg Oral Q8H PRN Swinteck, Arlys John, MD       Or  . metoCLOPramide (REGLAN) injection 5-10 mg  5-10 mg Intravenous Q8H PRN Swinteck, Arlys John, MD      . morphine 2 MG/ML injection 0.5 mg  0.5 mg Intravenous Q2H PRN Swinteck, Arlys John, MD      . ondansetron (ZOFRAN) tablet 4 mg  4 mg Oral Q6H PRN Swinteck, Arlys John, MD       Or  . ondansetron (ZOFRAN) injection 4 mg  4 mg Intravenous Q6H PRN Swinteck, Arlys John, MD      . oxyCODONE (Oxy IR/ROXICODONE) immediate release tablet 5 mg  5 mg Oral Q4H PRN Osvaldo Shipper, MD   5 mg at 12/16/17 1149  . predniSONE (DELTASONE) tablet 20 mg  20 mg Oral BID Osvaldo Shipper, MD   20 mg at 12/17/17 1326  . senna (SENOKOT) tablet 17.2 mg  2 tablet Oral QHS Dow Adolph N, DO   17.2 mg at 12/16/17 2220    REVIEW OF SYSTEMS:   Constitutional: Denies fevers, chills or abnormal night sweats Eyes: Denies blurriness of vision, double vision or watery eyes Ears, nose, mouth, throat, and face: Denies mucositis or sore throat Respiratory: Denies cough, dyspnea or wheezes Cardiovascular: Denies palpitation, chest discomfort or lower extremity swelling Gastrointestinal:  Denies nausea, heartburn  or change in bowel habits Skin: Denies abnormal skin rashes Lymphatics: Denies new lymphadenopathy or easy bruising Neurological:Denies numbness, tingling or new weaknesses Behavioral/Psych: Mood is stable, no new changes   All other systems were reviewed with the patient and are negative.  PHYSICAL EXAMINATION: ECOG PERFORMANCE STATUS: 1 - Symptomatic but completely ambulatory  Vitals:   12/17/17 1412 12/17/17 1526  BP: 139/72 (!) 162/74  Pulse: 67 84  Resp: 20   Temp: 99.3 F (37.4 C) 99 F (37.2 C)  SpO2: 98% 97%   Filed Weights   12/11/17 1010  Weight: (!) 330 lb (149.7 kg)    GENERAL:alert, no distress and comfortable SKIN: skin color, texture, turgor are normal, no rashes or significant lesions EYES: normal, conjunctiva are pink and non-injected, sclera clear OROPHARYNX:no exudate, no erythema and lips, buccal mucosa, and tongue normal  NECK: supple, thyroid normal size, non-tender, without nodularity LYMPH:  no palpable lymphadenopathy in the cervical, axillary or inguinal LUNGS: clear to auscultation and percussion with normal breathing effort HEART: regular rate & rhythm and no murmurs mild right leg swelling,  ABDOMEN:abdomen soft, non-tender and normal bowel sounds Musculoskeletal:no cyanosis of digits and no clubbing  PSYCH: alert & oriented x 3 with fluent speech NEURO: no focal motor/sensory deficits  LABORATORY DATA:  I have reviewed the data as listed Lab Results  Component Value Date   WBC 10.4 12/17/2017   HGB 8.7 (L) 12/17/2017   HCT 26.4 (L) 12/17/2017   MCV 98.1 12/17/2017   PLT 335 12/17/2017   Lab Results  Component Value Date   NA 137 12/17/2017   K 4.2 12/17/2017   CL 105 12/17/2017   CO2 23 12/17/2017    RADIOGRAPHIC STUDIES: I have personally reviewed the radiological reports and agreed with the findings in the report.  ASSESSMENT AND PLAN:  Right leg DVT: Related to recent fracture. I recommended that we start Lovenox 1 mg/kg  subcu twice a day starting tomorrow. At the time of his discharge he should be transitioned to Xarelto. I anticipate that he will need 6 months of anticoagulation. I will see him in my office in 3 months for follow-up. Thank you very much for consulting us.   All questions were answered. The patient knows to call the clinic with any problems, questions or concerns.    Tamsen MeekViinay K Adreana Coull, MD @T @

## 2017-12-17 NOTE — Transfer of Care (Signed)
Immediate Anesthesia Transfer of Care Note  Patient: Andre Hoffman  Procedure(s) Performed: INTRAMEDULLARY (IM) RETROGRADE FEMORAL NAILING (Right Leg Upper)  Patient Location: PACU  Anesthesia Type:General  Level of Consciousness: awake, alert , oriented and sedated  Airway & Oxygen Therapy: Patient Spontanous Breathing and Patient connected to nasal cannula oxygen  Post-op Assessment: Report given to RN, Post -op Vital signs reviewed and stable and Patient moving all extremities  Post vital signs: Reviewed and stable  Last Vitals:  Vitals:   12/17/17 1045 12/17/17 1048  BP: 96/73   Pulse: 83 92  Resp: (!) 21 (!) 23  Temp: 36.6 C 36.6 C  SpO2:  99%    Last Pain:  Vitals:   12/17/17 1048  TempSrc:   PainSc: 0-No pain      Patients Stated Pain Goal: 0 (12/17/17 0504)  Complications: No apparent anesthesia complications

## 2017-12-17 NOTE — Anesthesia Postprocedure Evaluation (Signed)
Anesthesia Post Note  Patient: Mendel RyderJames II Ashmead  Procedure(s) Performed: INTRAMEDULLARY (IM) RETROGRADE FEMORAL NAILING (Right Leg Upper)     Patient location during evaluation: PACU Anesthesia Type: General Level of consciousness: awake and alert Pain management: pain level controlled Vital Signs Assessment: post-procedure vital signs reviewed and stable Respiratory status: spontaneous breathing, nonlabored ventilation, respiratory function stable and patient connected to nasal cannula oxygen Cardiovascular status: blood pressure returned to baseline and stable Postop Assessment: no apparent nausea or vomiting Anesthetic complications: no    Last Vitals:  Vitals:   12/17/17 1412 12/17/17 1526  BP: 139/72 (!) 162/74  Pulse: 67 84  Resp: 20   Temp: 37.4 C 37.2 C  SpO2: 98% 97%    Last Pain:  Vitals:   12/17/17 1610  TempSrc:   PainSc: 6                  Ryan P Ellender

## 2017-12-17 NOTE — Anesthesia Preprocedure Evaluation (Addendum)
Anesthesia Evaluation  Patient identified by MRN, date of birth, ID band Patient awake    Reviewed: Allergy & Precautions, H&P , NPO status , Patient's Chart, lab work & pertinent test results, reviewed documented beta blocker date and time   Airway Mallampati: II   Neck ROM: full    Dental   Pulmonary neg pulmonary ROS,    breath sounds clear to auscultation       Cardiovascular hypertension, Pt. on home beta blockers and Pt. on medications +CHF and + DVT (RLE)   Rhythm:regular Rate:Normal  ECG: SR, rate 79   Neuro/Psych negative neurological ROS  negative psych ROS   GI/Hepatic negative GI ROS, Neg liver ROS,   Endo/Other  Morbid obesity  Renal/GU negative Renal ROS     Musculoskeletal  (+) Arthritis , Osteoarthritis,  History of gout   Abdominal (+) + obese,   Peds  Hematology  (+) anemia ,   Anesthesia Other Findings femur fracture  Reproductive/Obstetrics                           Anesthesia Physical  Anesthesia Plan  ASA: III  Anesthesia Plan: General   Post-op Pain Management:    Induction: Intravenous  PONV Risk Score and Plan: 2 and Ondansetron, Treatment may vary due to age or medical condition, Dexamethasone and Midazolam  Airway Management Planned: Oral ETT  Additional Equipment:   Intra-op Plan:   Post-operative Plan: Extubation in OR  Informed Consent: I have reviewed the patients History and Physical, chart, labs and discussed the procedure including the risks, benefits and alternatives for the proposed anesthesia with the patient or authorized representative who has indicated his/her understanding and acceptance.     Plan Discussed with: CRNA  Anesthesia Plan Comments:        Anesthesia Quick Evaluation

## 2017-12-17 NOTE — Anesthesia Procedure Notes (Signed)
Procedure Name: Intubation Date/Time: 12/17/2017 7:52 AM Performed by: Fransisca KaufmannMeyer, Ashlin Hidalgo E, CRNA Pre-anesthesia Checklist: Patient identified, Emergency Drugs available, Suction available and Patient being monitored Patient Re-evaluated:Patient Re-evaluated prior to induction Oxygen Delivery Method: Circle System Utilized Preoxygenation: Pre-oxygenation with 100% oxygen Induction Type: IV induction Ventilation: Mask ventilation without difficulty Grade View: Grade II Tube type: Oral Tube size: 8.0 mm Number of attempts: 1 Airway Equipment and Method: Stylet and Oral airway Placement Confirmation: ETT inserted through vocal cords under direct vision,  positive ETCO2 and breath sounds checked- equal and bilateral Secured at: 24 (at lips) cm Tube secured with: Tape Dental Injury: Teeth and Oropharynx as per pre-operative assessment  Difficulty Due To: Difficulty was anticipated, Difficult Airway- due to large tongue and Difficult Airway- due to anterior larynx

## 2017-12-17 NOTE — Progress Notes (Signed)
[facility] PROGRESS NOTE  Andre Hoffman:[id number] DOB: 11/12/1971 DOA: 12/11/2017  PCP: Georgann Housekeeper, MD  Brief History/Interval Summary: 46 year old male with a past medical history of hypertension, obesity, osteoarthritis previous left hip replacement in 2017 and right knee replacement in January 2018 presented after sustaining a mechanical fall while at work.  This resulted in distal right femur fracture.  Subsequently patient developed fever.  No etiology was found initially.  Lower extremity Doppler studies positive for DVT.  Now also experiencing polyarthritis.  Patient with history of gout.  Reason for Visit: Periprosthetic fracture of the distal right femur  Consultants: Orthopedics, hematology  Procedures:   Lower extremity venous Doppler Final Interpretation Right: There is evidence of acute DVT in the Popliteal vein, Posterior Tibial vein, and Peroneal vein. No cystic structure found in the popliteal fossa. Left: There is no evidence of deep vein thrombosis in the lower extremity. No cystic structure found in the popliteal fossa.  prbc transfusion on 12/28 1.  Retrograde intramedullary fixation of right femur. On 12/28 by Dr Linna Caprice 2.  Application of incisional wound VAC    Antibiotics: None  Subjective/Interval History: Fever seems has resolved, tmax 99.4 He returned from OR, currently feeling fine, denies pain, no sob, no chest pain    He states that his wrist joints and elbow joints are feeling stiff and has pain in these joints.  He reports a history of gout but does not take any definitive medication for this.   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  Objective:  Vital Signs  Vitals:   12/16/17 0459 12/16/17 1630 12/16/17 2100 12/17/17 0451  BP: (!) 157/77 (!) 181/81 (!) 177/85 (!) 156/78  Pulse: 84 94 91 86  Resp: 20 20 18 18   Temp: (!) 101.2 F (38.4 C) 99.9 F  (37.7 C) 99.7 F (37.6 C) 98.9 F (37.2 C)  TempSrc: Oral Oral Oral Oral  SpO2: 98% 99% 99% 100%  Weight:      Height:        Intake/Output Summary (Last 24 hours) at 12/17/2017 1027 Last data filed at 12/17/2017 0939 Gross per 24 hour  Intake 1640 ml  Output 1900 ml  Net -260 ml   Filed Weights   12/11/17 1010  Weight: (!) 149.7 kg (330 lb)    General appearance: Awake alert.  In no distress. Resp: Lungs are clear to auscultation bilaterally.  No wheezing rales or rhonchi. Cardio: S1-S2 is normal regular.  No S3 or S4.  No rubs murmurs or bruit GI: Abdomen remains soft.  Nontender nondistended  Extremities/musculoskeletal: Patient noted to have swelling of both his wrist joints and elbow joints.  Range of motion is restricted.  Warm to touch.  Right knee continues to be swollen.  Right hip post op changes No skin rashes Neurologic: No focal neurological deficits.  Lab Results:  Data Reviewed: I have personally reviewed following labs and imaging studies  CBC: Recent Labs  Lab 12/11/17 1038 12/13/17 2324 12/15/17 0735 12/16/17 0816 12/17/17 0618  WBC 6.2 10.3 10.2 11.1* 10.4  NEUTROABS 4.0 8.4*  --   --   --   HGB 13.8 9.9* 8.9* 9.2* 8.7*  HCT 39.5 29.8* 27.5* 26.0* 26.4*  MCV 97.1 100.0 100.0 98.9 98.1  PLT 274 201 221 283 335    Basic Metabolic Panel: Recent Labs  Lab 12/11/17 1038 12/13/17 2324 12/14/17 1810 12/15/17 0735 12/16/17  1610 12/17/17 0618  NA 138 136 138 138 136 137  K 3.9 3.6 3.9 3.9 3.9 4.2  CL 104 104 107 108 105 105  CO2 21* 23 24 23 23 23   GLUCOSE 120* 258* 136* 131* 130* 159*  BUN 21* 13 13 14 13 13   CREATININE 1.12 1.32* 1.23 1.12 1.09 0.98  CALCIUM 9.0 8.7* 8.6* 8.5* 8.5* 8.7*  MG 1.8  --   --   --   --   --     GFR: Estimated Creatinine Clearance: 145.5 mL/min (by C-G formula based on SCr of 0.98 mg/dL).   Recent Results (from the past 240 hour(s))  MRSA PCR Screening     Status: None   Collection Time: 12/13/17  9:51  AM  Result Value Ref Range Status   MRSA by PCR NEGATIVE NEGATIVE Final    Comment:        The GeneXpert MRSA Assay (FDA approved for NASAL specimens only), is one component of a comprehensive MRSA colonization surveillance program. It is not intended to diagnose MRSA infection nor to guide or monitor treatment for MRSA infections.   Culture, blood (routine x 2)     Status: None (Preliminary result)   Collection Time: 12/13/17 10:51 PM  Result Value Ref Range Status   Specimen Description BLOOD RIGHT ANTECUBITAL  Final   Special Requests   Final    BOTTLES DRAWN AEROBIC AND ANAEROBIC Blood Culture adequate volume   Culture NO GROWTH 2 DAYS  Final   Report Status PENDING  Incomplete  Culture, blood (routine x 2)     Status: None (Preliminary result)   Collection Time: 12/13/17 11:04 PM  Result Value Ref Range Status   Specimen Description BLOOD RIGHT HAND  Final   Special Requests   Final    BOTTLES DRAWN AEROBIC ONLY Blood Culture adequate volume   Culture NO GROWTH 2 DAYS  Final   Report Status PENDING  Incomplete  Culture, Urine     Status: None   Collection Time: 12/14/17  8:50 AM  Result Value Ref Range Status   Specimen Description URINE, CATHETERIZED  Final   Special Requests NONE  Final   Culture NO GROWTH  Final   Report Status 12/15/2017 FINAL  Final  Surgical PCR screen     Status: None   Collection Time: 12/17/17 12:40 AM  Result Value Ref Range Status   MRSA, PCR NEGATIVE NEGATIVE Final   Staphylococcus aureus NEGATIVE NEGATIVE Final    Comment: (NOTE) The Xpert SA Assay (FDA approved for NASAL specimens in patients 51 years of age and older), is one component of a comprehensive surveillance program. It is not intended to diagnose infection nor to guide or monitor treatment.       Radiology Studies: Dg C-arm 61-120 Min  Result Date: 12/17/2017 CLINICAL DATA:  Status post IM nail of right femur. EXAM: RIGHT FEMUR 2 VIEWS; DG C-ARM 61-120 MIN  COMPARISON:  12/11/2017 FINDINGS: Postoperative changes from ORIF of distal femur fracture identified. Interval placement of IM nail and screw device identified. The fracture fragments and hardware components appear to be in anatomic alignment. IMPRESSION: 1. Status post ORIF of distal femur fracture. Electronically Signed   By: Signa Kell M.D.   On: 12/17/2017 10:01   Dg Femur, Min 2 Views Right  Result Date: 12/17/2017 CLINICAL DATA:  Status post IM nail of right femur. EXAM: RIGHT FEMUR 2 VIEWS; DG C-ARM 61-120 MIN COMPARISON:  12/11/2017 FINDINGS: Postoperative changes from ORIF  of distal femur fracture identified. Interval placement of IM nail and screw device identified. The fracture fragments and hardware components appear to be in anatomic alignment. IMPRESSION: 1. Status post ORIF of distal femur fracture. Electronically Signed   By: Signa Kellaylor  Stroud M.D.   On: 12/17/2017 10:01     Medications:  Scheduled: . [MAR Hold] amLODipine  10 mg Oral Daily  . [MAR Hold] carvedilol  25 mg Oral BID  . [MAR Hold] hydrALAZINE  25 mg Oral Q8H  . [MAR Hold] lidocaine  1 patch Transdermal Q24H  . [MAR Hold] predniSONE  20 mg Oral BID  . [MAR Hold] senna  2 tablet Oral QHS   Continuous: . sodium chloride 75 mL/hr at 12/16/17 2144  . sodium chloride    . ceFAZolin    . ceFAZolin     PRN:0.9 % irrigation (POUR BTL), [MAR Hold] acetaminophen, [MAR Hold] diphenhydrAMINE, [MAR Hold]  HYDROmorphone (DILAUDID) injection, [MAR Hold] oxyCODONE  Assessment/Plan:  Active Problems:   Other fracture of right femur, initial encounter for closed fracture (HCC)   Stress fracture, right femur, initial encounter for fracture   Fever He continues to be febrile.  Infectious workup has been negative.  However he was found to have right lower extremity DVT which could be responsible for the fever.  He also appears to be experiencing an acute gout flare which could be another reason for fever.  Blood cultures  have been negative so far.  Treat with anti-inflammatory drugs for now.  Right lower extremity DVT Found to have distal in the right lower extremity.  Most likely due to his recent injury and immobilization.  Patient started on IV heparin.  Discussed with vascular surgery yesterday (Dr. Imogene Burnhen).  No role for IVC filter.   Hematology consulted, recommend  Start lovenox then transition to xarelto at discharge.    Polyarthritis Patient mentions history of gout.  Does not take allopurinol or any other medications for same.  Uric acid level noted to be 6.7.  Motrin ordered for 3 doses. Hesitate using it long-term due to his history of CHF.  Patient continues to have symptoms.  Steroids started on 12/27  Normocytic anemia Patient did have a drop in hemoglobin.  Hemoglobin actually quite similar to what it was back in January.  So his initial value could have been hemoconcentrated.  He could have also had some bleeding in his fracture location.  Otherwise no other overt bleeding has been noted.  Hemoglobin is stable today.  Anemia panel showed ferritin of 680.  B12 486.  Folate 6.3.  Continue to monitor.  S/p prbc transfusion on 12/28  Acute comminuted periprosthetic fracture of the distal right femur S/p 1.  Retrograde intramedullary fixation of right femur. 2.  Application of incisional wound VAC. On 12/28 by Dr Linna CapriceSwinteck   Mechanical fall at home Patient denies any dizziness or lightheadedness prior to his fall.  He was laughing hard and took a step, was not looking where he was going, and tripped resulting in this fall.  No syncopal episode.  Does not need any further workup at this time.  History of essential hypertension/Chronic Systolic CHF Continue home medications.  Monitor blood pressures closely.  Patient also mentions a history of CHF.  Last echocardiogram is from 2014 with an ejection fraction was noted to be 40-45%.  He has seen by Dr. Eldridge DaceVaranasi with cardiology previously. However does  not follow up on a regular basis.  This apparently is managed by his PCP.  Takes diuretics on a daily basis.  Cardiac status appears to be stable.  EKG does not show any ischemic changes.  He has had 2 orthopedic procedures within the last one year without any complications.  Seems to be well compensated at this time.  Outpatient monitoring by PCP as before. Resume furosemide postoperatively.  Morbid Obesity Body mass index is 42.37 kg/m.  DVT Prophylaxis: SCDs    Code Status: Full code Family Communication: Discussed with patient Disposition Plan: pending PT /OT eval.   Case discussed with orthopedics Dr Linna CapriceSwinteck and hematology oncology Dr Alvina FilbertGudena   LOS: 6 days   Albertine GratesFang Jahmad Petrich MD PhD  [facility] Pager [phone] 12/17/2017, 10:27 AM  If 7PM-7AM, please contact night-coverage at [email or web address], password [email or web address]

## 2017-12-18 LAB — CBC
HEMATOCRIT: 25.6 % — AB (ref 39.0–52.0)
HEMOGLOBIN: 8.5 g/dL — AB (ref 13.0–17.0)
MCH: 31.3 pg (ref 26.0–34.0)
MCHC: 33.2 g/dL (ref 30.0–36.0)
MCV: 94.1 fL (ref 78.0–100.0)
Platelets: 325 10*3/uL (ref 150–400)
RBC: 2.72 MIL/uL — AB (ref 4.22–5.81)
RDW: 15.2 % (ref 11.5–15.5)
WBC: 11.8 10*3/uL — ABNORMAL HIGH (ref 4.0–10.5)

## 2017-12-18 LAB — BASIC METABOLIC PANEL
Anion gap: 6 (ref 5–15)
BUN: 13 mg/dL (ref 6–20)
CHLORIDE: 107 mmol/L (ref 101–111)
CO2: 25 mmol/L (ref 22–32)
Calcium: 8.6 mg/dL — ABNORMAL LOW (ref 8.9–10.3)
Creatinine, Ser: 0.96 mg/dL (ref 0.61–1.24)
GFR calc Af Amer: >60 mL/min (ref >60)
GFR calc non Af Amer: >60 mL/min (ref >60)
GLUCOSE: 157 mg/dL — AB (ref 65–99)
POTASSIUM: 4.3 mmol/L (ref 3.5–5.1)
Sodium: 138 mmol/L (ref 135–145)

## 2017-12-18 LAB — POCT I-STAT 4, (NA,K, GLUC, HGB,HCT)
GLUCOSE: 177 mg/dL — AB (ref 65–99)
HEMATOCRIT: 25 % — AB (ref 39.0–52.0)
Hemoglobin: 8.5 g/dL — ABNORMAL LOW (ref 13.0–17.0)
POTASSIUM: 4.7 mmol/L (ref 3.5–5.1)
SODIUM: 138 mmol/L (ref 135–145)

## 2017-12-18 LAB — HEMOGLOBIN A1C
Hgb A1c MFr Bld: 5.6 % (ref 4.8–5.6)
Mean Plasma Glucose: 114.02 mg/dL

## 2017-12-18 MED ORDER — SENNOSIDES-DOCUSATE SODIUM 8.6-50 MG PO TABS
1.0000 | ORAL_TABLET | Freq: Two times a day (BID) | ORAL | Status: DC
  Administered 2017-12-18 – 2017-12-20 (×4): 1 via ORAL
  Filled 2017-12-18 (×4): qty 1

## 2017-12-18 MED ORDER — LOSARTAN POTASSIUM 50 MG PO TABS
100.0000 mg | ORAL_TABLET | Freq: Every day | ORAL | Status: DC
  Administered 2017-12-18 – 2017-12-20 (×3): 100 mg via ORAL
  Filled 2017-12-18 (×3): qty 2

## 2017-12-18 MED ORDER — FUROSEMIDE 20 MG PO TABS
20.0000 mg | ORAL_TABLET | Freq: Every day | ORAL | Status: DC
  Administered 2017-12-18 – 2017-12-20 (×3): 20 mg via ORAL
  Filled 2017-12-18 (×3): qty 1

## 2017-12-18 MED ORDER — PREDNISONE 20 MG PO TABS
20.0000 mg | ORAL_TABLET | Freq: Every day | ORAL | Status: DC
  Administered 2017-12-19 – 2017-12-20 (×2): 20 mg via ORAL
  Filled 2017-12-18 (×2): qty 1

## 2017-12-18 NOTE — Progress Notes (Signed)
[facility] PROGRESS NOTE  Andre Hoffman GNF:[id number] DOB: [birth year] DOA: 12/11/2017  PCP: Georgann HousekeeperHusain, Karrar, MD  Brief History/Interval Summary: 46 year old male with a past medical history of hypertension, obesity, osteoarthritis previous left hip replacement in 2017 and right knee replacement in January 2018 presented after sustaining a mechanical fall while at work.  This resulted in distal right femur fracture.  Subsequently patient developed fever.  No etiology was found initially.  Lower extremity Doppler studies positive for DVT.  Now also experiencing polyarthritis.  Patient with history of gout.  Reason for Visit: Periprosthetic fracture of the distal right femur  Consultants: Orthopedics, hematology  Procedures:   Lower extremity venous Doppler Final Interpretation Right: There is evidence of acute DVT in the Popliteal vein, Posterior Tibial vein, and Peroneal vein. No cystic structure found in the popliteal fossa. Left: There is no evidence of deep vein thrombosis in the lower extremity. No cystic structure found in the popliteal fossa.  prbc transfusion on 12/28 1.  Retrograde intramedullary fixation of right femur. On 12/28 by Dr Linna CapriceSwinteck 2.  Application of incisional wound VAC    Antibiotics: None  Subjective/Interval History: No more fever, tmax 98.8 Post op day one, he is sitting up in chair, denies leg pain, no sob, no chest pain his wrist joints and elbow joints  Pain and stiffness has resolved, he reports felt immediate response after the first dose of steroids. He reports a history of gout in toes but has not had an attack in the last 5646yrs since he cut down on bear and meat intake.    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  Objective:  Vital Signs  Vitals:   12/17/17 1526 12/17/17 2018 12/18/17 0021 12/18/17 0510  BP: (!) 162/74 (!) 159/74 139/69 (!) 171/89  Pulse: 84 95  85 90  Resp:  20 17 19   Temp: 99 F (37.2 C)  98 F (36.7 C) 98.6 F (37 C)  TempSrc: Oral  Oral Oral  SpO2: 97% 100% 96% 100%  Weight:      Height:        Intake/Output Summary (Last 24 hours) at 12/18/2017 0819 Last data filed at 12/18/2017 0516 Gross per 24 hour  Intake 2935 ml  Output 3350 ml  Net -415 ml   Filed Weights   12/11/17 1010  Weight: (!) 149.7 kg (330 lb)    General appearance: Awake alert.  In no distress. Resp: Lungs are clear to auscultation bilaterally.  No wheezing rales or rhonchi. Cardio: S1-S2 is normal regular.  No S3 or S4.  No rubs murmurs or bruit GI: Abdomen remains soft.  Nontender nondistended  Extremities/musculoskeletal: swelling of both his wrist joints and elbow joints has resolved.  Range of motion has improved. Right knee continues to be swollen.  Right hip post op changes with wound vac No skin rashes Neurologic: No focal neurological deficits.  Lab Results:  Data Reviewed: I have personally reviewed following labs and imaging studies  CBC: Recent Labs  Lab 12/11/17 1038 12/13/17 2324 12/15/17 0735 12/16/17 0816 12/17/17 0618 12/17/17 0952  WBC 6.2 10.3 10.2 11.1* 10.4  --   NEUTROABS 4.0 8.4*  --   --   --   --   HGB 13.8 9.9* 8.9* 9.2* 8.7* 8.5*  HCT 39.5 29.8* 27.5* 26.0* 26.4* 25.0*  MCV 97.1 100.0 100.0 98.9 98.1  --   PLT 274 201 221  283 335  --     Basic Metabolic Panel: Recent Labs  Lab 12/11/17 1038 12/13/17 2324 12/14/17 1810 12/15/17 0735 12/16/17 0816 12/17/17 0618 12/17/17 0952  NA 138 136 138 138 136 137 138  K 3.9 3.6 3.9 3.9 3.9 4.2 4.7  CL 104 104 107 108 105 105  --   CO2 21* 23 24 23 23 23   --   GLUCOSE 120* 258* 136* 131* 130* 159* 177*  BUN 21* 13 13 14 13 13   --   CREATININE 1.12 1.32* 1.23 1.12 1.09 0.98  --   CALCIUM 9.0 8.7* 8.6* 8.5* 8.5* 8.7*  --   MG 1.8  --   --   --   --   --   --     GFR: Estimated Creatinine Clearance: 145.5 mL/min (by C-G formula based on SCr of 0.98  mg/dL).   Recent Results (from the past 240 hour(s))  MRSA PCR Screening     Status: None   Collection Time: 12/13/17  9:51 AM  Result Value Ref Range Status   MRSA by PCR NEGATIVE NEGATIVE Final    Comment:        The GeneXpert MRSA Assay (FDA approved for NASAL specimens only), is one component of a comprehensive MRSA colonization surveillance program. It is not intended to diagnose MRSA infection nor to guide or monitor treatment for MRSA infections.   Culture, blood (routine x 2)     Status: None (Preliminary result)   Collection Time: 12/13/17 10:51 PM  Result Value Ref Range Status   Specimen Description BLOOD RIGHT ANTECUBITAL  Final   Special Requests   Final    BOTTLES DRAWN AEROBIC AND ANAEROBIC Blood Culture adequate volume   Culture NO GROWTH 3 DAYS  Final   Report Status PENDING  Incomplete  Culture, blood (routine x 2)     Status: None (Preliminary result)   Collection Time: 12/13/17 11:04 PM  Result Value Ref Range Status   Specimen Description BLOOD RIGHT HAND  Final   Special Requests   Final    BOTTLES DRAWN AEROBIC ONLY Blood Culture adequate volume   Culture NO GROWTH 3 DAYS  Final   Report Status PENDING  Incomplete  Culture, Urine     Status: None   Collection Time: 12/14/17  8:50 AM  Result Value Ref Range Status   Specimen Description URINE, CATHETERIZED  Final   Special Requests NONE  Final   Culture NO GROWTH  Final   Report Status 12/15/2017 FINAL  Final  Surgical PCR screen     Status: None   Collection Time: 12/17/17 12:40 AM  Result Value Ref Range Status   MRSA, PCR NEGATIVE NEGATIVE Final   Staphylococcus aureus NEGATIVE NEGATIVE Final    Comment: (NOTE) The Xpert SA Assay (FDA approved for NASAL specimens in patients 39 years of age and older), is one component of a comprehensive surveillance program. It is not intended to diagnose infection nor to guide or monitor treatment.       Radiology Studies: Dg C-arm 61-120  Min  Result Date: 12/17/2017 CLINICAL DATA:  Status post IM nail of right femur. EXAM: RIGHT FEMUR 2 VIEWS; DG C-ARM 61-120 MIN COMPARISON:  12/11/2017 FINDINGS: Postoperative changes from ORIF of distal femur fracture identified. Interval placement of IM nail and screw device identified. The fracture fragments and hardware components appear to be in anatomic alignment. IMPRESSION: 1. Status post ORIF of distal femur fracture. Electronically Signed   By: Ladona Ridgel  Bradly Chris M.D.   On: 12/17/2017 10:01   Dg Femur, Min 2 Views Right  Result Date: 12/17/2017 CLINICAL DATA:  Status post IM nail of right femur. EXAM: RIGHT FEMUR 2 VIEWS; DG C-ARM 61-120 MIN COMPARISON:  12/11/2017 FINDINGS: Postoperative changes from ORIF of distal femur fracture identified. Interval placement of IM nail and screw device identified. The fracture fragments and hardware components appear to be in anatomic alignment. IMPRESSION: 1. Status post ORIF of distal femur fracture. Electronically Signed   By: Signa Kell M.D.   On: 12/17/2017 10:01   Dg Femur [place] 2 Views Right  Result Date: 12/17/2017 CLINICAL DATA:  46 year old male status post ORIF of distal femur fracture. EXAM: RIGHT FEMUR PORTABLE 2 VIEW COMPARISON:  Intraoperative images 0 942 hours today. Preoperative femur series [id number]. FINDINGS: Portable AP and cross-table lateral views of the right femur. Preexisting right total knee arthroplasty. Severely comminuted distal right femur metadiaphysis fracture now traversed by an intramedullary rod. Single proximal interlocking cortical screw at the level of the proximal shaft. Multiple distal interlocking cortical screws. Improved fracture and butterfly fragment alignment compare to 12/11/2017. Right knee joint postoperative intra-articular gas and fluid. The right knee arthroplasty hardware appears stable. Patella appears intact. Right hip joint appears stable and intact. Calcified femoral artery atherosclerosis.  IMPRESSION: 1. ORIF of the right femur with improved alignment about the severely comminuted distal metadiaphysis fracture. 2. Preexisting right total knee arthroplasty. Postoperative air in fluid in the right knee joint. Electronically Signed   By: Odessa Fleming M.D.   On: 12/17/2017 11:41     Medications:  Scheduled: . amLODipine  10 mg Oral Daily  . carvedilol  25 mg Oral BID  . enoxaparin (LOVENOX) injection  1 mg/kg Subcutaneous Q12H  . hydrALAZINE  25 mg Oral Q8H  . lidocaine  1 patch Transdermal Q24H  . polyethylene glycol  17 g Oral Daily  . predniSONE  20 mg Oral BID  . senna  2 tablet Oral QHS   Continuous: . sodium chloride 75 mL/hr at 12/17/17 2021   ZOX:[id number] **OR** acetaminophen, diphenhydrAMINE, HYDROcodone-acetaminophen, HYDROmorphone (DILAUDID) injection, menthol-cetylpyridinium **OR** phenol, metoCLOPramide **OR** metoCLOPramide (REGLAN) injection, morphine injection, ondansetron **OR** ondansetron (ZOFRAN) IV, oxyCODONE  Assessment/Plan:  Active Problems:   Other fracture of right femur, initial encounter for closed fracture [facility])   Stress fracture, right femur, initial encounter for fracture   Closed fracture of right distal femur (HCC)   Fever Fever resolved,  Infectious workup has been negative.  Blood cultures have been negative so far. However he was found to have right lower extremity DVT which could be responsible for the fever.   He also appears to be experiencing an acute gout flare which could be another reason for fever.     Treat with anti-inflammatory drugs for now.  Right lower extremity DVT Found to have distal in the right lower extremity.  Most likely due to his recent injury and immobilization.  Patient started on IV heparin.  Discussed with vascular surgery yesterday (Dr. Imogene Burn).  No role for IVC filter.   Hematology consulted, recommend  Start lovenox then transition to xarelto at discharge. Case manager notified need to discharge on  xarelto.   Polyarthritis Patient mentions history of gout.  Does not take allopurinol or any other medications for same.  Uric acid level noted to be 6.7.  Motrin ordered for 3 doses. Hesitate using it long-term due to his history of CHF.    Steroids started on 12/27  With  good response, symptom has resolved, taper steroids, he need to follow up with pmd, consider rheumatology referral. I have discussed with the patient , he expressed understanding.  Normocytic anemia Patient did have a drop in hemoglobin.  Hemoglobin actually quite similar to what it was back in January.  So his initial value could have been hemoconcentrated.  He could have also had some bleeding in his fracture location.  Otherwise no other overt bleeding has been noted.  Hemoglobin is stable today.  Anemia panel showed ferritin of 680.  B12 486.  Folate 6.3.  Continue to monitor.  S/p prbc transfusion on 12/28 Patient is going to follow with hematology/oncology Dr Pamelia HoitGudena.  Acute comminuted periprosthetic fracture of the distal right femur S/p 1.  Retrograde intramedullary fixation of right femur. 2.  Application of incisional wound VAC. On 12/28 by Dr Linna CapriceSwinteck Wound care/weight bearing per ortho   Mechanical fall at work Patient denies any dizziness or lightheadedness prior to his fall.  He was laughing hard and took a step, was not looking where he was going, and tripped resulting in this fall.  No syncopal episode.  Does not need any further workup at this time.  History of essential hypertension/Chronic Systolic CHF Continue home medications.  Monitor blood pressures closely.  Patient also mentions a history of CHF.  Last echocardiogram is from 2014 with an ejection fraction was noted to be 40-45%.  He has seen by Dr. Eldridge DaceVaranasi with cardiology previously. However does not follow up on a regular basis.  This apparently is managed by his PCP.  Takes diuretics on a daily basis.  Cardiac status appears to be stable.  EKG does  not show any ischemic changes.  He has had 2 orthopedic procedures within the last one year without any complications.  Seems to be well compensated at this time.  Outpatient monitoring by PCP as before. Resume furosemide postoperatively.  HTN; resume home meds norvasc, coreg, cozaar, hydralazine, lasix  Morbid Obesity Body mass index is 42.37 kg/m.  DVT Prophylaxis: SCDs /lovenox   Code Status: Full code Family Communication: Discussed with patient Disposition Plan: home with home health with ortho clearance      LOS: 7 days   Albertine GratesFang Brookelyn Gaynor MD PhD  [facility] Pager [phone] 12/18/2017, 8:19 AM  If 7PM-7AM, please contact night-coverage at [email or web address], password [facility]

## 2017-12-18 NOTE — Evaluation (Signed)
Occupational Therapy Evaluation Patient Details Name: Andre Hoffman MRN: [id number] DOB: 1971-01-24 Today's Date: 12/18/2017    History of Present Illness 46 year old male with a past medical history of hypertension, obesity, osteoarthritis previous left hip replacement in 2017 and right knee replacement in January 2018 presented after sustaining a mechanical fall while at work. This resulted in distal right femur fracture. Now s/p INTRAMEDULLARY (IM) RETROGRADE FEMORAL NAILING (Right) on 12/28.   Clinical Impression   Pt reports he was independent with ADL PTA. Currently pt overall max assist +2 for very short distance functional mobility with difficulty maintaining RLE TDWB. Pt requires min assist for seated UB ADL and max assist for LB ADL. Pt planning to d/c home with 24/7 supervision from family initially. Recommending HHOT for follow up to maximize independence and safety with ADL and functional mobility upon return home. Pending progress, pt may need post acute rehab. Pt would benefit from continued skilled OT to address established goals.    Follow Up Recommendations  Home health OT;Supervision/Assistance - 24 hour    Equipment Recommendations  None recommended by OT    Recommendations for Other Services       Precautions / Restrictions Precautions Precautions: Fall Restrictions Weight Bearing Restrictions: Yes RLE Weight Bearing: Touchdown weight bearing      Mobility Bed Mobility Overal bed mobility: Needs Assistance Bed Mobility: Supine to Sit     Supine to sit: Mod assist     General bed mobility comments: Assist for RLE. Cues for hand placement and scooting out to EOB. Pt able to manage trunk without physical assist.  Transfers Overall transfer level: Needs assistance Equipment used: Rolling walker (2 wheeled) Transfers: Sit to/from [employer] CorporationStand;Stand Pivot Transfers Sit to Stand: Max assist;+2 physical assistance;From elevated surface Stand pivot transfers: Max  assist;+2 physical assistance       General transfer comment: to boost up from elevated EOB pt required max assist +2. Cues for hand placement and technique and maintaining RLE TDWB.    Balance Overall balance assessment: Needs assistance Sitting-balance support: Feet supported;Bilateral upper extremity supported Sitting balance-Leahy Scale: Fair     Standing balance support: Single extremity supported Standing balance-Leahy Scale: Fair                             ADL either performed or assessed with clinical judgement   ADL Overall ADL's : Needs assistance/impaired Eating/Feeding: Set up;Sitting   Grooming: Set up;Supervision/safety;Wash/dry face;Sitting   Upper Body Bathing: Minimal assistance;Sitting   Lower Body Bathing: Maximal assistance;Sit to/from stand   Upper Body Dressing : Minimal assistance;Sitting   Lower Body Dressing: Maximal assistance;Sit to/from stand Lower Body Dressing Details (indicate cue type and reason): To don pants Toilet Transfer: Maximal assistance;+2 for physical assistance;Stand-pivot;BSC;RW Toilet Transfer Details (indicate cue type and reason): Simulated by transfer EOB to chair         Functional mobility during ADLs: Maximal assistance;+2 for physical assistance;Rolling walker(for very short distance) General ADL Comments: Pt with difficulty maintaining RLE TDWB during functional tasks.     Vision         Perception     Praxis      Pertinent Vitals/Pain Pain Assessment: 0-10 Pain Score: 9  Pain Location: R leg Pain Descriptors / Indicators: Aching;Grimacing Pain Intervention(s): Monitored during session;Limited activity within patient's tolerance;Repositioned     Hand Dominance Left   Extremity/Trunk Assessment Upper Extremity Assessment Upper Extremity Assessment: Generalized weakness   Lower Extremity  Assessment Lower Extremity Assessment: Defer to PT evaluation       Communication  Communication Communication: No difficulties   Cognition Arousal/Alertness: Awake/alert Behavior During Therapy: WFL for tasks assessed/performed Overall Cognitive Status: Within Functional Limits for tasks assessed                                     General Comments       Exercises     Shoulder Instructions      Home Living Family/patient expects to be discharged to:: Private residence Living Arrangements: Spouse/significant other;Children Available Help at Discharge: Family;Available PRN/intermittently Type of Home: House Home Access: Stairs to enter Entrance Stairs-Number of Steps: 5 Entrance Stairs-Rails: None Home Layout: Two level;Bed/bath upstairs Alternate Level Stairs-Number of Steps: flight with landing   Bathroom Shower/Tub: Tub/shower unit;Curtain   Bathroom Toilet: Standard     Home Equipment: Bedside commode;Walker - 2 wheels;Crutches;Tub bench          Prior Functioning/Environment Level of Independence: Independent        Comments: working, driving        OT Problem List: Decreased strength;Decreased activity tolerance;Impaired balance (sitting and/or standing);Decreased knowledge of use of DME or AE;Decreased knowledge of precautions;Obesity;Pain      OT Treatment/Interventions: Self-care/ADL training;Energy conservation;DME and/or AE instruction;Therapeutic activities;Patient/family education;Balance training    OT Goals(Current goals can be found in the care plan section) Acute Rehab OT Goals Patient Stated Goal: get better and return home OT Goal Formulation: With patient Time For Goal Achievement: 01/01/18 Potential to Achieve Goals: Good ADL Goals Pt Will Perform Lower Body Bathing: with min guard assist;sit to/from stand(with or without AE) Pt Will Perform Lower Body Dressing: with min guard assist;sit to/from stand(with or without AE) Pt Will Transfer to Toilet: with min guard assist;ambulating;bedside commode Pt  Will Perform Toileting - Clothing Manipulation and hygiene: with min guard assist;sit to/from stand Pt Will Perform Tub/Shower Transfer: Tub transfer;with min guard assist;ambulating;tub bench;rolling walker  OT Frequency: Min 2X/week   Barriers to D/C: Inaccessible home environment  stairs outside and inside home.       Co-evaluation PT/OT/SLP Co-Evaluation/Treatment: Yes Reason for Co-Treatment: For patient/therapist safety;To address functional/ADL transfers   OT goals addressed during session: ADL's and self-care      AM-PAC PT "6 Clicks" Daily Activity     Outcome Measure Help from another person eating meals?: None Help from another person taking care of personal grooming?: A Little Help from another person toileting, which includes using toliet, bedpan, or urinal?: A Lot Help from another person bathing (including washing, rinsing, drying)?: A Lot Help from another person to put on and taking off regular upper body clothing?: A Little Help from another person to put on and taking off regular lower body clothing?: A Lot 6 Click Score: 16   End of Session Equipment Utilized During Treatment: Gait belt;Rolling walker  Activity Tolerance: Patient tolerated treatment well Patient left: in chair;with call bell/phone within reach  OT Visit Diagnosis: Unsteadiness on feet (R26.81);Other abnormalities of gait and mobility (R26.89);History of falling (Z91.81);Muscle weakness (generalized) (M62.81);Pain Pain - Right/Left: Right Pain - part of body: Leg                Time: 1413-1440 OT Time Calculation (min): 27 min Charges:  OT General Charges $OT Visit: 1 Visit OT Evaluation $OT Eval Moderate Complexity: 1 Mod G-Codes:     Dorota Heinrichs A. Brett Albinooffey, M.S.,  OTR/L Pager: [phone]  Gaye Alken 12/18/2017, 2:53 PM

## 2017-12-18 NOTE — Progress Notes (Signed)
Pt left wrist PIV came out, pt still has left forearm PIV

## 2017-12-18 NOTE — Evaluation (Signed)
Physical Therapy Evaluation Patient Details Name: Andre Hoffman MRN: [id number] DOB: [birth year] Today's Date: 12/18/2017   History of Present Illness  46 year old male with a past medical history of hypertension, obesity, osteoarthritis previous left hip replacement in 2017 and right knee replacement in January 2018 presented after sustaining a mechanical fall while at work. This resulted in distal right femur fracture. Now s/p INTRAMEDULLARY (IM) RETROGRADE FEMORAL NAILING (Right) on 12/28.     Clinical Impression  Patient is s/p above surgery resulting in functional limitations due to the deficits listed below (see PT Problem List). PTA, pt was independent with all mobility active and working. Patient lives with wife in 2 story home with stairs to enter. Upon eval, patient presents with high levels of post op pain and weakness in upper and lower extremity and fatigue that limits his mobility at this time. Currently max A x2 for out of bed mobility. Patient states strong preference to return home with HHPT, which I feel is only safely attainable with marked progression with therapy over next several visits. Discussed with patient and will continue to improve functional mobility as tolerated.  Patient will benefit from skilled PT to increase their independence and safety with mobility to allow discharge to the venue listed below.       Follow Up Recommendations Home health PT;Supervision/Assistance - 24 hour    Equipment Recommendations  3in1 (PT)    Recommendations for Other Services       Precautions / Restrictions Precautions Precautions: Fall Restrictions Weight Bearing Restrictions: Yes RLE Weight Bearing: Touchdown weight bearing      Mobility  Bed Mobility Overal bed mobility: Needs Assistance Bed Mobility: Supine to Sit     Supine to sit: Mod assist     General bed mobility comments: Assist for RLE. Cues for hand placement and scooting out to EOB. Pt able to manage  trunk without physical assist.  Transfers Overall transfer level: Needs assistance Equipment used: Rolling walker (2 wheeled) Transfers: Sit to/from BJ'sStand;Stand Pivot Transfers Sit to Stand: Max assist;+2 physical assistance;From elevated surface Stand pivot transfers: Max assist;+2 physical assistance       General transfer comment: max A x2 to power up into stand pivot, cues for techinque and mintaing RLE TDWB and RUE WB status  Ambulation/Gait             General Gait Details: unable  Stairs            Wheelchair Mobility    Modified Rankin (Stroke Patients Only)       Balance Overall balance assessment: Needs assistance Sitting-balance support: Feet supported;Bilateral upper extremity supported Sitting balance-Leahy Scale: Fair     Standing balance support: Single extremity supported Standing balance-Leahy Scale: Fair                               Pertinent Vitals/Pain Pain Assessment: 0-10 Pain Score: 9  Pain Location: R leg Pain Descriptors / Indicators: Aching;Grimacing Pain Intervention(s): Limited activity within patient's tolerance;Monitored during session;Repositioned    Home Living Family/patient expects to be discharged to:: Private residence Living Arrangements: Spouse/significant other;Children Available Help at Discharge: Family;Available PRN/intermittently Type of Home: House Home Access: Stairs to enter Entrance Stairs-Rails: None Entrance Stairs-Number of Steps: 5 Home Layout: Two level;Bed/bath upstairs Home Equipment: Bedside commode;Walker - 2 wheels;Crutches;Tub bench      Prior Function Level of Independence: Independent         Comments: working,  driving     Hand Dominance   Dominant Hand: Left    Extremity/Trunk Assessment   Upper Extremity Assessment Upper Extremity Assessment: Generalized weakness    Lower Extremity Assessment Lower Extremity Assessment: Generalized weakness        Communication   Communication: No difficulties  Cognition Arousal/Alertness: Awake/alert Behavior During Therapy: WFL for tasks assessed/performed Overall Cognitive Status: Within Functional Limits for tasks assessed                                        General Comments      Exercises General Exercises - Lower Extremity Ankle Circles/Pumps: AAROM;Both;20 reps   Assessment/Plan    PT Assessment Patient needs continued PT services  PT Problem List Decreased strength;Decreased range of motion;Decreased activity tolerance;Decreased balance;Decreased mobility;Decreased coordination;Decreased knowledge of use of DME;Pain       PT Treatment Interventions DME instruction;Gait training;Stair training;Functional mobility training;Therapeutic activities;Therapeutic exercise    PT Goals (Current goals can be found in the Care Plan section)  Acute Rehab PT Goals Patient Stated Goal: get better and return home PT Goal Formulation: With patient Time For Goal Achievement: 12/25/17 Potential to Achieve Goals: Fair    Frequency 7X/week   Barriers to discharge Inaccessible home environment 5 stairs to enter    Co-evaluation PT/OT/SLP Co-Evaluation/Treatment: Yes Reason for Co-Treatment: For patient/therapist safety;To address functional/ADL transfers PT goals addressed during session: Mobility/safety with mobility;Proper use of DME;Strengthening/ROM OT goals addressed during session: ADL's and self-care       AM-PAC PT "6 Clicks" Daily Activity  Outcome Measure Difficulty turning over in bed (including adjusting bedclothes, sheets and blankets)?: Unable Difficulty moving from lying on back to sitting on the side of the bed? : Unable Difficulty sitting down on and standing up from a chair with arms (e.g., wheelchair, bedside commode, etc,.)?: Unable Help needed moving to and from a bed to chair (including a wheelchair)?: A Little Help needed walking in hospital  room?: A Lot Help needed climbing 3-5 steps with a railing? : A Lot 6 Click Score: 10    End of Session Equipment Utilized During Treatment: Gait belt Activity Tolerance: Patient limited by pain;Patient limited by fatigue Patient left: in chair;with call bell/phone within reach Nurse Communication: Mobility status PT Visit Diagnosis: Unsteadiness on feet (R26.81);Other abnormalities of gait and mobility (R26.89);Pain Pain - Right/Left: Right Pain - part of body: Arm;Leg    Time: [id number] PT Time Calculation (min) (ACUTE ONLY): 36 min   Charges:   PT Evaluation $PT Eval Moderate Complexity: 1 Mod     PT G Codes:       Etta GrandchildSean Dondrell Loudermilk, PT, DPT Acute Rehab Services Pager: [phone]    Etta GrandchildSean  Revan Gendron 12/18/2017, 4:50 PM

## 2017-12-18 NOTE — Progress Notes (Signed)
Subjective: 1 Day Post-Op Procedure(s) (LRB): INTRAMEDULLARY (IM) RETROGRADE FEMORAL NAILING (Right) Patient reports pain as well controlled.  Tolerating PO's. Progress with PT. Denies SOB,CO,OR calf pain.  Objective: Vital signs in last 24 hours: Temp:  [97.9 F (36.6 C)-99.4 F (37.4 C)] 98.6 F (37 C) (12/29 0510) Pulse Rate:  [67-95] 90 (12/29 0510) Resp:  [15-23] 19 (12/29 0510) BP: (96-173)/(64-89) 171/89 (12/29 0510) SpO2:  [93 %-100 %] 100 % (12/29 0510)  Intake/Output from previous day: 12/28 0701 - 12/29 0700 In: 2935 [P.O.:240; I.V.:2000; Blood:345; IV Piggyback:50] Out: 3350 [Urine:2800; Blood:550] Intake/Output this shift: No intake/output data recorded.  Recent Labs    12/16/17 0816 12/17/17 0618 12/17/17 0952 12/18/17 0744  HGB 9.2* 8.7* 8.5* 8.5*   Recent Labs    12/17/17 0618 12/17/17 0952 12/18/17 0744  WBC 10.4  --  11.8*  RBC 2.69*  --  2.72*  HCT 26.4* 25.0* 25.6*  PLT 335  --  325   Recent Labs    12/17/17 0618 12/17/17 0952 12/18/17 0744  NA 137 138 138  K 4.2 4.7 4.3  CL 105  --  107  CO2 23  --  25  BUN 13  --  13  CREATININE 0.98  --  0.96  GLUCOSE 159* 177* 157*  CALCIUM 8.7*  --  8.6*   No results for input(s): LABPT, INR in the last 72 hours.  Well nourished. Alert and oriented x3. RRR, Lungs clear, BS x4. Abdomen soft and non tender. Right Calf soft and non tender. Right knee dressing C/D/I. No DVT signs. Compartment soft. No signs of infection.  Right LE grossly neurovascular intact.wound vac in place.  Assessment/Plan: 1 Day Post-Op Procedure(s) (LRB): INTRAMEDULLARY (IM) RETROGRADE FEMORAL NAILING (Right) On medicine service Hemo/onc consulted for anticoagulation (Lovenox to [place]Xeralto) Change wound vac to Prevena prior to d/c.  Continue current care TDWB  Halana Deisher L 12/18/2017, 10:04 AM

## 2017-12-19 ENCOUNTER — Inpatient Hospital Stay (HOSPITAL_COMMUNITY): Payer: 59

## 2017-12-19 LAB — CBC
HEMATOCRIT: 25.9 % — AB (ref 39.0–52.0)
Hemoglobin: 8.6 g/dL — ABNORMAL LOW (ref 13.0–17.0)
MCH: 31.7 pg (ref 26.0–34.0)
MCHC: 33.2 g/dL (ref 30.0–36.0)
MCV: 95.6 fL (ref 78.0–100.0)
PLATELETS: 368 10*3/uL (ref 150–400)
RBC: 2.71 MIL/uL — ABNORMAL LOW (ref 4.22–5.81)
RDW: 15.6 % — AB (ref 11.5–15.5)
WBC: 11.3 10*3/uL — AB (ref 4.0–10.5)

## 2017-12-19 LAB — CULTURE, BLOOD (ROUTINE X 2)
CULTURE: NO GROWTH
CULTURE: NO GROWTH
SPECIAL REQUESTS: ADEQUATE
SPECIAL REQUESTS: ADEQUATE

## 2017-12-19 LAB — BASIC METABOLIC PANEL
Anion gap: 8 (ref 5–15)
BUN: 14 mg/dL (ref 6–20)
CALCIUM: 8.2 mg/dL — AB (ref 8.9–10.3)
CO2: 25 mmol/L (ref 22–32)
CREATININE: 0.96 mg/dL (ref 0.61–1.24)
Chloride: 102 mmol/L (ref 101–111)
GFR calc Af Amer: >60 mL/min (ref >60)
GLUCOSE: 126 mg/dL — AB (ref 65–99)
Potassium: 3.6 mmol/L (ref 3.5–5.1)
Sodium: 135 mmol/L (ref 135–145)

## 2017-12-19 NOTE — Progress Notes (Signed)
Physical Therapy Treatment Patient Details Name: Andre Hoffman MRN: [id number] DOB: Jul 06, 1971 Today's Date: 12/19/2017    History of Present Illness 46 year old male with a past medical history of hypertension, obesity, osteoarthritis previous left hip replacement in 2017 and right knee replacement in January 2018 presented after sustaining a mechanical fall while at work. This resulted in distal right femur fracture. Now s/p INTRAMEDULLARY (IM) RETROGRADE FEMORAL NAILING (Right) on 12/28.    PT Comments    Continuing work on functional mobility and activity tolerance;  Shows improvement in bed mobility today, needing extra time, but able to get to sit from supine with min assist; Pt is asking about getting a hospital bed for home -- a reasonable request;   Limited session to transfer training today because Andre Hoffman, Ortho PA ordered x-ray of R wrist and hand; if x-rays are clear, he will likely be WBAT of hand and wrist -- still, if he does not tolerate much weight at hand and wrist, will consider a platform RW;  Andre Hoffman very much wants to be able to dc home.  Follow Up Recommendations  Home health PT;Supervision/Assistance - 24 hour     Equipment Recommendations  3in1 (PT);Hospital bed(may consider platform for RW; WC if needed)    Recommendations for Other Services       Precautions / Restrictions Precautions Precautions: Fall Restrictions Weight Bearing Restrictions: Yes RLE Weight Bearing: Touchdown weight bearing Other Position/Activity Restrictions: RUE as NWB per PA until results of xray obtained (order placed today 12/19/17)    Mobility  Bed Mobility Overal bed mobility: Needs Assistance Bed Mobility: Supine to Sit     Supine to sit: HOB elevated;Min assist     General bed mobility comments: Assist for RLE. Cues for hand placement and scooting out to EOB. Inefficient movement, but Pt able to manage trunk without physical assist. pt. reports wife is planning on  having a hospital bed for home so allowed use of bed rails. will likely exit on R side at home  Transfers Overall transfer level: Needs assistance Equipment used: None(2 person assist) Transfers: Sit to/from Stand;Stand Pivot Transfers Sit to Stand: Mod assist;+2 physical assistance Stand pivot transfers: +2 physical assistance;Mod assist       General transfer comment: Mod assist x2 to power up into stand pivot, cues for techinque and mintaing RLE TDWB and RUE WB status  Ambulation/Gait                 Stairs            Wheelchair Mobility    Modified Rankin (Stroke Patients Only)       Balance     Sitting balance-Leahy Scale: Fair       Standing balance-Leahy Scale: Poor                              Cognition Arousal/Alertness: Awake/alert Behavior During Therapy: WFL for tasks assessed/performed Overall Cognitive Status: Within Functional Limits for tasks assessed                                        Exercises      General Comments        Pertinent Vitals/Pain Pain Assessment: 0-10 Pain Score: 8  Pain Location: R leg Pain Descriptors / Indicators: Aching Pain Intervention(s): Monitored during session;RN gave pain meds  during session    Home Living                      Prior Function            PT Goals (current goals can now be found in the care plan section) Acute Rehab PT Goals Patient Stated Goal: get better and return home PT Goal Formulation: With patient Time For Goal Achievement: 12/25/17 Potential to Achieve Goals: Fair Progress towards PT goals: Progressing toward goals(Slowly)    Frequency    7X/week      PT Plan Current plan remains appropriate    Co-evaluation PT/OT/SLP Co-Evaluation/Treatment: Yes Reason for Co-Treatment: For patient/therapist safety PT goals addressed during session: Mobility/safety with mobility OT goals addressed during session: ADL's and  self-care      AM-PAC PT "6 Clicks" Daily Activity  Outcome Measure  Difficulty turning over in bed (including adjusting bedclothes, sheets and blankets)?: Unable Difficulty moving from lying on back to sitting on the side of the bed? : A Lot Difficulty sitting down on and standing up from a chair with arms (e.g., wheelchair, bedside commode, etc,.)?: Unable Help needed moving to and from a bed to chair (including a wheelchair)?: A Little Help needed walking in hospital room?: A Lot Help needed climbing 3-5 steps with a railing? : A Lot 6 Click Score: 11    End of Session Equipment Utilized During Treatment: Gait belt Activity Tolerance: Patient limited by pain;Patient limited by fatigue Patient left: in chair;with call bell/phone within reach Nurse Communication: Mobility status PT Visit Diagnosis: Unsteadiness on feet (R26.81);Other abnormalities of gait and mobility (R26.89);Pain Pain - Right/Left: Right Pain - part of body: Arm;Leg     Time: 0820-0850 PT Time Calculation (min) (ACUTE ONLY): 30 min  Charges:  $Therapeutic Activity: 8-22 mins                    G Codes:       Van ClinesHolly Derrik Hoffman, PT  Acute Rehabilitation Services Pager [phone] Office [phone]    Andre Hoffman 12/19/2017, 11:28 AM

## 2017-12-19 NOTE — Progress Notes (Signed)
Occupational Therapy Treatment Patient Details Name: Andre Hoffman MRN: [id number] DOB: Nov 21, 1971 Today's Date: 12/19/2017    History of present illness 46 year old male with a past medical history of hypertension, obesity, osteoarthritis previous left hip replacement in 2017 and right knee replacement in January 2018 presented after sustaining a mechanical fall while at work. This resulted in distal right femur fracture. Now s/p INTRAMEDULLARY (IM) RETROGRADE FEMORAL NAILING (Right) on 12/28.   OT comments  Pt. Seen with PT to focus on increasing safety and independence with bed mobility and transfers in preparation for ADL completion.  Pt. Remains 2 person assist but able to demonstrate improvement with bed mobility and maintaining TDWB status.  Pt. Is motivated for participation and eager to return home when able.   Follow Up Recommendations  Home health OT;Supervision/Assistance - 24 hour    Equipment Recommendations  None recommended by OT    Recommendations for Other Services      Precautions / Restrictions Precautions Precautions: Fall Restrictions RLE Weight Bearing: Touchdown weight bearing Other Position/Activity Restrictions: tx. RUE as NWB per PA until results of xray obtained (order placed today 12/19/17)       Mobility Bed Mobility Overal bed mobility: Needs Assistance Bed Mobility: Supine to Sit     Supine to sit: HOB elevated;Min assist     General bed mobility comments: Assist for RLE. Cues for hand placement and scooting out to EOB. Pt able to manage trunk without physical assist. pt. reports wife is planning on having a hospital bed for home so allowed use of bed rails. will likely exit on R side at home  Transfers Overall transfer level: Needs assistance Equipment used: None Transfers: Sit to/from Stand;Stand Pivot Transfers Sit to Stand: Mod assist;+2 physical assistance Stand pivot transfers: +2 physical assistance;Mod assist             Balance                                           ADL either performed or assessed with clinical judgement   ADL Overall ADL's : Needs assistance/impaired                         Toilet Transfer: Moderate assistance;+2 for physical assistance;Cueing for safety;Cueing for sequencing;Stand-pivot Toilet Transfer Details (indicate cue type and reason): Simulated by transfer EOB to chair         Functional mobility during ADLs: Moderate assistance;+2 for physical assistance General ADL Comments: noted improvement with intial maintianing of TDWB, but some weight placed during pivot to recliner, pt. aware and states its just "really hard not to"     Vision       Perception     Praxis      Cognition Arousal/Alertness: Awake/alert Behavior During Therapy: WFL for tasks assessed/performed Overall Cognitive Status: Within Functional Limits for tasks assessed                                          Exercises     Shoulder Instructions       General Comments      Pertinent Vitals/ Pain       Pain Assessment: 0-10 Pain Score: 8  Pain Location: R leg Pain Descriptors / Indicators: Aching  Pain Intervention(s): Limited activity within patient's tolerance;Monitored during session;Patient requesting pain meds-RN notified;RN gave pain meds during session  Home Living                                          Prior Functioning/Environment              Frequency  Min 2X/week        Progress Toward Goals  OT Goals(current goals can now be found in the care plan section)  Progress towards OT goals: Progressing toward goals     Plan Discharge plan remains appropriate    Co-evaluation    PT/OT/SLP Co-Evaluation/Treatment: Yes Reason for Co-Treatment: For patient/therapist safety;To address functional/ADL transfers   OT goals addressed during session: ADL's and self-care      AM-PAC PT "6 Clicks"  Daily Activity     Outcome Measure   Help from another person eating meals?: None Help from another person taking care of personal grooming?: A Little Help from another person toileting, which includes using toliet, bedpan, or urinal?: A Lot Help from another person bathing (including washing, rinsing, drying)?: A Lot Help from another person to put on and taking off regular upper body clothing?: A Little Help from another person to put on and taking off regular lower body clothing?: A Lot 6 Click Score: 16    End of Session Equipment Utilized During Treatment: Gait belt  OT Visit Diagnosis: Unsteadiness on feet (R26.81);Other abnormalities of gait and mobility (R26.89);History of falling (Z91.81);Muscle weakness (generalized) (M62.81);Pain Pain - Right/Left: Right Pain - part of body: Leg   Activity Tolerance Patient tolerated treatment well   Patient Left in chair;with call bell/phone within reach   Nurse Communication          Time: 0820-0850 OT Time Calculation (min): 30 min  Charges: OT General Charges $OT Visit: 1 Visit OT Treatments $Self Care/Home Management : 8-22 mins   Robet LeuMorris, Zyron Deeley Lorraine, COTA/L 12/19/2017, 9:07 AM

## 2017-12-19 NOTE — Progress Notes (Signed)
[facility] PROGRESS NOTE  Andre RyderJames II Hoffman WUJ:[id number] DOB: 09-Dec-1971 DOA: 12/11/2017  PCP: Georgann HousekeeperHusain, Karrar, MD  Brief History/Interval Summary: 46 year old male with a past medical history of hypertension, obesity, osteoarthritis previous left hip replacement in 2017 and right knee replacement in January 2018 presented after sustaining a mechanical fall while at work.  This resulted in distal right femur fracture.  Subsequently patient developed fever.  No etiology was found initially.  Lower extremity Doppler studies positive for DVT.  Now also experiencing polyarthritis.  Patient with history of gout.  Reason for Visit: Periprosthetic fracture of the distal right femur  Consultants: Orthopedics, hematology  Procedures:   Lower extremity venous Doppler Final Interpretation Right: There is evidence of acute DVT in the Popliteal vein, Posterior Tibial vein, and Peroneal vein. No cystic structure found in the popliteal fossa. Left: There is no evidence of deep vein thrombosis in the lower extremity. No cystic structure found in the popliteal fossa.  prbc transfusion on 12/28 1.  Retrograde intramedullary fixation of right femur. On 12/28 by Dr Linna CapriceSwinteck 2.  Application of incisional wound VAC    Antibiotics: None  Subjective/Interval History: No more fever, tmax 98.8 Post op day two, he is sitting up in chair, denies leg pain, no sob, no chest pain his wrist joints and elbow joints  Pain and stiffness has resolved, he reports felt immediate response after the first dose of steroids. He reports a history of gout in toes but has not had an attack in the last 6135yrs since he cut down on bear and meat intake.    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  Objective:  Vital Signs  Vitals:   12/18/17 1157 12/18/17 1533 12/18/17 2019 12/19/17 0605  BP: (!) 161/87 (!) 161/87 (!) 173/89  (!) 154/76  Pulse: 75 75 79 70  Resp: 18 18 18 18   Temp: 98.8 F (37.1 C) 98.6 F (37 C) 98.2 F (36.8 C) 99.2 F (37.3 C)  TempSrc: Oral Oral Oral Oral  SpO2: 98% 97% 99% 98%  Weight:      Height:        Intake/Output Summary (Last 24 hours) at 12/19/2017 1505 Last data filed at 12/19/2017 0700 Gross per 24 hour  Intake 240 ml  Output 1350 ml  Net -1110 ml   Filed Weights   12/11/17 1010  Weight: (!) 149.7 kg (330 lb)    General appearance: Awake alert.  In no distress. Resp: Lungs are clear to auscultation bilaterally.  No wheezing rales or rhonchi. Cardio: S1-S2 is normal regular.  No S3 or S4.  No rubs murmurs or bruit GI: Abdomen remains soft.  Nontender nondistended  Extremities/musculoskeletal: swelling of both his wrist joints and elbow joints has resolved.  Range of motion has improved.  Right hip post op changes with wound vac and bandage No skin rashes Neurologic: No focal neurological deficits.  Lab Results:  Data Reviewed: I have personally reviewed following labs and imaging studies  CBC: Recent Labs  Lab 12/13/17 2324 12/15/17 0735 12/16/17 0816 12/17/17 0618 12/17/17 0952 12/18/17 0744 12/19/17 0338  WBC 10.3 10.2 11.1* 10.4  --  11.8* 11.3*  NEUTROABS 8.4*  --   --   --   --   --   --   HGB 9.9* 8.9* 9.2* 8.7* 8.5* 8.5* 8.6*  HCT 29.8* 27.5* 26.0* 26.4* 25.0* 25.6* 25.9*  MCV 100.0 100.0 98.9 98.1  --  94.1 95.6  PLT 201 221 283 335  --  325 368    Basic Metabolic Panel: Recent Labs  Lab 12/15/17 0735 12/16/17 0816 12/17/17 0618 12/17/17 0952 12/18/17 0744 12/19/17 0338  NA 138 136 137 138 138 135  K 3.9 3.9 4.2 4.7 4.3 3.6  CL 108 105 105  --  107 102  CO2 23 23 23   --  25 25  GLUCOSE 131* 130* 159* 177* 157* 126*  BUN 14 13 13   --  13 14  CREATININE 1.12 1.09 0.98  --  0.96 0.96  CALCIUM 8.5* 8.5* 8.7*  --  8.6* 8.2*    GFR: Estimated Creatinine Clearance: 148.5 mL/min (by C-G formula based on SCr of 0.96  mg/dL).   Recent Results (from the past 240 hour(s))  MRSA PCR Screening     Status: None   Collection Time: 12/13/17  9:51 AM  Result Value Ref Range Status   MRSA by PCR NEGATIVE NEGATIVE Final    Comment:        The GeneXpert MRSA Assay (FDA approved for NASAL specimens only), is one component of a comprehensive MRSA colonization surveillance program. It is not intended to diagnose MRSA infection nor to guide or monitor treatment for MRSA infections.   Culture, blood (routine x 2)     Status: None   Collection Time: 12/13/17 10:51 PM  Result Value Ref Range Status   Specimen Description BLOOD RIGHT ANTECUBITAL  Final   Special Requests   Final    BOTTLES DRAWN AEROBIC AND ANAEROBIC Blood Culture adequate volume   Culture NO GROWTH 5 DAYS  Final   Report Status 12/19/2017 FINAL  Final  Culture, blood (routine x 2)     Status: None   Collection Time: 12/13/17 11:04 PM  Result Value Ref Range Status   Specimen Description BLOOD RIGHT HAND  Final   Special Requests   Final    BOTTLES DRAWN AEROBIC ONLY Blood Culture adequate volume   Culture NO GROWTH 5 DAYS  Final   Report Status 12/19/2017 FINAL  Final  Culture, Urine     Status: None   Collection Time: 12/14/17  8:50 AM  Result Value Ref Range Status   Specimen Description URINE, CATHETERIZED  Final   Special Requests NONE  Final   Culture NO GROWTH  Final   Report Status 12/15/2017 FINAL  Final  Surgical PCR screen     Status: None   Collection Time: 12/17/17 12:40 AM  Result Value Ref Range Status   MRSA, PCR NEGATIVE NEGATIVE Final   Staphylococcus aureus NEGATIVE NEGATIVE Final    Comment: (NOTE) The Xpert SA Assay (FDA approved for NASAL specimens in patients 26 years of age and older), is one component of a comprehensive surveillance program. It is not intended to diagnose infection nor to guide or monitor treatment.       Radiology Studies: Dg Wrist 2 Views Right  Result Date: 12/19/2017 CLINICAL  DATA:  Right wrist pain following fall 1 week ago, initial encounter EXAM: RIGHT WRIST - 2 VIEW COMPARISON:  None. FINDINGS: There is no evidence of fracture or dislocation. There is no evidence of arthropathy or other focal bone abnormality. Soft tissues are unremarkable. IMPRESSION: No acute abnormality noted. Electronically Signed   By: Alcide Clever M.D.   On: 12/19/2017 10:49     Medications:  Scheduled: . amLODipine  10 mg Oral Daily  . carvedilol  25  mg Oral BID  . enoxaparin (LOVENOX) injection  1 mg/kg Subcutaneous Q12H  . furosemide  20 mg Oral Daily  . hydrALAZINE  25 mg Oral Q8H  . lidocaine  1 patch Transdermal Q24H  . losartan  100 mg Oral Daily  . polyethylene glycol  17 g Oral Daily  . predniSONE  20 mg Oral Q breakfast  . senna-docusate  1 tablet Oral BID   Continuous:  UJW:[id number]:acetaminophen **OR** acetaminophen, diphenhydrAMINE, HYDROcodone-acetaminophen, HYDROmorphone (DILAUDID) injection, menthol-cetylpyridinium **OR** phenol, metoCLOPramide **OR** metoCLOPramide (REGLAN) injection, morphine injection, ondansetron **OR** ondansetron (ZOFRAN) IV, oxyCODONE  Assessment/Plan:  Active Problems:   Other fracture of right femur, initial encounter for closed fracture (HCC)   Stress fracture, right femur, initial encounter for fracture   Closed fracture of right distal femur (HCC)   Fever Fever resolved,  Infectious workup has been negative.  Blood cultures have been negative so far. However he was found to have right lower extremity DVT which could be responsible for the fever.   He also appears to be experiencing an acute gout flare which could be another reason for fever.     Treat with anti-inflammatory drugs for now.  Right lower extremity DVT Found to have distal in the right lower extremity.  Most likely due to his recent injury and immobilization.  Patient started on IV heparin.  Discussed with vascular surgery yesterday (Dr. Imogene Burnhen).  No role for IVC filter.    Hematology consulted, recommend  Start lovenox then transition to xarelto at discharge. Case manager notified need to discharge on xarelto.   Polyarthritis Patient mentions history of gout.  Does not take allopurinol or any other medications for same.  Uric acid level noted to be 6.7.  Motrin ordered for 3 doses. Hesitate using it long-term due to his history of CHF.    Steroids started on 12/27  With good response, symptom has resolved, taper steroids, he need to follow up with pmd, consider rheumatology referral. I have discussed with the patient , he expressed understanding.  Normocytic anemia Patient did have a drop in hemoglobin.  Hemoglobin actually quite similar to what it was back in January.  So his initial value could have been hemoconcentrated.  He could have also had some bleeding in his fracture location.  Otherwise no other overt bleeding has been noted.  Hemoglobin is stable today.  Anemia panel showed ferritin of 680.  B12 486.  Folate 6.3.  Continue to monitor.  S/p prbc transfusion on 12/28 Patient is going to follow with hematology/oncology Dr Pamelia HoitGudena.  Acute comminuted periprosthetic fracture of the distal right femur S/p 1.  Retrograde intramedullary fixation of right femur. 2.  Application of incisional wound VAC. On 12/28 by Dr Linna CapriceSwinteck Wound care/weight bearing per ortho   Mechanical fall at work Patient denies any dizziness or lightheadedness prior to his fall.  He was laughing hard and took a step, was not looking where he was going, and tripped resulting in this fall.  No syncopal episode.  Does not need any further workup at this time.  History of essential hypertension/Chronic Systolic CHF Continue home medications.  Monitor blood pressures closely.  Patient also mentions a history of CHF.  Last echocardiogram is from 2014 with an ejection fraction was noted to be 40-45%.  He has seen by Dr. Eldridge DaceVaranasi with cardiology previously. However does not follow up on a  regular basis.  This apparently is managed by his PCP.  Takes diuretics on a daily basis.  Cardiac status  appears to be stable.  EKG does not show any ischemic changes.  He has had 2 orthopedic procedures within the last one year without any complications.  Seems to be well compensated at this time.  Outpatient monitoring by PCP as before. Resume furosemide postoperatively.  HTN; resume home meds norvasc, coreg, cozaar, hydralazine, lasix  Morbid Obesity Body mass index is 42.37 kg/m.  DVT Prophylaxis: SCDs /lovenox   Code Status: Full code Family Communication: Discussed with patient and wife Disposition Plan: home with home health with ortho clearance, hopefully home on monday      LOS: 8 days   Albertine Grates MD PhD  [facility] Pager [phone] 12/19/2017, 3:05 PM  If 7PM-7AM, please contact night-coverage at [email or web address], password [facility]

## 2017-12-19 NOTE — Plan of Care (Signed)
  Nutrition: Adequate nutrition will be maintained 12/19/2017 1022 - Progressing by Darrow BussingArcilla, Garv Kuechle M, RN   Elimination: Will not experience complications related to bowel motility 12/19/2017 1022 - Progressing by Darrow BussingArcilla, Kypton Eltringham M, RN   Pain Managment: General experience of comfort will improve 12/19/2017 1022 - Progressing by Darrow BussingArcilla, Jasraj Lappe M, RN   Activity: Ability to ambulate and perform ADLs will improve 12/19/2017 1022 - Progressing by Darrow BussingArcilla, Fumi Guadron M, RN

## 2017-12-19 NOTE — Progress Notes (Signed)
Subjective: 2 Days Post-Op Procedure(s) (LRB): INTRAMEDULLARY (IM) RETROGRADE FEMORAL NAILING (Right) Patient reports pain as moderate.  Reports pain distal femur at fx site. Also c/o R wrist pain, stiffness on ulnar side. Has not had wrist xrays. Unsure if he landed on this in his fall injuring the femur.  Objective: Vital signs in last 24 hours: Temp:  [98.2 F (36.8 C)-99.2 F (37.3 C)] 99.2 F (37.3 C) (12/30 0605) Pulse Rate:  [70-79] 70 (12/30 0605) Resp:  [18] 18 (12/30 0605) BP: (154-173)/(76-89) 154/76 (12/30 0605) SpO2:  [97 %-99 %] 98 % (12/30 0605)  Intake/Output from previous day: 12/29 0701 - 12/30 0700 In: 240 [P.O.:240] Out: 1350 [Urine:1350] Intake/Output this shift: No intake/output data recorded.  Recent Labs    12/17/17 0618 12/17/17 0952 12/18/17 0744 12/19/17 0338  HGB 8.7* 8.5* 8.5* 8.6*   Recent Labs    12/18/17 0744 12/19/17 0338  WBC 11.8* 11.3*  RBC 2.72* 2.71*  HCT 25.6* 25.9*  PLT 325 368   Recent Labs    12/18/17 0744 12/19/17 0338  NA 138 135  K 4.3 3.6  CL 107 102  CO2 25 25  BUN 13 14  CREATININE 0.96 0.96  GLUCOSE 157* 126*  CALCIUM 8.6* 8.2*   No results for input(s): LABPT, INR in the last 72 hours.  Neurologically intact ABD soft Neurovascular intact Sensation intact distally Intact pulses distally Dorsiflexion/Plantar flexion intact Incision: dressing C/D/I and no drainage No cellulitis present Compartment soft no calf pain or sign of DVT  R wrist ttp ulnar styloid, hypothenar eminence Mildly decreased flexion/extension R wrist Mild swelling R wrist Radial pulse 2+ sensation intact  Assessment/Plan: 2 Days Post-Op Procedure(s) (LRB): INTRAMEDULLARY (IM) RETROGRADE FEMORAL NAILING (Right) Advance diet Up with therapy D/C IV fluids  R wrist xrays. Remain NWB R wrist for now until obtain xrays, discussed with PT Continue TDWB RLE Lovenox bridging to Xarelto for DVT ppx  BISSELL, JACLYN M. 12/19/2017,  9:04 AM

## 2017-12-20 ENCOUNTER — Encounter (HOSPITAL_COMMUNITY): Payer: Self-pay | Admitting: Orthopedic Surgery

## 2017-12-20 DIAGNOSIS — I82401 Acute embolism and thrombosis of unspecified deep veins of right lower extremity: Secondary | ICD-10-CM

## 2017-12-20 DIAGNOSIS — I5032 Chronic diastolic (congestive) heart failure: Secondary | ICD-10-CM

## 2017-12-20 LAB — TSH: TSH: 1.834 u[IU]/mL (ref 0.350–4.500)

## 2017-12-20 LAB — CBC
HEMATOCRIT: 26.8 % — AB (ref 39.0–52.0)
HEMOGLOBIN: 8.8 g/dL — AB (ref 13.0–17.0)
MCH: 31.5 pg (ref 26.0–34.0)
MCHC: 32.8 g/dL (ref 30.0–36.0)
MCV: 96.1 fL (ref 78.0–100.0)
Platelets: 400 10*3/uL (ref 150–400)
RBC: 2.79 MIL/uL — AB (ref 4.22–5.81)
RDW: 15.6 % — ABNORMAL HIGH (ref 11.5–15.5)
WBC: 11.5 10*3/uL — ABNORMAL HIGH (ref 4.0–10.5)

## 2017-12-20 LAB — BASIC METABOLIC PANEL
Anion gap: 8 (ref 5–15)
BUN: 14 mg/dL (ref 6–20)
CHLORIDE: 104 mmol/L (ref 101–111)
CO2: 26 mmol/L (ref 22–32)
Calcium: 8.4 mg/dL — ABNORMAL LOW (ref 8.9–10.3)
Creatinine, Ser: 1 mg/dL (ref 0.61–1.24)
GFR calc Af Amer: >60 mL/min (ref >60)
GFR calc non Af Amer: >60 mL/min (ref >60)
GLUCOSE: 114 mg/dL — AB (ref 65–99)
POTASSIUM: 3.5 mmol/L (ref 3.5–5.1)
Sodium: 138 mmol/L (ref 135–145)

## 2017-12-20 LAB — MAGNESIUM: Magnesium: 2 mg/dL (ref 1.7–2.4)

## 2017-12-20 MED ORDER — RIVAROXABAN (XARELTO) VTE STARTER PACK (15 & 20 MG): ORAL_TABLET | ORAL | 0 refills | Status: AC

## 2017-12-20 MED ORDER — SENNOSIDES-DOCUSATE SODIUM 8.6-50 MG PO TABS: 1.0000 | ORAL_TABLET | Freq: Every day | ORAL | 0 refills | Status: AC

## 2017-12-20 MED ORDER — RIVAROXABAN 20 MG PO TABS: 20.0000 mg | ORAL_TABLET | Freq: Every day | ORAL | Status: DC

## 2017-12-20 MED ORDER — HYDROCODONE-ACETAMINOPHEN 5-325 MG PO TABS: 1.0000 | ORAL_TABLET | Freq: Four times a day (QID) | ORAL | 0 refills | Status: DC | PRN

## 2017-12-20 MED ORDER — PREDNISONE 10 MG PO TABS: ORAL_TABLET | ORAL | 0 refills | Status: AC

## 2017-12-20 MED ORDER — HYDROCODONE-ACETAMINOPHEN 5-325 MG PO TABS: 1.0000 | ORAL_TABLET | Freq: Four times a day (QID) | ORAL | 0 refills | Status: AC | PRN

## 2017-12-20 MED ORDER — RIVAROXABAN 15 MG PO TABS
15.0000 mg | ORAL_TABLET | Freq: Two times a day (BID) | ORAL | Status: DC
  Administered 2017-12-20 (×2): 15 mg via ORAL
  Filled 2017-12-20 (×3): qty 1

## 2017-12-20 NOTE — Progress Notes (Signed)
Physical Therapy Treatment Patient Details Name: Andre Hoffman MRN: [id number] DOB: 1971-01-26 Today's Date: 12/20/2017    History of Present Illness 46 year old male with a past medical history of hypertension, obesity, osteoarthritis previous left hip replacement in 2017 and right knee replacement in January 2018 presented after sustaining a mechanical fall while at work. This resulted in distal right femur fracture. Now s/p INTRAMEDULLARY (IM) RETROGRADE FEMORAL NAILING (Right) on 12/28.    PT Comments    Continuing work on functional mobility and activity tolerance;  Excellent effort with ambulation, still with difficulty maintaining TWB RLE; initial gait attempts were with the RW, and his R wrist continued to be too painful; Much better tolerance and posture with R platform, and so I am updating my recommendations to include a R platform; He is well-equipped otherwise   Follow Up Recommendations  Home health PT;Supervision/Assistance - 24 hour     Equipment Recommendations  Other (comment)(Right platform RW)    Recommendations for Other Services       Precautions / Restrictions Precautions Precautions: Fall Restrictions RLE Weight Bearing: Touchdown weight bearing Other Position/Activity Restrictions: R wrist x-rays negative for fracture, so proceeded as WBAT    Mobility  Bed Mobility Overal bed mobility: Needs Assistance Bed Mobility: Supine to Sit     Supine to sit: HOB elevated;Min guard     General bed mobility comments: Cues for hand placement and scooting out to EOB. Inefficient movement, but Pt able to manage trunk without physical assist. pt. reports wife is planning on having a hospital bed for home so allowed use of bed rails. will likely exit on R side at home  Transfers Overall transfer level: Needs assistance Equipment used: Rolling walker (2 wheeled);Right platform walker Transfers: Sit to/from Stand Sit to Stand: Mod assist         General  transfer comment: Cues for hand placement; heavier mod assist to stand from bed; light mod assist to [facility] bars  Ambulation/Gait Ambulation/Gait assistance: Min guard Ambulation Distance (Feet): 20 Feet Assistive device: Rolling walker (2 wheeled);Right platform walker Gait Pattern/deviations: Step-to pattern Gait velocity: slow Gait velocity interpretation: Below normal speed for age/gender General Gait Details: Cues for TWB RLE; Noting some difficulty keeping TWB, but excellent effort; Initially used normal RW, however considerable pain R wrist, and [name] tended to prop his forearm; walked bathroom to chair with R platform, and pt tells me this made a diffierence in his ambulation tolerance; much better posture with R platform   Stairs         General stair comments: Chanetta MarshallJimmy tells me his wife has setup for a temporary ramp, and they have a wheelchair  Wheelchair Mobility    Modified Rankin (Stroke Patients Only)       Balance     Sitting balance-Leahy Scale: Fair       Standing balance-Leahy Scale: Poor                              Cognition Arousal/Alertness: Awake/alert Behavior During Therapy: WFL for tasks assessed/performed Overall Cognitive Status: Within Functional Limits for tasks assessed                                        Exercises      General Comments        Pertinent Vitals/Pain  Pain Assessment: Faces Faces Pain Scale: Hurts even more Pain Location: R leg; R wrist pain with weight bearing Pain Descriptors / Indicators: Aching Pain Intervention(s): Monitored during session    Home Living                      Prior Function            PT Goals (current goals can now be found in the care plan section) Acute Rehab PT Goals Patient Stated Goal: get better and return home PT Goal Formulation: With patient Time For Goal Achievement: 12/25/17 Potential to Achieve Goals: Fair Progress  towards PT goals: Progressing toward goals    Frequency    7X/week      PT Plan Current plan remains appropriate    Co-evaluation              AM-PAC PT "6 Clicks" Daily Activity  Outcome Measure  Difficulty turning over in bed (including adjusting bedclothes, sheets and blankets)?: A Lot Difficulty moving from lying on back to sitting on the side of the bed? : A Little Difficulty sitting down on and standing up from a chair with arms (e.g., wheelchair, bedside commode, etc,.)?: Unable Help needed moving to and from a bed to chair (including a wheelchair)?: A Little Help needed walking in hospital room?: A Little Help needed climbing 3-5 steps with a railing? : Total 6 Click Score: 13    End of Session Equipment Utilized During Treatment: Gait belt Activity Tolerance: Patient tolerated treatment well Patient left: in chair;with call bell/phone within reach Nurse Communication: Mobility status PT Visit Diagnosis: Unsteadiness on feet (R26.81);Other abnormalities of gait and mobility (R26.89);Pain Pain - Right/Left: Right Pain - part of body: Arm;Leg     Time: [id number] PT Time Calculation (min) (ACUTE ONLY): 35 min  Charges:  $Gait Training: 8-22 mins $Therapeutic Activity: 8-22 mins                    G Codes:       Van ClinesHolly Elisa Kutner, PT  Acute Rehabilitation Services Pager [phone] Office [phone]    Levi AlandHolly H Annastasia Haskins 12/20/2017, 4:25 PM

## 2017-12-20 NOTE — Progress Notes (Signed)
Removed IV, provided discharge education/instructions, all questions and concerns addressed, Pt not in distress, discharged home with belongings accompanied by wife. 

## 2017-12-20 NOTE — Progress Notes (Signed)
ANTICOAGULATION CONSULT NOTE - Initial Consult  Pharmacy Consult for Xarelto Indication: DVT  Allergies  Allergen Reactions  . Lisinopril Swelling    Angioedema  . Penicillins Swelling    SWELLING REACTION UNSPECIFIED as a child  Has patient had a PCN reaction causing immediate rash, facial/tongue/throat swelling, SOB or lightheadedness with hypotension: Unknown Has patient had a PCN reaction causing severe rash involving mucus membranes or skin necrosis: Unknown Has patient had a PCN reaction that required hospitalization: Unknown Has patient had a PCN reaction occurring within the last 10 years: No If all of the above answers are "NO", then may proceed with Cephalosporin use.      Patient Measurements: Height: 6\' 2"  (188 cm) Weight: (!) 330 lb (149.7 kg) IBW/kg (Calculated) : 82.2 Heparin Dosing Weight:    Vital Signs: Temp: 99.7 F (37.6 C) (12/31 0425) Temp Source: Oral (12/31 0425) BP: 160/74 (12/31 0425) Pulse Rate: 78 (12/31 0425)  Labs: Recent Labs    12/18/17 0744 12/19/17 0338 12/20/17 0642  HGB 8.5* 8.6* 8.8*  HCT 25.6* 25.9* 26.8*  PLT 325 368 400  CREATININE 0.96 0.96 1.00    Estimated Creatinine Clearance: 142.6 mL/min (by C-G formula based on SCr of 1 mg/dL).   Medical History: Past Medical History:  Diagnosis Date  . Arthritis    OSTEO  . CHF (congestive heart failure) (HCC)    Echo- 2014- EF 40-45%; mild LVH.Takes Lasix daily  . History of bronchitis    3 yrs ago  . History of gout    only once   . Hypertension    takes Coreg and Losartan daily  . Joint pain   . White coat hypertension     Assessment: CC/HPI: s/p fall with femur fracture  PMH: CHF, gout, HTN, obesity, OA, hip replacement 2017, R knee replacement 1/18.   Anticoag: Heparin for acute RLE DVT, surgery 12/28 >> Lovenox>12/31 Xarelto. Hgb 8.8 stable. Scr 1 with CrCl>100.  Goal of Therapy:  Therapeutic oral anticoagulation   Plan:  Xarelto 15mg  BID x 21d then  20mg  daily  Jaxson Keener S. Merilynn Finlandobertson, PharmD, BCPS Clinical Staff Pharmacist Pager [phone]  Misty Stanleyobertson, Ricke Kimoto Stillinger 12/20/2017,8:37 AM

## 2017-12-20 NOTE — Plan of Care (Signed)
  Activity: Risk for activity intolerance will decrease 12/20/2017 0942 - Progressing by Darrow BussingArcilla, Zaylin Pistilli M, RN   Nutrition: Adequate nutrition will be maintained 12/20/2017 0942 - Progressing by Darrow BussingArcilla, Evangelene Vora M, RN   Elimination: Will not experience complications related to bowel motility 12/20/2017 0942 - Progressing by Darrow BussingArcilla, Elky Funches M, RN   Pain Managment: General experience of comfort will improve 12/20/2017 0942 - Progressing by Darrow BussingArcilla, Earla Charlie M, RN

## 2017-12-20 NOTE — Discharge Summary (Signed)
Discharge Summary  [name]:[id number] DOB: 1971/10/21  PCP: Georgann Housekeeper, MD  Admit date: 12/11/2017 Discharge date: 12/20/2017  Time spent: <74mins  Recommendations for Outpatient Follow-up:  1. F/u with PMD within a week  for hospital discharge follow up, repeat cbc/bmp at follow up 2. F/u with orthopedics 3. F/u with hematology for DVT and anemia.  Discharge Diagnoses:  Active Hospital Problems   Diagnosis Date Noted  . Closed fracture of right distal femur (HCC) 12/17/2017  . Other fracture of right femur, initial encounter for closed fracture (HCC) 12/11/2017  . Stress fracture, right femur, initial encounter for fracture 12/11/2017    Resolved Hospital Problems  No resolved problems to display.    Discharge Condition: stable  Diet recommendation: heart healthy  Filed Weights   12/11/17 1010  Weight: (!) 149.7 kg (330 lb)    History of present illness:  PCP: Georgann Housekeeper, MD  Outpatient Specialists: orthopedic surgery Patient coming from: Home  Chief Complaint: Fall and right knee pain  HPI: Andre Hoffman is a 46 y.o. 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 Laughing so hard and next thing he knows he found himself on the floor. Denies loss of conciousness, chest pain, or palpitations. States he was in his usual state of health prior to the fall.  ED Course: xray of right knee 12/11/17 revealed comminuted fracture of the distal R femur just above the femoral component of right knee prosthesis. Accelerated HTN most likely due to his pain. IV dilaudid prn and lidocaine patch given. Orthopedic surgery contacted by ED physician and requested patient being transferred to [facility] for  possible surgical intervention.    Hospital Course:  Active Problems:   Other fracture of right femur, initial encounter for closed fracture [facility])   Stress fracture, right femur, initial encounter for fracture   Closed fracture of right distal femur (HCC)    Acute comminuted periprosthetic fracture of the distal right femur from mechanical fall (presenting symptom) S/p 1. Retrograde intramedullary fixation of right femur. 2. Application of incisional wound VAC. On 12/28 by Dr Linna Caprice Wound care/weight bearing per ortho He is cleared to d/c home with home health and outpatient ortho follow up.  Fever -  Infectious workup has been negative.  Blood cultures negative. Ua/cxr no acute findings. - right lower extremity DVT which could be responsible for the fever.   -He also appears to be experiencing an acute gout flare which could be another reason for fever.     -Treat with anti-inflammatory drugs for now. -Fever resolved.  Right lower extremity DVT -Found to have distal in the right lower extremity DVT.  Most likely due to his recent injury and immobilization.  -Patient was started on IV heparin.   No role for IVC filter pe vascular surgery Dr Imogene Burn. .   -Hematology consulted, recommend  Start lovenox then transition to xarelto at discharge. Case manager notified need to discharge on xarelto. -he is to follow up with hematology  Polyarthritis -Patient mentions history of gout.  Does not take allopurinol or any other medications for same.  Uric acid level noted to be 6.7.  Motrin ordered for 3 doses. Hesitate using it long-term due to his history of  CHF.    -Steroids started on 12/27  With good response, symptom has resolved, taper steroids, he need to follow up with pmd, consider rheumatology referral. I have discussed with the patient , he expressed understanding.  Normocytic anemia Patient did have a drop in hemoglobin.  Hemoglobin actually quite similar to what it was back  in January.  So his initial value could have been hemoconcentrated.  He could have also had some bleeding in his fracture location.  Otherwise no other overt bleeding has been noted.  - Anemia panel showed ferritin of 680.  B12 486.  Folate 6.3.    -S/p prbc transfusion on 12/28 -Patient is going to follow with hematology/oncology Dr Pamelia HoitGudena.   History Chronic Systolic CHF - Last echocardiogram is from 2014 with an ejection fraction was noted to be 40-45%.  He has seen by Dr. Eldridge DaceVaranasi with cardiology previously. However does not follow up on a regular basis.  This apparently is managed by his PCP.  Takes diuretics on a daily basis.   -Cardiac status appears to be stable.  EKG does not show any ischemic changes.  He has had 2 orthopedic procedures within the last one year without any complications.  Seems to be well compensated at this time. - Outpatient monitoring by PCP as before. Resume furosemide postoperatively.  HTN; resume home meds norvasc, coreg, cozaar, hydralazine, lasix  Morbid Obesity Body mass index is 42.37 kg/m.   Code Status: Full code Family Communication: Discussed with patient and wife Disposition Plan: home with home health with ortho clearance    Procedures: 1.  Retrograde intramedullary fixation of right femur. 2.  Application of incisional wound VAC. On 12/28 by orthopedics Dr Linna CapriceSwinteck  prbc transfusion x1 on 12/28  Consultations:  Orthopedics  hematology  Discharge Exam: BP (!) 160/74 (BP Location: Right Wrist)   Pulse 78   Temp 99.7 F (37.6 C) (Oral)   Resp 17   Ht 6\' 2"  (1.88 m)   Wt (!) 149.7 kg (330 lb)   SpO2 98%   BMI 42.37 kg/m   General: NAD Cardiovascular: RRR Respiratory: CTABL Extremity: swelling of both his wrist joints and elbow joints has resolved.  Range of motion has improved.  Right hip post op changes with wound vac and bandage   Discharge Instructions You were cared for by a hospitalist during your hospital  stay. If you have any questions about your discharge medications or the care you received while you were in the hospital after you are discharged, you can call the unit and asked to speak with the hospitalist on call if the hospitalist that took care of you is not available. Once you are discharged, your primary care physician will handle any further medical issues. Please note that NO REFILLS for any discharge medications will be authorized once you are discharged, as it is imperative that you return to your primary care physician (or establish a relationship with a primary care physician if you do not have one) for your aftercare needs so that they can reassess your need for medications and monitor your lab values.  Discharge Instructions    Diet - low sodium heart healthy   Complete by:  As directed    Increase activity slowly   Complete by:  As directed      Allergies as of 12/20/2017      Reactions   Lisinopril Swelling   Angioedema   Penicillins Swelling   SWELLING REACTION UNSPECIFIED as a child Has patient had  a PCN reaction causing immediate rash, facial/tongue/throat swelling, SOB or lightheadedness with hypotension: Unknown Has patient had a PCN reaction causing severe rash involving mucus membranes or skin necrosis: Unknown Has patient had a PCN reaction that required hospitalization: Unknown Has patient had a PCN reaction occurring within the last 10 years: No If all of the above answers are "NO", then may proceed with Cephalosporin use.      Medication List    STOP taking these medications   aspirin 81 MG chewable tablet   methocarbamol 500 MG tablet Commonly known as:  ROBAXIN   ondansetron 4 MG tablet Commonly known as:  ZOFRAN   oxyCODONE 10 mg 12 hr tablet Commonly known as:  OXYCONTIN   senna 8.6 MG Tabs tablet Commonly known as:  SENOKOT     TAKE these medications   amLODipine 10 MG tablet Commonly known as:  NORVASC Take 10 mg by mouth daily.     carvedilol 25 MG tablet Commonly known as:  COREG Take 25 mg by mouth 2 (two) times daily.   docusate sodium 100 MG capsule Commonly known as:  COLACE Take 1 capsule (100 mg total) by mouth 2 (two) times daily.   furosemide 40 MG tablet Commonly known as:  LASIX Take 20 mg by mouth daily.   hydrALAZINE 25 MG tablet Commonly known as:  APRESOLINE Take 25 mg by mouth 2 (two) times daily with a meal.   HYDROcodone-acetaminophen 5-325 MG tablet Commonly known as:  NORCO/VICODIN Take 1-2 tablets by mouth every 6 (six) hours as needed (knee pain). What changed:    when to take this  reasons to take this   ibuprofen 200 MG tablet Commonly known as:  ADVIL,MOTRIN Take 600-800 mg by mouth every 6 (six) hours as needed for mild pain.   losartan 100 MG tablet Commonly known as:  COZAAR Take 100 mg by mouth daily.   potassium chloride 10 MEQ tablet Commonly known as:  K-DUR,KLOR-CON Take 10 mEq by mouth daily.   predniSONE 10 MG tablet Commonly known as:  DELTASONE Take 20mg  on day one, then 10mg  on day two, then stop.   Rivaroxaban 15 & 20 MG Tbpk Take as directed on package: Start with one 15mg  tablet by mouth twice a day with food. On Day 22, switch to one 20mg  tablet once a day with food.   senna-docusate 8.6-50 MG tablet Commonly known as:  Senokot-S Take 1 tablet by mouth at bedtime.      Allergies  Allergen Reactions  . Lisinopril Swelling    Angioedema  . Penicillins Swelling    SWELLING REACTION UNSPECIFIED as a child  Has patient had a PCN reaction causing immediate rash, facial/tongue/throat swelling, SOB or lightheadedness with hypotension: Unknown Has patient had a PCN reaction causing severe rash involving mucus membranes or skin necrosis: Unknown Has patient had a PCN reaction that required hospitalization: Unknown Has patient had a PCN reaction occurring within the last 10 years: No If all of the above answers are "NO", then may proceed with  Cephalosporin use.     Follow-up Information    Swinteck, Arlys John, MD. Schedule an appointment as soon as possible for a visit on 12/27/2017.   Specialty:  Orthopedic Surgery Why:  for wound VAC removal Contact information: [address] [place] [address] [phone]        Serena Croissant, MD Follow up in 3 month(s).   Specialty:  Hematology and Oncology Why:  DVT and anemia Contact information:  [address] [place] [address] [phone]        Georgann Housekeeper, MD Follow up in 1 week(s).   Specialty:  Internal Medicine Why:  hospital discharge follow up, repeat cbc/bmp at follow up. PMD to refer you to rheumatology if wrist joints symptom reoccur.  Contact information: [address] [place] [address] [phone]        [facility] Follow up.   Specialty:  Home Health Services Why:  A representative from [facility] will contact you to arrange start date and time for your therapy. Contact information: [address] [place] [address] [phone]            The results of significant diagnostics from this hospitalization (including imaging, microbiology, ancillary and laboratory) are listed below for reference.    Significant Diagnostic Studies: Dg Wrist 2 Views Right  Result Date: 12/19/2017 CLINICAL DATA:  Right wrist pain following fall 1 week ago, initial encounter EXAM: RIGHT WRIST - 2 VIEW COMPARISON:  None. FINDINGS: There is no evidence of fracture or dislocation. There is no evidence of arthropathy or other focal bone abnormality. Soft tissues are unremarkable. IMPRESSION: No acute abnormality noted. Electronically Signed   By: Alcide Clever M.D.   On: 12/19/2017 10:49   Ct Knee Right Wo Contrast  Result Date: 12/11/2017 CLINICAL DATA:  46 year old male with right knee periprosthetic fracture EXAM: CT OF THE RIGHT KNEE WITHOUT CONTRAST TECHNIQUE:  Multidetector CT imaging of the RIGHT knee was performed according to the standard protocol. Multiplanar CT image reconstructions were also generated. COMPARISON:  Concurrently obtained radiographs 12/11/2017 FINDINGS: Comminuted and posteromedially displaced and tilted fracture through the distal femur just above the femoral condyles in the anterior aspect of the femoral components of the knee prosthesis. The distal fracture fragment is displaced by approximately 2.6 cm. The patella and tibia remain aligned with the femoral condyles. Moderate associated hemarthrosis and extensive surrounding soft tissue swelling. Streak artifact from the arthroplasty prosthesis slightly limits evaluation of fine osseous detail. No definite evidence of periprosthetic fracture along the tibial component. The visualized fibula is within normal limits. Atherosclerotic calcifications are present in the runoff arteries. IMPRESSION: 1. Comminuted and posteromedially displaced distal femoral periprosthetic fracture. Maximal displacement measures approximately 2.6 cm. 2. The patella and tibia remain aligned with the femoral condyles. 3. Moderate associated hemarthrosis and surrounding soft tissue swelling. 4. Atherosclerotic vascular calcifications. Electronically Signed   By: Malachy Moan M.D.   On: 12/11/2017 14:47   Dg Chest Port 1 View  Result Date: 12/13/2017 CLINICAL DATA:  Fever EXAM: PORTABLE CHEST 1 VIEW COMPARISON:  None. FINDINGS: Low lung volumes. No consolidation or effusion. Borderline cardiomegaly. No pneumothorax. Advanced arthritis at the right shoulder. IMPRESSION: Low lung volumes with borderline cardiomegaly. No focal pulmonary opacity is visualized. Electronically Signed   By: Jasmine Pang M.D.   On: 12/13/2017 21:32   Dg Knee Complete 4 Views Right  Result Date: 12/11/2017 CLINICAL DATA:  Felt a right knee with generalized right knee pain. Knee replacement performed on 01/18/2017. EXAM: RIGHT KNEE -  COMPLETE 4+ VIEW COMPARISON:  01/18/2017 FINDINGS: There is a comminuted fracture of the distal femoral metaphysis just above the femoral component of the right knee prosthesis. Primary fracture is oblique transverse in orientation. The distal fracture component has displaced posteriorly by 2 cm. No other fractures. The knee prosthetic components remain well-seated and aligned. There is a joint effusion distending the suprapatellar joint capsule. Surrounding  soft tissue swelling is noted. IMPRESSION: 1. Comminuted fracture of the distal right femur just above the femoral component of the right knee prosthesis. Distal fracture component is displaced posteriorly by 2 cm. 2. No other fractures.  No dislocation. Electronically Signed   By: Amie Portlandavid  Ormond M.D.   On: 12/11/2017 11:29   Dg C-arm 61-120 Min  Result Date: 12/17/2017 CLINICAL DATA:  Status post IM nail of right femur. EXAM: RIGHT FEMUR 2 VIEWS; DG C-ARM 61-120 MIN COMPARISON:  12/11/2017 FINDINGS: Postoperative changes from ORIF of distal femur fracture identified. Interval placement of IM nail and screw device identified. The fracture fragments and hardware components appear to be in anatomic alignment. IMPRESSION: 1. Status post ORIF of distal femur fracture. Electronically Signed   By: Signa Kellaylor  Stroud M.D.   On: 12/17/2017 10:01   Dg Femur, Min 2 Views Right  Result Date: 12/17/2017 CLINICAL DATA:  Status post IM nail of right femur. EXAM: RIGHT FEMUR 2 VIEWS; DG C-ARM 61-120 MIN COMPARISON:  12/11/2017 FINDINGS: Postoperative changes from ORIF of distal femur fracture identified. Interval placement of IM nail and screw device identified. The fracture fragments and hardware components appear to be in anatomic alignment. IMPRESSION: 1. Status post ORIF of distal femur fracture. Electronically Signed   By: Signa Kellaylor  Stroud M.D.   On: 12/17/2017 10:01   Dg Femur Min 2 Views Right  Result Date: 12/11/2017 CLINICAL DATA:  The patient suffered a  periprosthetic distal right femur fracture in a fall today. Initial encounter. EXAM: RIGHT FEMUR 2 VIEWS COMPARISON:  Plain films right knee this same day. FINDINGS: Periprosthetic femur fracture is again identified. No other acute bony or joint abnormality is seen. Mild right hip osteoarthritis noted. IMPRESSION: Periprosthetic distal right femur fracture as seen on the comparison examination. No other acute abnormality. Electronically Signed   By: Drusilla Kannerhomas  Dalessio M.D.   On: 12/11/2017 14:47   Dg Femur Port, Min 2 Views Right  Result Date: 12/17/2017 CLINICAL DATA:  [age] year old male status post ORIF of distal femur fracture. EXAM: RIGHT FEMUR PORTABLE 2 VIEW COMPARISON:  Intraoperative images 0 942 hours today. Preoperative femur series [id number]. FINDINGS: Portable AP and cross-table lateral views of the right femur. Preexisting right total knee arthroplasty. Severely comminuted distal right femur metadiaphysis fracture now traversed by an intramedullary rod. Single proximal interlocking cortical screw at the level of the proximal shaft. Multiple distal interlocking cortical screws. Improved fracture and butterfly fragment alignment compare to 12/11/2017. Right knee joint postoperative intra-articular gas and fluid. The right knee arthroplasty hardware appears stable. Patella appears intact. Right hip joint appears stable and intact. Calcified femoral artery atherosclerosis. IMPRESSION: 1. ORIF of the right femur with improved alignment about the severely comminuted distal metadiaphysis fracture. 2. Preexisting right total knee arthroplasty. Postoperative air in fluid in the right knee joint. Electronically Signed   By: Odessa FlemingH  Hall M.D.   On: 12/17/2017 11:41    Microbiology: Recent Results (from the past 240 hour(s))  MRSA PCR Screening     Status: None   Collection Time: 12/13/17  9:51 AM  Result Value Ref Range Status   MRSA by PCR NEGATIVE NEGATIVE Final    Comment:        The GeneXpert MRSA Assay  (FDA approved for NASAL specimens only), is one component of a comprehensive MRSA colonization surveillance program. It is not intended to diagnose MRSA infection nor to guide or monitor treatment for MRSA infections.   Culture, blood (routine x 2)  Status: None   Collection Time: 12/13/17 10:51 PM  Result Value Ref Range Status   Specimen Description BLOOD RIGHT ANTECUBITAL  Final   Special Requests   Final    BOTTLES DRAWN AEROBIC AND ANAEROBIC Blood Culture adequate volume   Culture NO GROWTH 5 DAYS  Final   Report Status 12/19/2017 FINAL  Final  Culture, blood (routine x 2)     Status: None   Collection Time: 12/13/17 11:04 PM  Result Value Ref Range Status   Specimen Description BLOOD RIGHT HAND  Final   Special Requests   Final    BOTTLES DRAWN AEROBIC ONLY Blood Culture adequate volume   Culture NO GROWTH 5 DAYS  Final   Report Status 12/19/2017 FINAL  Final  Culture, Urine     Status: None   Collection Time: 12/14/17  8:50 AM  Result Value Ref Range Status   Specimen Description URINE, CATHETERIZED  Final   Special Requests NONE  Final   Culture NO GROWTH  Final   Report Status 12/15/2017 FINAL  Final  Surgical PCR screen     Status: None   Collection Time: 12/17/17 12:40 AM  Result Value Ref Range Status   MRSA, PCR NEGATIVE NEGATIVE Final   Staphylococcus aureus NEGATIVE NEGATIVE Final    Comment: (NOTE) The Xpert SA Assay (FDA approved for NASAL specimens in patients 43 years of age and older), is one component of a comprehensive surveillance program. It is not intended to diagnose infection nor to guide or monitor treatment.      Labs: Basic Metabolic Panel: Recent Labs  Lab 12/16/17 0816 12/17/17 0618 12/17/17 0952 12/18/17 0744 12/19/17 0338 12/20/17 0642  NA 136 137 138 138 135 138  K 3.9 4.2 4.7 4.3 3.6 3.5  CL 105 105  --  107 102 104  CO2 23 23  --  25 25 26   GLUCOSE 130* 159* 177* 157* 126* 114*  BUN 13 13  --  13 14 14   CREATININE  1.09 0.98  --  0.96 0.96 1.00  CALCIUM 8.5* 8.7*  --  8.6* 8.2* 8.4*  MG  --   --   --   --   --  2.0   Liver Function Tests: Recent Labs  Lab 12/13/17 2324 12/15/17 0735 12/16/17 0816  AST 25 22 20   ALT 17 21 19   ALKPHOS 73 75 77  BILITOT 1.4* 1.1 1.0  PROT 6.7 6.4* 6.6  ALBUMIN 3.2* 2.7* 2.7*   No results for input(s): LIPASE, AMYLASE in the last 168 hours. No results for input(s): AMMONIA in the last 168 hours. CBC: Recent Labs  Lab 12/13/17 2324  12/16/17 0816 12/17/17 0618 12/17/17 0952 12/18/17 0744 12/19/17 0338 12/20/17 0642  WBC 10.3   < > 11.1* 10.4  --  11.8* 11.3* 11.5*  NEUTROABS 8.4*  --   --   --   --   --   --   --   HGB 9.9*   < > 9.2* 8.7* 8.5* 8.5* 8.6* 8.8*  HCT 29.8*   < > 26.0* 26.4* 25.0* 25.6* 25.9* 26.8*  MCV 100.0   < > 98.9 98.1  --  94.1 95.6 96.1  PLT 201   < > 283 335  --  325 368 400   < > = values in this interval not displayed.   Cardiac Enzymes: No results for input(s): CKTOTAL, CKMB, CKMBINDEX, TROPONINI in the last 168 hours. BNP: BNP (last 3 results) No results for input(s):  BNP in the last 8760 hours.  ProBNP (last 3 results) No results for input(s): PROBNP in the last 8760 hours.  CBG: Recent Labs  Lab 12/17/17 1049  GLUCAP 165*       Signed:  Albertine Grates MD, PhD  [facility] 12/20/2017, 1:44 PM

## 2017-12-20 NOTE — Discharge Instructions (Signed)
Touchdown weightbearing right lower extremity with walker. Keep wound VAC clean and dry.  Do not remove. Charge wound VAC unit every night. Call [phone] as soon as possible to schedule an appointment exactly 7 days from discharge.     Information on my medicine - XARELTO (rivaroxaban)  WHY WAS XARELTO PRESCRIBED FOR YOU? Xarelto was prescribed to treat blood clots that may have been found in the veins of your legs (deep vein thrombosis) or in your lungs (pulmonary embolism) and to reduce the risk of them occurring again.  What do you need to know about Xarelto? The starting dose is one 15 mg tablet taken TWICE daily with food for the FIRST 21 DAYS then on (enter date)  01/10/2018  the dose is changed to one 20 mg tablet taken ONCE A DAY with your evening meal.  DO NOT stop taking Xarelto without talking to the health care provider who prescribed the medication.  Refill your prescription for 20 mg tablets before you run out.  After discharge, you should have regular check-up appointments with your healthcare provider that is prescribing your Xarelto.  In the future your dose may need to be changed if your kidney function changes by a significant amount.  What do you do if you miss a dose? If you are taking Xarelto TWICE DAILY and you miss a dose, take it as soon as you remember. You may take two 15 mg tablets (total 30 mg) at the same time then resume your regularly scheduled 15 mg twice daily the next day.  If you are taking Xarelto ONCE DAILY and you miss a dose, take it as soon as you remember on the same day then continue your regularly scheduled once daily regimen the next day. Do not take two doses of Xarelto at the same time.   Important Safety Information Xarelto is a blood thinner medicine that can cause bleeding. You should call your healthcare provider right away if you experience any of the following: ? Bleeding from an injury or your nose that does not  stop. ? Unusual colored urine (red or dark brown) or unusual colored stools (red or black). ? Unusual bruising for unknown reasons. ? A serious fall or if you hit your head (even if there is no bleeding).  Some medicines may interact with Xarelto and might increase your risk of bleeding while on Xarelto. To help avoid this, consult your healthcare provider or pharmacist prior to using any new prescription or non-prescription medications, including herbals, vitamins, non-steroidal anti-inflammatory drugs (NSAIDs) and supplements.  This website has more information on Xarelto: [email or web address].xarelto.[email or web address].

## 2017-12-20 NOTE — Progress Notes (Signed)
   Subjective:  Patient reports pain as moderate to severe.  No c/o.  Objective:   VITALS:   Vitals:   12/19/17 0605 12/19/17 1528 12/19/17 2030 12/20/17 0425  BP: (!) 154/76 (!) 168/81 (!) 160/88 (!) 160/74  Pulse: 70 76 88 78  Resp: 18 18 19 17   Temp: 99.2 F (37.3 C) 99.6 F (37.6 C) 99.2 F (37.3 C) 99.7 F (37.6 C)  TempSrc: Oral Oral Oral Oral  SpO2: 98% 100% 100% 98%  Weight:      Height:       NAD ABD soft Intact pulses distally Dorsiflexion/Plantar flexion intact Incision: dressing C/D/I Compartment soft VAC intact  Lab Results  Component Value Date   WBC 11.5 (H) 12/20/2017   HGB 8.8 (L) 12/20/2017   HCT 26.8 (L) 12/20/2017   MCV 96.1 12/20/2017   PLT 400 12/20/2017   BMET    Component Value Date/Time   NA 138 12/20/2017 0642   K 3.5 12/20/2017 0642   CL 104 12/20/2017 0642   CO2 26 12/20/2017 0642   GLUCOSE 114 (H) 12/20/2017 0642   BUN 14 12/20/2017 0642   CREATININE 1.00 12/20/2017 0642   CALCIUM 8.4 (L) 12/20/2017 0642   GFRNONAA >60 12/20/2017 0642   GFRAA >60 12/20/2017 [id number]     Assessment/Plan: 3 Days Post-Op   Active Problems:   Other fracture of right femur, initial encounter for closed fracture (HCC)   Stress fracture, right femur, initial encounter for fracture   Closed fracture of right distal femur (HCC)   TDWB RLE with walker Xarelto, SCDs, TEDs PO pain control D/C home   Andre Hoffman 12/20/2017, 9:22 AM   Andre FredericBrian Jawara Latorre, MD Cell [phone]

## 2017-12-21 LAB — TYPE AND SCREEN
ABO/RH(D): O POS
ANTIBODY SCREEN: NEGATIVE
Unit division: 0
Unit division: 0

## 2017-12-21 LAB — BPAM RBC
BLOOD PRODUCT EXPIRATION DATE: 201901182359
Blood Product Expiration Date: 201901242359
ISSUE DATE / TIME: 201812280916
ISSUE DATE / TIME: 201812280916
UNIT TYPE AND RH: 5100
Unit Type and Rh: 5100

## 2017-12-22 DIAGNOSIS — I110 Hypertensive heart disease with heart failure: Secondary | ICD-10-CM | POA: Diagnosis not present

## 2017-12-22 DIAGNOSIS — M9711XD Periprosthetic fracture around internal prosthetic right knee joint, subsequent encounter: Secondary | ICD-10-CM | POA: Diagnosis not present

## 2017-12-22 DIAGNOSIS — I82491 Acute embolism and thrombosis of other specified deep vein of right lower extremity: Secondary | ICD-10-CM | POA: Diagnosis not present

## 2017-12-23 ENCOUNTER — Telehealth: Payer: Self-pay | Admitting: Hematology and Oncology

## 2017-12-23 DIAGNOSIS — R2681 Unsteadiness on feet: Secondary | ICD-10-CM | POA: Diagnosis not present

## 2017-12-23 NOTE — Telephone Encounter (Signed)
Scheduled appt per 12/31 sch message - Patient Is aware of appt date and time - reminder letter sent in the mail.

## 2018-01-03 DIAGNOSIS — I10 Essential (primary) hypertension: Secondary | ICD-10-CM | POA: Diagnosis not present

## 2018-01-03 DIAGNOSIS — S7290XA Unspecified fracture of unspecified femur, initial encounter for closed fracture: Secondary | ICD-10-CM | POA: Diagnosis not present

## 2018-01-03 DIAGNOSIS — I82401 Acute embolism and thrombosis of unspecified deep veins of right lower extremity: Secondary | ICD-10-CM | POA: Diagnosis not present

## 2018-01-04 DIAGNOSIS — M9711XD Periprosthetic fracture around internal prosthetic right knee joint, subsequent encounter: Secondary | ICD-10-CM | POA: Diagnosis not present

## 2018-01-04 DIAGNOSIS — M25531 Pain in right wrist: Secondary | ICD-10-CM | POA: Diagnosis not present

## 2018-01-10 DIAGNOSIS — G540 Brachial plexus disorders: Secondary | ICD-10-CM | POA: Diagnosis not present

## 2018-01-19 DIAGNOSIS — M79641 Pain in right hand: Secondary | ICD-10-CM | POA: Diagnosis not present

## 2018-01-19 DIAGNOSIS — G540 Brachial plexus disorders: Secondary | ICD-10-CM | POA: Diagnosis not present

## 2018-01-20 DIAGNOSIS — G540 Brachial plexus disorders: Secondary | ICD-10-CM | POA: Diagnosis not present

## 2018-01-31 DIAGNOSIS — Z471 Aftercare following joint replacement surgery: Secondary | ICD-10-CM | POA: Diagnosis not present

## 2018-02-25 DIAGNOSIS — G540 Brachial plexus disorders: Secondary | ICD-10-CM | POA: Diagnosis not present

## 2018-03-01 DIAGNOSIS — M9711XD Periprosthetic fracture around internal prosthetic right knee joint, subsequent encounter: Secondary | ICD-10-CM | POA: Diagnosis not present

## 2018-03-03 DIAGNOSIS — M979XXD Periprosthetic fracture around unspecified internal prosthetic joint, subsequent encounter: Secondary | ICD-10-CM | POA: Diagnosis not present

## 2018-03-23 ENCOUNTER — Ambulatory Visit: Payer: Self-pay | Admitting: Hematology and Oncology

## 2018-04-05 ENCOUNTER — Ambulatory Visit: Payer: Self-pay | Admitting: Hematology and Oncology

## 2018-04-05 DIAGNOSIS — I82401 Acute embolism and thrombosis of unspecified deep veins of right lower extremity: Secondary | ICD-10-CM | POA: Insufficient documentation

## 2018-04-05 NOTE — Progress Notes (Deleted)
Patient Care Team: Georgann HousekeeperHusain, Karrar, MD as PCP - General (Internal Medicine)  DIAGNOSIS:  Encounter Diagnosis  Name Primary?  . 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  DVT is being treated with Xarelto.  He has been on it for the past 3 months and appears to be tolerating it very well.  Denies any bleeding or bruising symptoms.  REVIEW OF SYSTEMS:   Constitutional: Denies fevers, chills or abnormal weight loss Eyes: Denies blurriness of vision Ears, nose, mouth, throat, and face: Denies mucositis or sore throat Respiratory: Denies cough, dyspnea or wheezes Cardiovascular: Denies palpitation, chest discomfort Gastrointestinal:  Denies nausea, heartburn or change in bowel habits Skin: Denies abnormal skin rashes Lymphatics: Denies new lymphadenopathy or easy bruising Neurological:Denies numbness, tingling or new weaknesses Behavioral/Psych: Mood is stable, no new changes  Extremities: No lower extremity edema  All other systems were reviewed with the patient and are negative.  I have reviewed the past medical history, past surgical history, social history and family history with the patient and they are unchanged from previous note.  ALLERGIES:  is allergic to lisinopril and penicillins.  MEDICATIONS:  Current Outpatient Medications  Medication Sig Dispense Refill  . amLODipine (NORVASC) 10 MG tablet Take 10 mg by mouth daily.  11  . carvedilol (COREG) 25 MG tablet Take 25 mg by mouth 2 (two) times daily.  11  . docusate sodium (COLACE) 100 MG capsule Take 1 capsule (100 mg total) by mouth 2 (two) times daily. (Patient not taking: Reported on 12/11/2017) 10 capsule 0  . furosemide (LASIX) 40 MG tablet Take 20 mg  by mouth daily.     . hydrALAZINE (APRESOLINE) 25 MG tablet Take 25 mg by mouth 2 (two) times daily with a meal.  11  . HYDROcodone-acetaminophen (NORCO/VICODIN) 5-325 MG tablet Take 1-2 tablets by mouth every 6 (six) hours as needed (knee pain). 60 tablet 0  . ibuprofen (ADVIL,MOTRIN) 200 MG tablet Take 600-800 mg by mouth every 6 (six) hours as needed for mild pain.    Marland Kitchen. losartan (COZAAR) 100 MG tablet Take 100 mg by mouth daily.    . potassium chloride (K-DUR,KLOR-CON) 10 MEQ tablet Take 10 mEq by mouth daily.    . predniSONE (DELTASONE) 10 MG tablet Take 20mg  on day one, then 10mg  on day two, then stop. 3 tablet 0  . Rivaroxaban 15 & 20 MG TBPK Take as directed on package: Start with one 15mg  tablet by mouth twice a day with food. On Day 22, switch to one 20mg  tablet once a day with food. 51 each 0  . senna-docusate (SENOKOT-S) 8.6-50 MG tablet Take 1 tablet by mouth at bedtime. 30 tablet 0   No current facility-administered medications for this visit.     PHYSICAL EXAMINATION: ECOG PERFORMANCE STATUS: 1 - Symptomatic but completely ambulatory  There were no vitals filed for this visit. There were no vitals filed for this visit.  GENERAL:alert, no distress and comfortable SKIN: skin color, texture, turgor are normal, no rashes or significant lesions EYES: normal, Conjunctiva are pink and non-injected, sclera clear OROPHARYNX:no exudate, no erythema and lips, buccal mucosa, and tongue normal  NECK: supple, thyroid normal size, non-tender, without nodularity LYMPH:  no palpable lymphadenopathy in the cervical, axillary  or inguinal LUNGS: clear to auscultation and percussion with normal breathing effort HEART: regular rate & rhythm and no murmurs and no lower extremity edema ABDOMEN:abdomen soft, non-tender and normal bowel sounds MUSCULOSKELETAL:no cyanosis of digits and no clubbing  NEURO: alert & oriented x 3 with fluent speech, no focal motor/sensory deficits EXTREMITIES: No lower  extremity edema  LABORATORY DATA:  I have reviewed the data as listed CMP Latest Ref Rng & Units 12/20/2017 12/19/2017 12/18/2017  Glucose 65 - 99 mg/dL 272(Z) 366(Y) 403(K)  BUN 6 - 20 mg/dL 14 14 13   Creatinine 0.61 - 1.24 mg/dL 7.42 5.95 6.38  Sodium 135 - 145 mmol/L 138 135 138  Potassium 3.5 - 5.1 mmol/L 3.5 3.6 4.3  Chloride 101 - 111 mmol/L 104 102 107  CO2 22 - 32 mmol/L 26 25 25   Calcium 8.9 - 10.3 mg/dL 7.5(I) 4.3(P) 2.9(J)  Total Protein 6.5 - 8.1 g/dL - - -  Total Bilirubin 0.3 - 1.2 mg/dL - - -  Alkaline Phos 38 - 126 U/L - - -  AST 15 - 41 U/L - - -  ALT 17 - 63 U/L - - -    Lab Results  Component Value Date   WBC 11.5 (H) 12/20/2017   HGB 8.8 (L) 12/20/2017   HCT 26.8 (L) 12/20/2017   MCV 96.1 12/20/2017   PLT 400 12/20/2017   NEUTROABS 8.4 (H) 12/13/2017    ASSESSMENT & PLAN:  Right leg DVT (HCC) Right leg DVT involving popliteal vein, posterior tibial vein, peroneal veins: Related to fracture of right femur December 2018 status post open reduction internal fixation.  Current treatment: Xarelto. Duration of treatment: 6 months of anticoagulation.  Patient will complete 6 months of therapy and will stop Xarelto. Return to clinic on an as-needed basis.    No orders of the defined types were placed in this encounter.  The patient has a good understanding of the overall plan. he agrees with it. he will call with any problems that may develop before the next visit here.   Tamsen Meek, MD 04/05/18

## 2018-04-05 NOTE — Assessment & Plan Note (Deleted)
Right leg DVT involving popliteal vein, posterior tibial vein, peroneal veins: Related to fracture of right femur December 2018 status post open reduction internal fixation.  Current treatment: Xarelto. Duration of treatment: 6 months of anticoagulation.  Patient will complete 6 months of therapy and will stop Xarelto. Return to clinic on an as-needed basis.

## 2018-04-26 DIAGNOSIS — M9711XD Periprosthetic fracture around internal prosthetic right knee joint, subsequent encounter: Secondary | ICD-10-CM | POA: Diagnosis not present

## 2018-11-08 DIAGNOSIS — M9711XD Periprosthetic fracture around internal prosthetic right knee joint, subsequent encounter: Secondary | ICD-10-CM | POA: Diagnosis not present

## 2019-04-04 IMAGING — CT CT KNEE*R* W/O CM
3 of 7 series · 14 of 33 positions shown, 17 images · non-contrast
Comparison: Concurrently obtained radiographs 12/11/2017

CLINICAL DATA: 46-year-old male with right knee periprosthetic
fracture

EXAM:
CT OF THE RIGHT KNEE WITHOUT CONTRAST
TECHNIQUE: Multidetector CT imaging of the RIGHT knee was performed according
to the standard protocol. Multiplanar CT image reconstructions were
also generated.

[Series 3: axial st · axial · 0.40mm/px · z∈[-250,-52]mm · 9 of 166 slices shown, 12 images]
[im 17/166  soft-tissue]
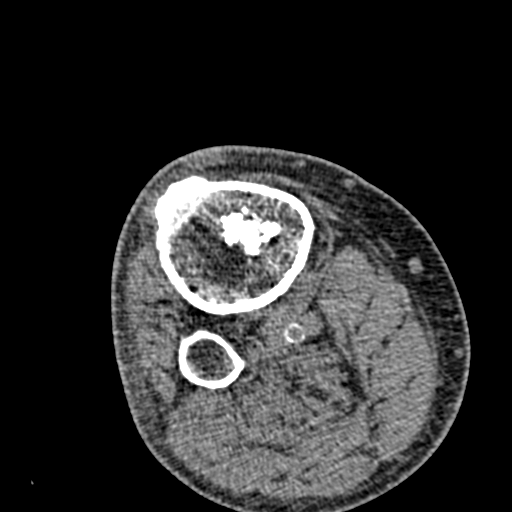
[im 17/166  bone]
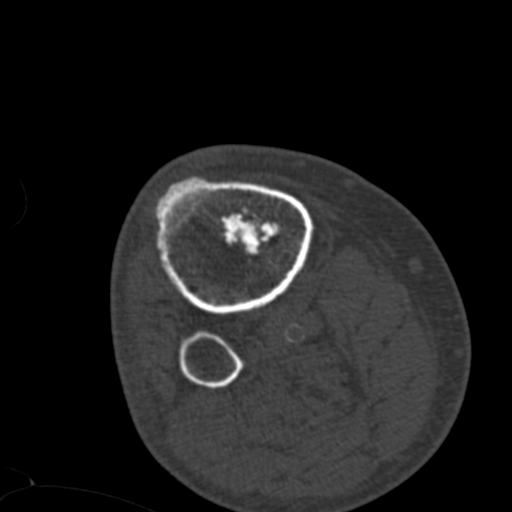
[im 34/166  bone]
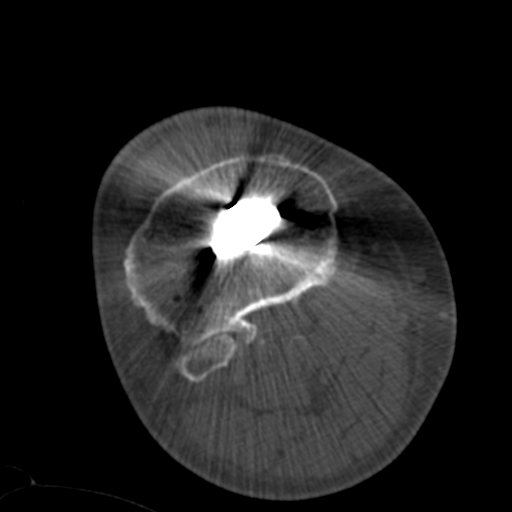
[im 50/166  bone]
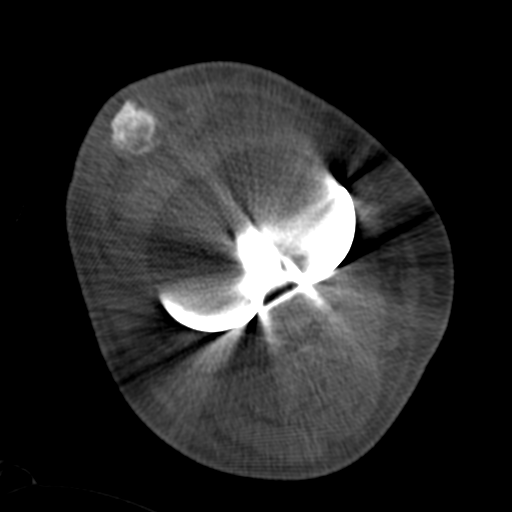
[im 67/166  bone]
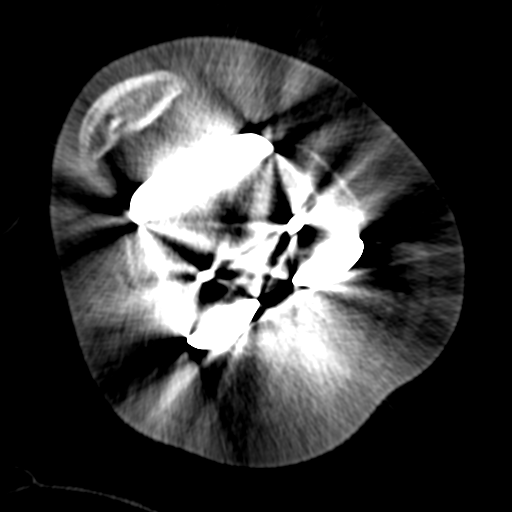
[im 83/166  soft-tissue]
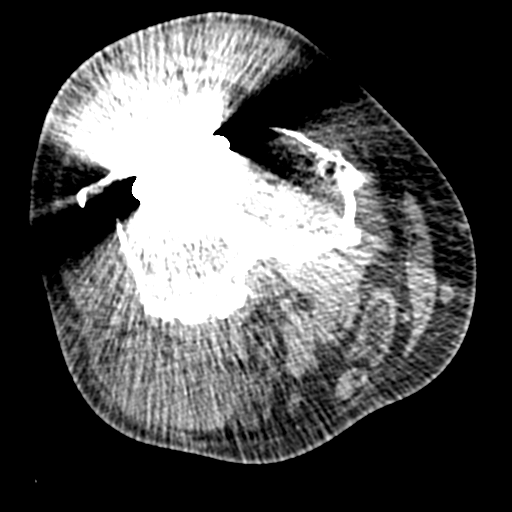
[im 83/166  bone]
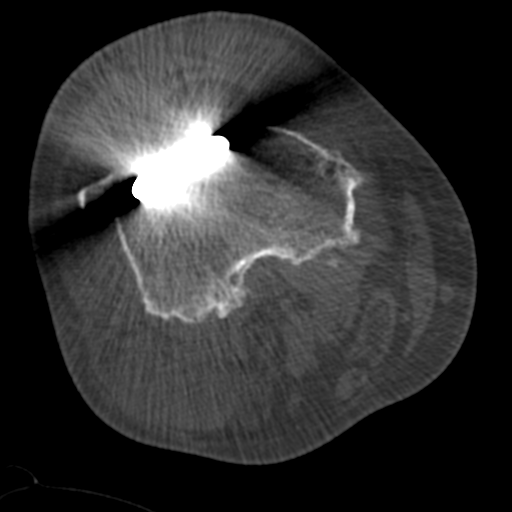
[im 100/166  bone]
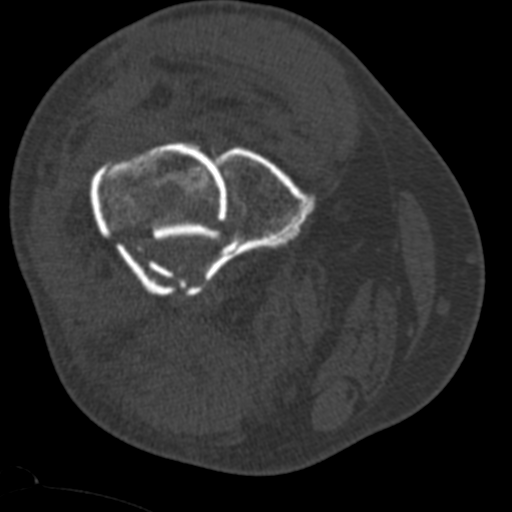
[im 116/166  bone]
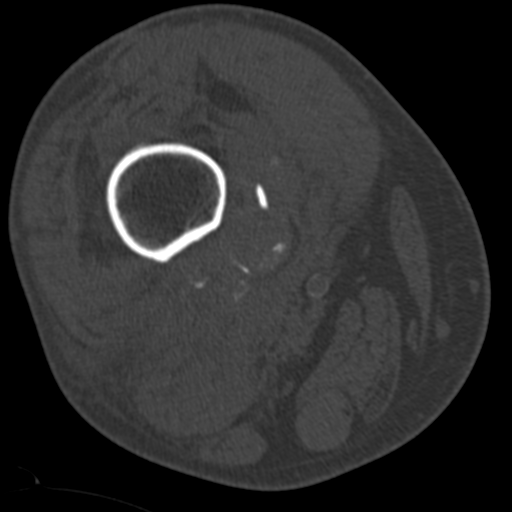
[im 133/166  bone]
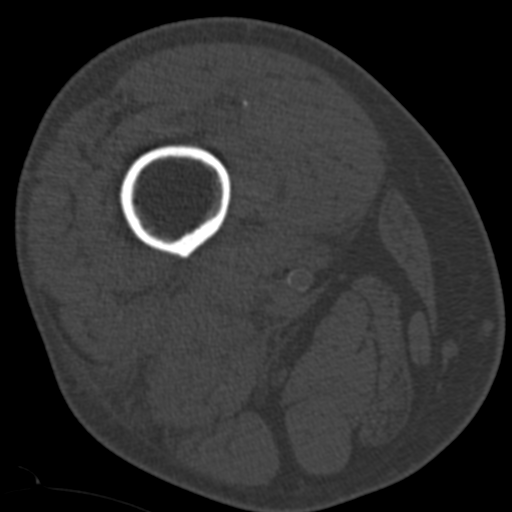
[im 149/166  soft-tissue]
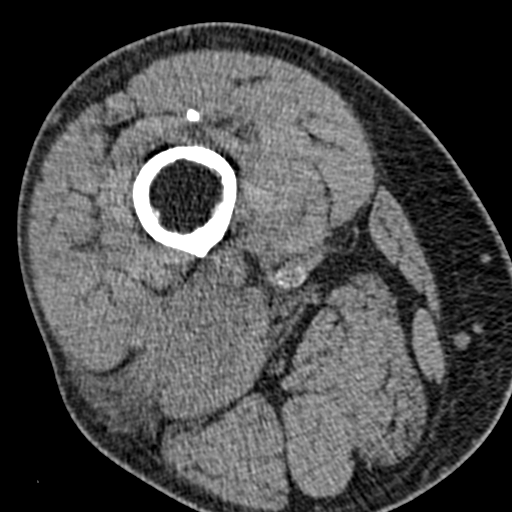
[im 149/166  bone]
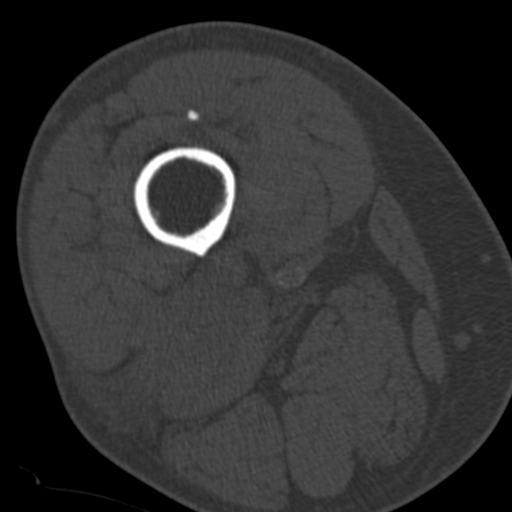

[Series 8: coronal st · coronal · 0.52mm/px · 2 of 201 slices shown]
[im 67/201  bone]
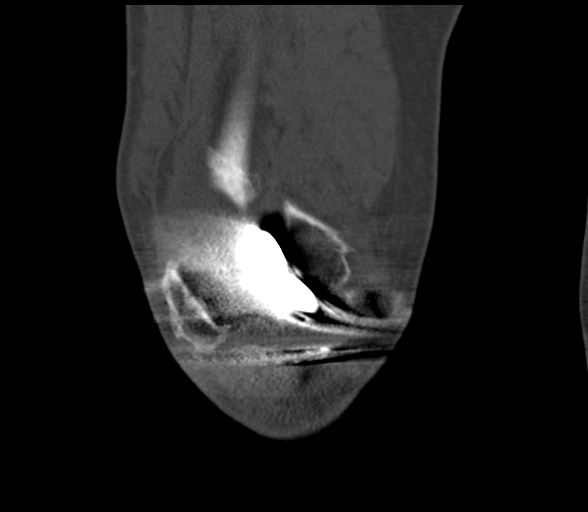
[im 134/201  bone]
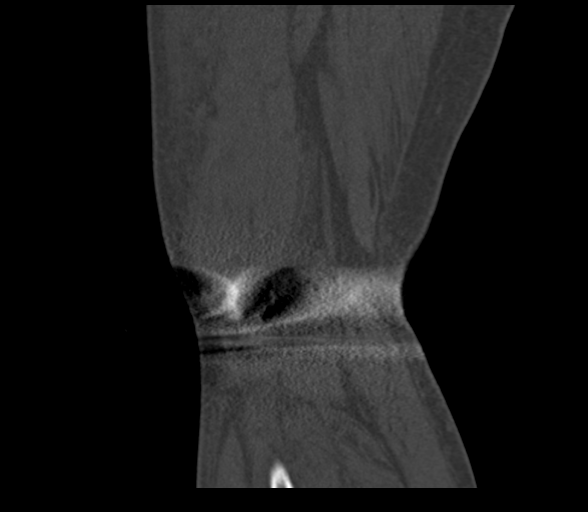

[Series 9: sagittal st · sagittal · 0.53mm/px · 3 of 161 slices shown]
[im 41/161  bone]
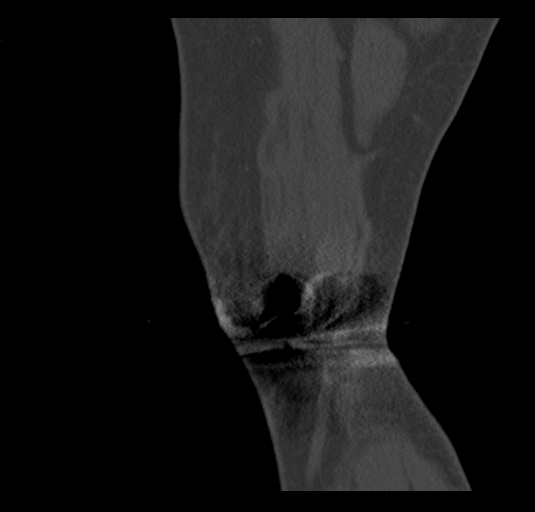
[im 81/161  bone]
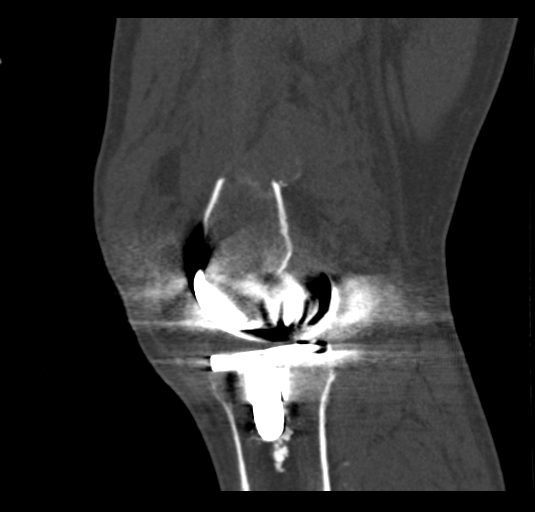
[im 121/161  bone]
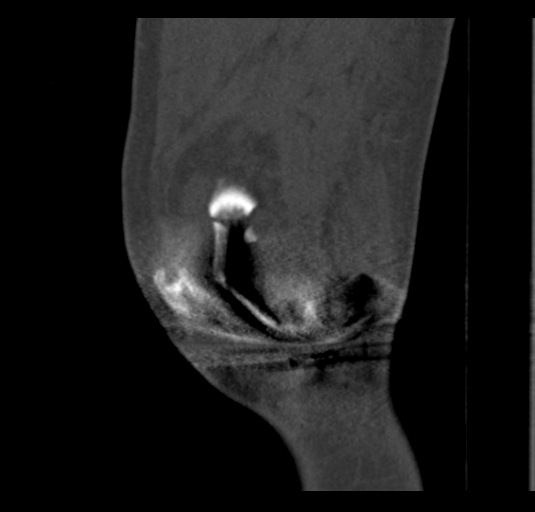

[14 of 33 positions shown; findings below may reference images not displayed]

FINDINGS: Comminuted and posteromedially displaced and tilted fracture through
the distal femur just above the femoral condyles in the anterior
aspect of the femoral components of the knee prosthesis. The distal
fracture fragment is displaced by approximately 2.6 cm. The patella
and tibia remain aligned with the femoral condyles.

Moderate associated hemarthrosis and extensive surrounding soft
tissue swelling. Streak artifact from the arthroplasty prosthesis
slightly limits evaluation of fine osseous detail. No definite
evidence of periprosthetic fracture along the tibial component. The
visualized fibula is within normal limits. Atherosclerotic
calcifications are present in the runoff arteries.
IMPRESSION: 1. Comminuted and posteromedially displaced distal femoral
periprosthetic fracture. Maximal displacement measures approximately
2.6 cm.
2. The patella and tibia remain aligned with the femoral condyles.
3. Moderate associated hemarthrosis and surrounding soft tissue
swelling.
4. Atherosclerotic vascular calcifications.

## 2019-04-04 IMAGING — DX DG KNEE COMPLETE 4+V*R*
4 series · 4 of 4 positions shown · non-contrast
Comparison: 01/18/2017

CLINICAL DATA: Felt a right knee with generalized right knee pain.
Knee replacement performed on 01/18/2017.

EXAM:
RIGHT KNEE - COMPLETE 4+ VIEW

[knee ap]
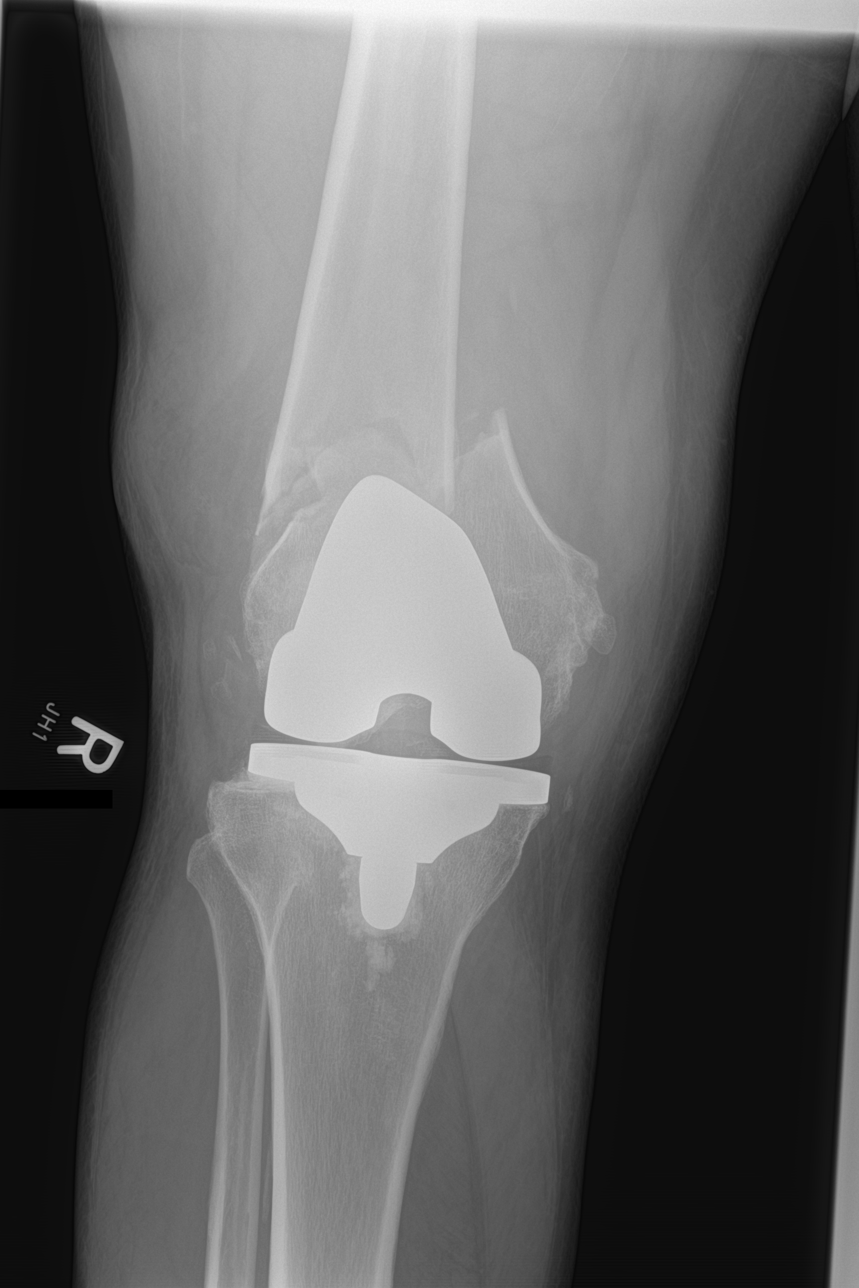

[knee lat]
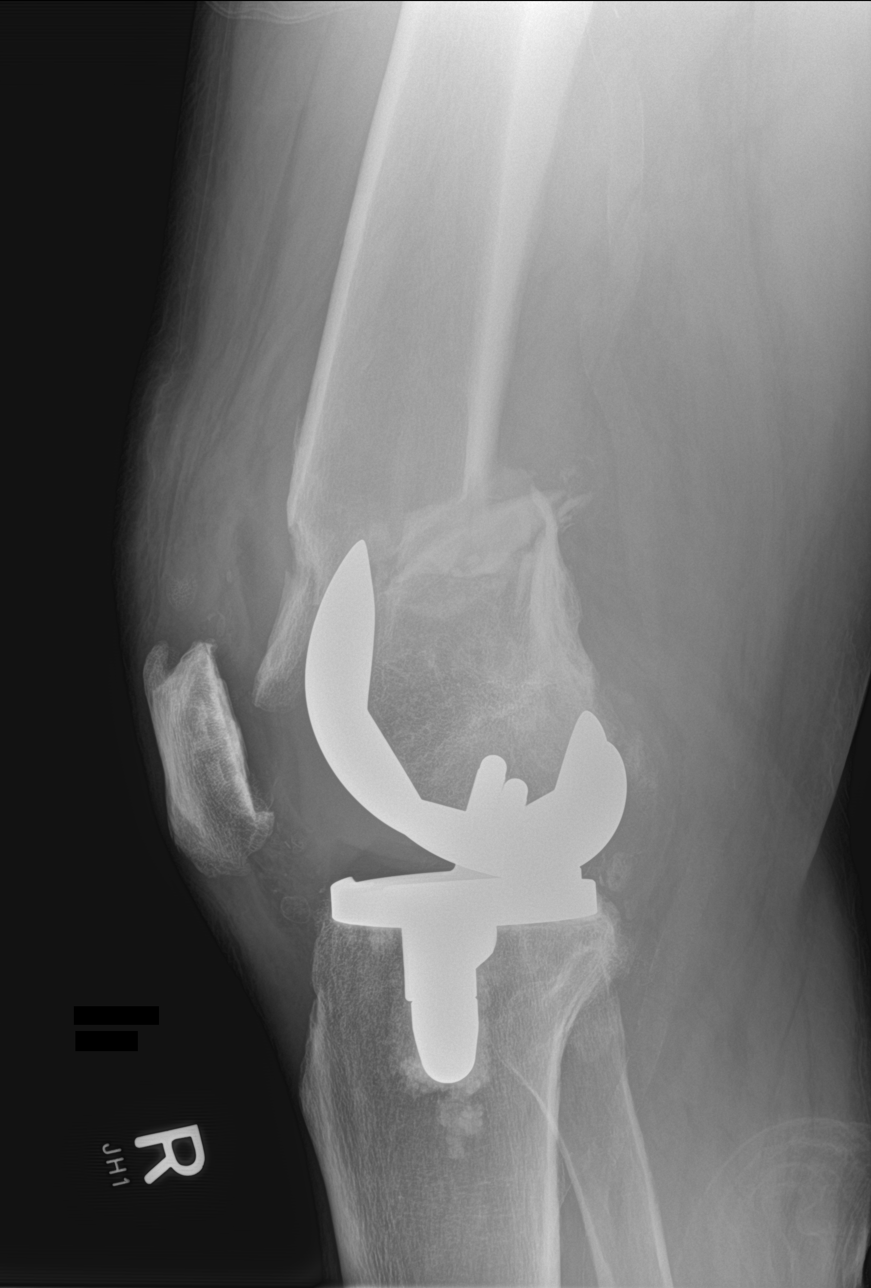

[knee obl (1 of 2)]
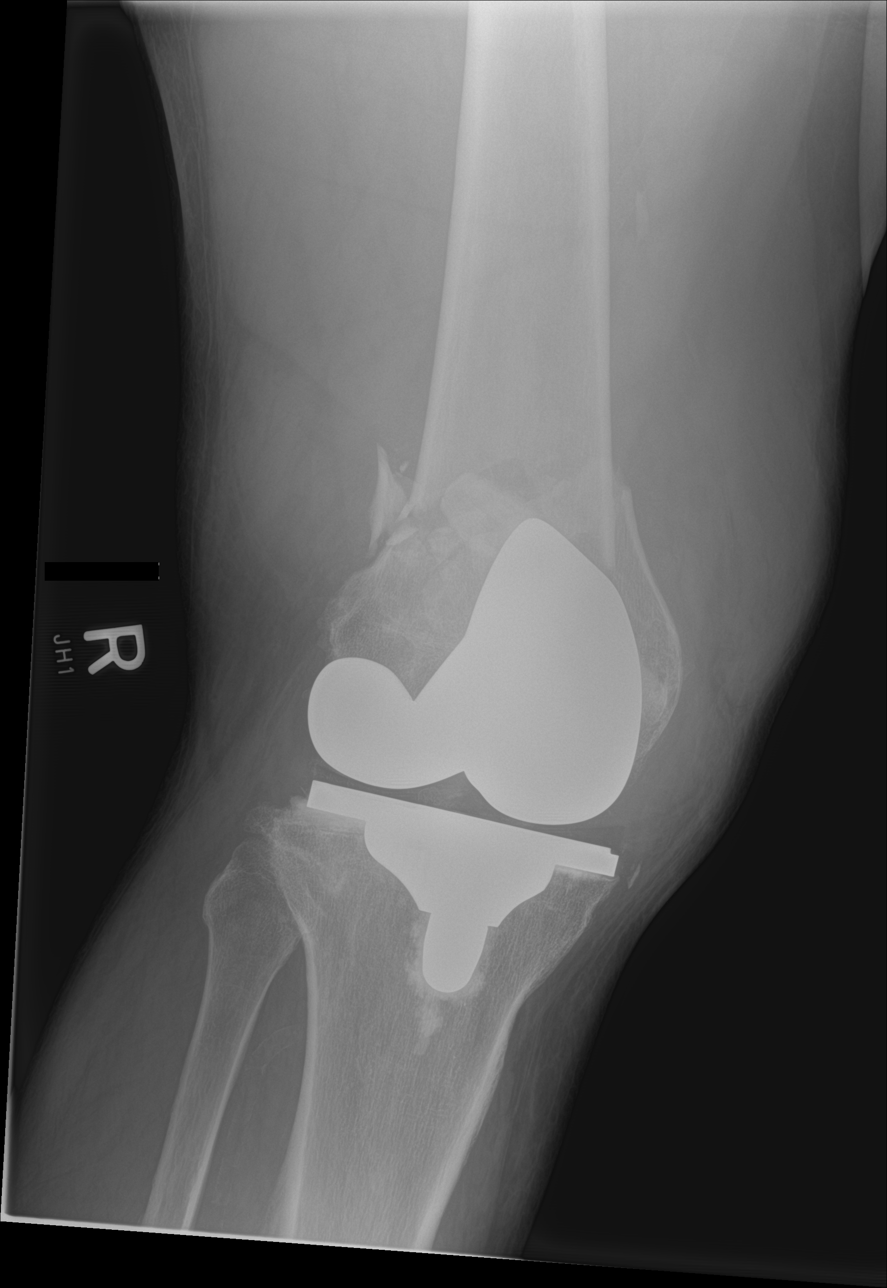

[knee obl (2 of 2)]
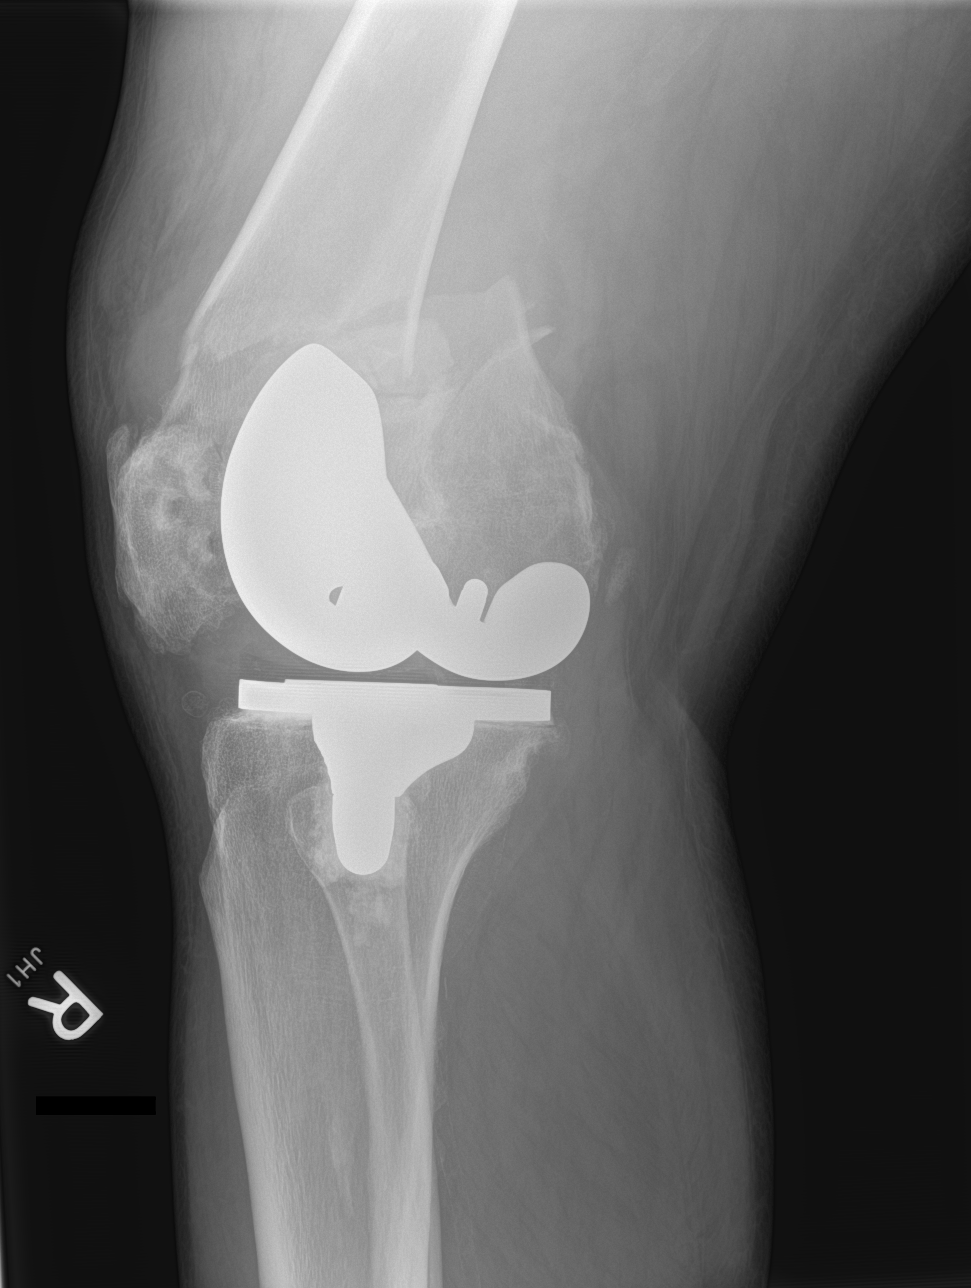

[4 of 4 positions shown; findings below may reference images not displayed]

FINDINGS: There is a comminuted fracture of the distal femoral metaphysis just
above the femoral component of the right knee prosthesis. Primary
fracture is oblique transverse in orientation. The distal fracture
component has displaced posteriorly by 2 cm.

No other fractures. The knee prosthetic components remain
well-seated and aligned.

There is a joint effusion distending the suprapatellar joint
capsule.

Surrounding soft tissue swelling is noted.
IMPRESSION: 1. Comminuted fracture of the distal right femur just above the
femoral component of the right knee prosthesis. Distal fracture
component is displaced posteriorly by 2 cm.
2. No other fractures.  No dislocation.

## 2019-06-27 ENCOUNTER — Other Ambulatory Visit: Payer: Self-pay | Admitting: *Deleted

## 2019-06-27 DIAGNOSIS — Z20822 Contact with and (suspected) exposure to covid-19: Secondary | ICD-10-CM

## 2019-06-30 LAB — NOVEL CORONAVIRUS, NAA: SARS-CoV-2, NAA: NOT DETECTED
